# Patient Record
Sex: Female | Born: 1960 | Race: Black or African American | Hispanic: No | Smoking: Current every day smoker
Health system: Southern US, Community
[De-identification: ages and names within clinical notes are randomized; demographics above are authoritative.]

## PROBLEM LIST (undated history)

## (undated) DIAGNOSIS — G5600 Carpal tunnel syndrome, unspecified upper limb: Secondary | ICD-10-CM

## (undated) DIAGNOSIS — J45909 Unspecified asthma, uncomplicated: Secondary | ICD-10-CM

## (undated) DIAGNOSIS — F1020 Alcohol dependence, uncomplicated: Secondary | ICD-10-CM

## (undated) DIAGNOSIS — I10 Essential (primary) hypertension: Secondary | ICD-10-CM

## (undated) HISTORY — PX: INDUCED ABORTION: SHX677

## (undated) HISTORY — DX: Alcohol dependence, uncomplicated: F10.20

---

## 2012-09-12 ENCOUNTER — Encounter (HOSPITAL_COMMUNITY): Payer: Self-pay

## 2012-09-12 ENCOUNTER — Emergency Department (INDEPENDENT_AMBULATORY_CARE_PROVIDER_SITE_OTHER)
Admission: EM | Admit: 2012-09-12 | Discharge: 2012-09-12 | Disposition: A | Discharge status: Home / Self Care | Source: Home / Self Care | Attending: Emergency Medicine | Admitting: Emergency Medicine

## 2012-09-12 DIAGNOSIS — G5601 Carpal tunnel syndrome, right upper limb: Secondary | ICD-10-CM

## 2012-09-12 DIAGNOSIS — G5600 Carpal tunnel syndrome, unspecified upper limb: Secondary | ICD-10-CM

## 2012-09-12 HISTORY — DX: Essential (primary) hypertension: I10

## 2012-09-12 MED ORDER — MELOXICAM 7.5 MG PO TABS: 7.5000 mg | ORAL_TABLET | Freq: Every day | ORAL | Status: DC

## 2012-09-12 NOTE — ED Provider Notes (Addendum)
History     CSN: [id number]  Arrival date & time 09/12/12  1123   First MD Initiated Contact with Patient 09/12/12 1129      Chief Complaint  Patient presents with  . Hand Problem    (Consider location/radiation/quality/duration/timing/severity/associated sxs/prior treatment) HPI Comments: Patient presents urgent care this afternoon describing that after she was carrying a heavy bag of ham in it she says that has been expressing pain and numbness it comes and goes extending from her right wrist to the first 3 fingers. This sensation comes and goes and sometimes will shoot up upwards from her wrist area. She denies any falls, injuries and denies a history of any wrist tendinitis. She does work as a Firefighter. Patient denies any further symptoms such as, fever, chills, bodyaches as in arthralgias or myalgias. Patient also denies any systemic symptoms such as unintentional weight loss or changes in appetite or fever. She has taken a couple of Advil over-the-counter within the last 2 weeks.  Patient is a 52 y.o. female presenting with hand pain. The history is provided by the patient.  Hand Pain This is a new problem. The current episode started more than 1 week ago. The problem occurs constantly. The problem has been gradually worsening. Pertinent negatives include no headaches. Exacerbated by: Flexing and moving her wrist around and making a fist. The symptoms are relieved by rest. She has tried acetaminophen for the symptoms. The treatment provided no relief.    Past Medical History  Diagnosis Date  . Hypertension     History reviewed. No pertinent past surgical history.  History reviewed. No pertinent family history.  History  Substance Use Topics  . Smoking status: Not on file  . Smokeless tobacco: Not on file  . Alcohol Use:     OB History    Grav Para Term Preterm Abortions TAB SAB Ect Mult Living                  Review of Systems  Constitutional: Positive for activity  change. Negative for fever, chills, appetite change, fatigue and unexpected weight change.  Musculoskeletal: Positive for arthralgias. Negative for myalgias, back pain, joint swelling and gait problem.  Skin: Negative for color change, rash and wound.  Neurological: Positive for numbness. Negative for dizziness, tremors, facial asymmetry, weakness and headaches.    Allergies  Azithromycin  Home Medications   Current Outpatient Rx  Name  Route  Sig  Dispense  Refill  . MELOXICAM 7.5 MG PO TABS   Oral   Take 1 tablet (7.5 mg total) by mouth daily. Take one tablet daily for 2 weeks   14 tablet   0     BP 134/88  Pulse 65  Temp 97.8 F (36.6 C) (Oral)  Resp 16  SpO2 100%  Physical Exam  Nursing note and vitals reviewed. Constitutional: Vital signs are normal. She appears well-developed and well-nourished.  Non-toxic appearance. She does not have a sickly appearance.  Musculoskeletal: She exhibits tenderness.       Hands: Neurological: She is alert.  Skin: No rash noted. No erythema.    ED Course  Procedures (including critical care time)  Labs Reviewed - No data to display No results found.   1. Carpal tunnel syndrome of right wrist       MDM  Carpal total syndrome ( non-trauma right hand pain). Patient was splinted with a wrist Velcro splint. Prescribe a course of meloxicam for 2 weeks and in instructed to followup  with a hand orthopedic Dr. for further treatment modalities. Patient has no neuromuscular deficits. As she's able to abduct and adduct her fingers, pulses and capillary refill within normal.     Jimmie Molly, MD 09/12/12 1251  Jimmie Molly, MD 09/12/12 1308  Jimmie Molly, MD 09/12/12 1309

## 2012-09-12 NOTE — ED Notes (Signed)
States she had onset of right hand pain and numbness on 12-24, after carrying ham ; hand pain and numbness comes and goes, sometimes extends to mid upper arm, hard to use her hand due to numbness; no other known issues

## 2013-07-22 ENCOUNTER — Emergency Department (HOSPITAL_COMMUNITY)
Admission: EM | Admit: 2013-07-22 | Discharge: 2013-07-22 | Disposition: A | Discharge status: Home / Self Care | Attending: Emergency Medicine | Admitting: Emergency Medicine

## 2013-07-22 ENCOUNTER — Emergency Department (HOSPITAL_COMMUNITY)

## 2013-07-22 ENCOUNTER — Encounter (HOSPITAL_COMMUNITY): Payer: Self-pay | Admitting: Emergency Medicine

## 2013-07-22 DIAGNOSIS — M5430 Sciatica, unspecified side: Secondary | ICD-10-CM | POA: Insufficient documentation

## 2013-07-22 DIAGNOSIS — R079 Chest pain, unspecified: Secondary | ICD-10-CM | POA: Insufficient documentation

## 2013-07-22 DIAGNOSIS — Z79899 Other long term (current) drug therapy: Secondary | ICD-10-CM | POA: Insufficient documentation

## 2013-07-22 DIAGNOSIS — M5432 Sciatica, left side: Secondary | ICD-10-CM

## 2013-07-22 DIAGNOSIS — F17200 Nicotine dependence, unspecified, uncomplicated: Secondary | ICD-10-CM | POA: Insufficient documentation

## 2013-07-22 DIAGNOSIS — IMO0002 Reserved for concepts with insufficient information to code with codable children: Secondary | ICD-10-CM | POA: Insufficient documentation

## 2013-07-22 DIAGNOSIS — I10 Essential (primary) hypertension: Secondary | ICD-10-CM | POA: Insufficient documentation

## 2013-07-22 MED ORDER — OXYCODONE-ACETAMINOPHEN 5-325 MG PO TABS
1.0000 | ORAL_TABLET | Freq: Once | ORAL | Status: AC
  Administered 2013-07-22: 1 via ORAL
  Filled 2013-07-22: qty 1

## 2013-07-22 MED ORDER — PREDNISONE 5 MG PO TABS: 20.0000 mg | ORAL_TABLET | Freq: Every day | ORAL | Status: DC

## 2013-07-22 MED ORDER — OXYCODONE-ACETAMINOPHEN 5-325 MG PO TABS: 1.0000 | ORAL_TABLET | Freq: Four times a day (QID) | ORAL | Status: DC | PRN

## 2013-07-22 MED ORDER — CYCLOBENZAPRINE HCL 10 MG PO TABS: 10.0000 mg | ORAL_TABLET | Freq: Two times a day (BID) | ORAL | Status: DC | PRN

## 2013-07-22 MED ORDER — PREDNISONE 20 MG PO TABS
60.0000 mg | ORAL_TABLET | Freq: Once | ORAL | Status: AC
  Administered 2013-07-22: 60 mg via ORAL
  Filled 2013-07-22: qty 3

## 2013-07-22 MED ORDER — PREDNISONE 20 MG PO TABS: 60.0000 mg | ORAL_TABLET | Freq: Once | ORAL | Status: DC

## 2013-07-22 MED ORDER — HYDROMORPHONE HCL PF 1 MG/ML IJ SOLN: 1.0000 mg | Freq: Once | INTRAMUSCULAR | Status: DC

## 2013-07-22 MED ORDER — DIAZEPAM 5 MG PO TABS
5.0000 mg | ORAL_TABLET | Freq: Once | ORAL | Status: AC
  Administered 2013-07-22: 5 mg via ORAL
  Filled 2013-07-22: qty 1

## 2013-07-22 NOTE — ED Notes (Signed)
Pt has returned from being out of the department 

## 2013-07-22 NOTE — ED Notes (Signed)
Pt transported to Radiology Department

## 2013-07-22 NOTE — ED Provider Notes (Signed)
CSN: [id number]     Arrival date & time 07/22/13  4098 History   First MD Initiated Contact with Patient 07/22/13 [phone]     Chief Complaint  Patient presents with  . Back Pain   (Consider location/radiation/quality/duration/timing/severity/associated sxs/prior Treatment) Patient is a 52 y.o. female presenting with back pain. The history is provided by the patient. No language interpreter was used.  Back Pain Location:  Sacro-iliac joint Quality:  Stabbing Radiates to:  Does not radiate Pain severity:  Severe Pain is:  Same all the time Duration:  1 week Timing:  Constant Progression:  Worsening Chronicity:  New Context: not falling, not physical stress, not recent illness and not recent injury   Relieved by:  Nothing Associated symptoms: no abdominal pain, no bladder incontinence, no bowel incontinence, no chest pain, no dysuria, no fever, no leg pain, no numbness, no paresthesias, no tingling and no weakness   Risk factors: not obese and no recent surgery     Kelsey Poole is a 52 y.o.female with a significant PMH of hypertension presents to the ER with complaints of back pain x 1 week. Patient states there is no recent injury. Pain is sharp and stabbing worse with any movement, 9/10. Denies any radiating symptoms into legs, no weakness/numbness/tingling, no lose of bowel or bladder control, no recent fever/chills, no recent illness.  No history of back surgery and never had this type of pain before. Needs referral to PCP. She also complains of rib pain that has persisted for the past 5 years. She had it evaluated in 2010 but is afraid she has lung cancer since she is a smoker. She has been homeless for a long time and just recently has a place to live.    Past Medical History  Diagnosis Date  . Hypertension    History reviewed. No pertinent past surgical history. No family history on file. History  Substance Use Topics  . Smoking status: Current Every Day Smoker  . Smokeless  tobacco: Not on file  . Alcohol Use: Yes   OB History   Grav Para Term Preterm Abortions TAB SAB Ect Mult Living                 Review of Systems  Constitutional: Negative for fever.  Cardiovascular: Negative for chest pain.  Gastrointestinal: Negative for abdominal pain and bowel incontinence.  Genitourinary: Negative for bladder incontinence and dysuria.  Musculoskeletal: Positive for back pain.  Neurological: Negative for tingling, weakness, numbness and paresthesias.    Allergies  Azithromycin  Home Medications   Current Outpatient Rx  Name  Route  Sig  Dispense  Refill  . albuterol (PROVENTIL HFA;VENTOLIN HFA) 108 (90 BASE) MCG/ACT inhaler   Inhalation   Inhale 2 puffs into the lungs every 6 (six) hours as needed for wheezing or shortness of breath.         . loratadine (CLARITIN) 10 MG tablet   Oral   Take 10 mg by mouth daily as needed for allergies.         . cyclobenzaprine (FLEXERIL) 10 MG tablet   Oral   Take 1 tablet (10 mg total) by mouth 2 (two) times daily as needed for muscle spasms.   30 tablet   0   . oxyCODONE-acetaminophen (PERCOCET/ROXICET) 5-325 MG per tablet   Oral   Take 1-2 tablets by mouth every 6 (six) hours as needed for severe pain.   15 tablet   0   . predniSONE (DELTASONE) 5 MG  tablet   Oral   Take 4 tablets (20 mg total) by mouth daily.   24 tablet   0     Prednisone dose pack directions:   8 tabs on day ...    BP 153/102  Pulse 68  Temp(Src) 97.7 F (36.5 C) (Oral)  Resp 14  SpO2 100% Physical Exam  Nursing note and vitals reviewed. Constitutional: She appears well-developed and well-nourished. No distress.  HENT:  Head: Normocephalic and atraumatic.  Eyes: Pupils are equal, round, and reactive to light.  Neck: Normal range of motion. Neck supple.  Cardiovascular: Normal rate and regular rhythm.   Pulmonary/Chest: Effort normal.  Abdominal: Soft.  Musculoskeletal:       Lumbar back: She exhibits decreased  range of motion, tenderness, pain and spasm. She exhibits no swelling.       Back:   Equal strength to bilateral lower extremities. Neurosensory function adequate to both legs. Skin color is normal. Skin is warm and moist. I see no step off deformity, no bony tenderness. Pt is able to ambulate without limp. Pain is relieved when sitting in certain positions. ROM is decreased due to pain. No crepitus, laceration, effusion, swelling.  Pulses are normal   Neurological: She is alert.  Skin: Skin is warm and dry.    ED Course  Procedures (including critical care time) Labs Review Labs Reviewed - No data to display Imaging Review Dg Chest 2 View  07/22/2013   CLINICAL DATA:  Left lower anterior chest pain, no injury, history hypertension  EXAM: CHEST  2 VIEW  COMPARISON:  None  FINDINGS: Normal heart size, mediastinal contours, and pulmonary vascularity.  Lungs clear.  Bones unremarkable.  No pneumothorax.  IMPRESSION: Normal exam.   Electronically Signed   By: Ulyses Southward M.D.   On: 07/22/2013 10:24   Dg Lumbar Spine Complete  07/22/2013   CLINICAL DATA:  Left low back pain  EXAM: LUMBAR SPINE - COMPLETE 4+ VIEW  COMPARISON:  None  FINDINGS: 4 non-rib-bearing lumbar type vertebrae with incomplete sacralization of L5.  Vertebral body and disc space heights maintained.  No acute fracture, subluxation or bone destruction.  No spondylolysis.  SI joints symmetric.  Minimal atherosclerotic calcification aorta.  IMPRESSION: No acute osseous findings.   Electronically Signed   By: Ulyses Southward M.D.   On: 07/22/2013 10:25    EKG Interpretation   None       MDM   1. Sciatica neuralgia, left    52 y.o.Kelsey Poole's  with back pain. No neurological deficits and normal neuro exam. Patient can walk but states is painful. No loss of bowel or bladder control. No concern for cauda equina. No fever, night sweats, weight loss, h/o cancer, IVDU. RICE protocol and pain medicine indicated and discussed with  patient.   Patient Plan 1. Medications: narcotic pain medication, muscle relaxer and usual home medications  2. Treatment: rest, drink plenty of fluids, gentle stretching as discussed, alternate ice and heat  3. Follow Up: Please followup with your primary doctor for discussion of your diagnoses and further evaluation after today's visit; if you do not have a primary care doctor use the resource guide provided to find one   Vital signs are stable at discharge. Filed Vitals:   07/22/13 0945  BP: 153/102  Pulse: 68  Temp:   Resp: 14    Patient/guardian has voiced understanding and agreed to follow-up with the PCP or specialist.         Elmarie Shiley  Irine Seal, PA-C 07/22/13 1102

## 2013-07-22 NOTE — ED Notes (Signed)
Patient transported to X-ray 
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
## 2013-07-28 NOTE — ED Provider Notes (Signed)
Medical screening examination/treatment/procedure(s) were performed by non-physician practitioner and as supervising physician I was immediately available for consultation/collaboration.  EKG Interpretation    Date/Time:  Tuesday July 22 2013 09:06:07 EST Ventricular Rate:  77 PR Interval:  134 QRS Duration: 76 QT Interval:  380 QTC Calculation: 430 R Axis:   77 Text Interpretation:  Normal sinus rhythm Normal ECG ED PHYSICIAN INTERPRETATION AVAILABLE IN [facility] Confirmed by TEST, RECORD ([id number]) on 07/24/2013 8:51:06 AM             Juliet Rude. Rubin Payor, MD 07/28/13 1547

## 2014-03-06 ENCOUNTER — Emergency Department (HOSPITAL_COMMUNITY)

## 2014-03-06 ENCOUNTER — Emergency Department (HOSPITAL_COMMUNITY)
Admission: EM | Admit: 2014-03-06 | Discharge: 2014-03-06 | Disposition: A | Discharge status: Home / Self Care | Attending: Emergency Medicine | Admitting: Emergency Medicine

## 2014-03-06 ENCOUNTER — Encounter (HOSPITAL_COMMUNITY): Payer: Self-pay | Admitting: Emergency Medicine

## 2014-03-06 DIAGNOSIS — R1011 Right upper quadrant pain: Secondary | ICD-10-CM | POA: Insufficient documentation

## 2014-03-06 DIAGNOSIS — Z79899 Other long term (current) drug therapy: Secondary | ICD-10-CM | POA: Insufficient documentation

## 2014-03-06 DIAGNOSIS — IMO0002 Reserved for concepts with insufficient information to code with codable children: Secondary | ICD-10-CM | POA: Insufficient documentation

## 2014-03-06 DIAGNOSIS — K2970 Gastritis, unspecified, without bleeding: Secondary | ICD-10-CM

## 2014-03-06 DIAGNOSIS — F17200 Nicotine dependence, unspecified, uncomplicated: Secondary | ICD-10-CM | POA: Insufficient documentation

## 2014-03-06 DIAGNOSIS — I10 Essential (primary) hypertension: Secondary | ICD-10-CM | POA: Insufficient documentation

## 2014-03-06 DIAGNOSIS — Z3202 Encounter for pregnancy test, result negative: Secondary | ICD-10-CM | POA: Insufficient documentation

## 2014-03-06 DIAGNOSIS — N390 Urinary tract infection, site not specified: Secondary | ICD-10-CM | POA: Insufficient documentation

## 2014-03-06 DIAGNOSIS — K2990 Gastroduodenitis, unspecified, without bleeding: Secondary | ICD-10-CM

## 2014-03-06 LAB — URINE MICROSCOPIC-ADD ON

## 2014-03-06 LAB — CBC WITH DIFFERENTIAL/PLATELET
Basophils Absolute: 0 10*3/uL (ref 0.0–0.1)
Basophils Relative: 0 % (ref 0–1)
EOS ABS: 0.1 10*3/uL (ref 0.0–0.7)
EOS PCT: 1 % (ref 0–5)
HCT: 43.1 % (ref 36.0–46.0)
HEMOGLOBIN: 15 g/dL (ref 12.0–15.0)
LYMPHS ABS: 3.4 10*3/uL (ref 0.7–4.0)
Lymphocytes Relative: 30 % (ref 12–46)
MCH: 33.5 pg (ref 26.0–34.0)
MCHC: 34.8 g/dL (ref 30.0–36.0)
MCV: 96.2 fL (ref 78.0–100.0)
MONO ABS: 0.5 10*3/uL (ref 0.1–1.0)
MONOS PCT: 5 % (ref 3–12)
Neutro Abs: 7.5 10*3/uL (ref 1.7–7.7)
Neutrophils Relative %: 64 % (ref 43–77)
Platelets: 268 10*3/uL (ref 150–400)
RBC: 4.48 MIL/uL (ref 3.87–5.11)
RDW: 13.9 % (ref 11.5–15.5)
WBC: 11.5 10*3/uL — ABNORMAL HIGH (ref 4.0–10.5)

## 2014-03-06 LAB — COMPREHENSIVE METABOLIC PANEL
ALK PHOS: 116 U/L (ref 39–117)
ALT: 26 U/L (ref 0–35)
ANION GAP: 20 — AB (ref 5–15)
AST: 36 U/L (ref 0–37)
Albumin: 4.3 g/dL (ref 3.5–5.2)
BUN: 18 mg/dL (ref 6–23)
CALCIUM: 10 mg/dL (ref 8.4–10.5)
CO2: 22 mEq/L (ref 19–32)
Chloride: 94 mEq/L — ABNORMAL LOW (ref 96–112)
Creatinine, Ser: 1.36 mg/dL — ABNORMAL HIGH (ref 0.50–1.10)
GFR calc non Af Amer: 44 mL/min — ABNORMAL LOW (ref >90)
GFR, EST AFRICAN AMERICAN: 50 mL/min — AB (ref >90)
GLUCOSE: 97 mg/dL (ref 70–99)
Potassium: 3.7 mEq/L (ref 3.7–5.3)
Sodium: 136 mEq/L — ABNORMAL LOW (ref 137–147)
TOTAL PROTEIN: 7.9 g/dL (ref 6.0–8.3)
Total Bilirubin: 0.8 mg/dL (ref 0.3–1.2)

## 2014-03-06 LAB — URINALYSIS, ROUTINE W REFLEX MICROSCOPIC
Bilirubin Urine: NEGATIVE
Glucose, UA: NEGATIVE mg/dL
HGB URINE DIPSTICK: NEGATIVE
Ketones, ur: 15 mg/dL — AB
Nitrite: POSITIVE — AB
PROTEIN: NEGATIVE mg/dL
Specific Gravity, Urine: 1.006 (ref 1.005–1.030)
UROBILINOGEN UA: 1 mg/dL (ref 0.0–1.0)
pH: 7 (ref 5.0–8.0)

## 2014-03-06 LAB — POC URINE PREG, ED: PREG TEST UR: NEGATIVE

## 2014-03-06 LAB — LIPASE, BLOOD: Lipase: 126 U/L — ABNORMAL HIGH (ref 11–59)

## 2014-03-06 MED ORDER — IOHEXOL 300 MG/ML  SOLN
100.0000 mL | Freq: Once | INTRAMUSCULAR | Status: AC | PRN
  Administered 2014-03-06: 100 mL via INTRAVENOUS

## 2014-03-06 MED ORDER — OXYCODONE-ACETAMINOPHEN 5-325 MG PO TABS
1.0000 | ORAL_TABLET | Freq: Once | ORAL | Status: AC
  Administered 2014-03-06: 1 via ORAL
  Filled 2014-03-06: qty 1

## 2014-03-06 MED ORDER — SUCRALFATE 1 GM/10ML PO SUSP: 1.0000 g | Freq: Three times a day (TID) | ORAL | Status: DC

## 2014-03-06 MED ORDER — CEPHALEXIN 500 MG PO CAPS: 500.0000 mg | ORAL_CAPSULE | Freq: Two times a day (BID) | ORAL | Status: DC

## 2014-03-06 MED ORDER — SODIUM CHLORIDE 0.9 % IV BOLUS (SEPSIS)
1000.0000 mL | INTRAVENOUS | Status: AC
  Administered 2014-03-06: 1000 mL via INTRAVENOUS

## 2014-03-06 MED ORDER — PANTOPRAZOLE SODIUM 20 MG PO TBEC: 40.0000 mg | DELAYED_RELEASE_TABLET | Freq: Every day | ORAL | Status: DC

## 2014-03-06 MED ORDER — IOHEXOL 300 MG/ML  SOLN: 25.0000 mL | Freq: Once | INTRAMUSCULAR | Status: DC | PRN

## 2014-03-06 MED ORDER — ONDANSETRON HCL 4 MG/2ML IJ SOLN
4.0000 mg | Freq: Once | INTRAMUSCULAR | Status: AC
  Administered 2014-03-06: 4 mg via INTRAVENOUS
  Filled 2014-03-06: qty 2

## 2014-03-06 MED ORDER — ONDANSETRON 4 MG PO TBDP
4.0000 mg | ORAL_TABLET | Freq: Once | ORAL | Status: AC
  Administered 2014-03-06: 4 mg via ORAL
  Filled 2014-03-06: qty 1

## 2014-03-06 MED ORDER — HYDROMORPHONE HCL PF 1 MG/ML IJ SOLN
1.0000 mg | INTRAMUSCULAR | Status: AC
  Administered 2014-03-06: 1 mg via INTRAVENOUS
  Filled 2014-03-06: qty 1

## 2014-03-06 MED ORDER — ONDANSETRON HCL 4 MG PO TABS: 4.0000 mg | ORAL_TABLET | Freq: Four times a day (QID) | ORAL | Status: DC

## 2014-03-06 MED ORDER — DEXTROSE 5 % IV SOLN
1.0000 g | Freq: Once | INTRAVENOUS | Status: AC
  Administered 2014-03-06: 1 g via INTRAVENOUS
  Filled 2014-03-06: qty 10

## 2014-03-06 NOTE — ED Notes (Addendum)
[facility] with c/o abdominal pain associated with nausea and vomiting. Last normal stool on Tuesday, vomited green on Tuesday and clear white on Wednesday. Pt aslo c/o epigastric pain. Per EMS, BP-154/98, HR-93, O2-100% on room air.

## 2014-03-06 NOTE — ED Provider Notes (Signed)
2300 - Patient care assumed from Dr. Purvis SheffieldForrest Harrison at shift change at 2000. Patient with epigastric and RUQ pain. W/u at shift change pending to r/u gallbladder etiology. Patient does have a hx of heavy and consistent ETOH consumption. CT today shows fatty liver with adequate distension of the gallbladder. U/S recommended which was obtained. U/S shows no evidence of acute cholecystitis today.   Patient has been hemodynamically stable throughout ED course without emesis. She is stable for d/c with Rx for Protonix and Carafate as well as Zofran. Suspect symptoms today are secondary to gastritis. ETOH cessation advised. Will tx UTI with Keflex. Return precautions provided and patient agreeable to plan with no unaddressed concerns.  Filed Vitals:   03/06/14 2200 03/06/14 2254 03/06/14 2255 03/06/14 2304  BP: 156/86  106/94 106/70  Pulse: 68     Temp:   98 F (36.7 C) 98 F (36.7 C)  TempSrc:   Oral Oral  Resp: 23  22 20   SpO2: 99% 100% 100% 95%     Antony MaduraKelly Jedrick Hutcherson, PA-C 03/06/14 2308

## 2014-03-06 NOTE — ED Provider Notes (Signed)
CSN: [id number]     Arrival date & time 03/06/14  1849 History   First MD Initiated Contact with Patient 03/06/14 1906     Chief Complaint  Patient presents with  . Abdominal Pain     (Consider location/radiation/quality/duration/timing/severity/associated sxs/prior Treatment) Patient is a 53 y.o. female presenting with abdominal pain. The history is provided by the patient.  Abdominal Pain Pain location:  Epigastric and RUQ Pain quality: aching   Pain radiates to:  Does not radiate Pain severity:  Moderate Onset quality:  Gradual Duration:  3 days Timing:  Constant Progression:  Worsening Chronicity:  New Context comment:  At rest Relieved by:  Nothing Worsened by:  Nothing tried Ineffective treatments:  None tried Associated symptoms: nausea and vomiting   Associated symptoms: no chest pain, no cough, no diarrhea, no dysuria, no fatigue, no fever, no hematuria and no shortness of breath     Past Medical History  Diagnosis Date  . Hypertension    History reviewed. No pertinent past surgical history. History reviewed. No pertinent family history. History  Substance Use Topics  . Smoking status: Current Every Day Smoker  . Smokeless tobacco: Not on file  . Alcohol Use: Yes   OB History   Grav Para Term Preterm Abortions TAB SAB Ect Mult Living                 Review of Systems  Constitutional: Negative for fever and fatigue.  HENT: Negative for congestion and drooling.   Eyes: Negative for pain.  Respiratory: Negative for cough and shortness of breath.   Cardiovascular: Negative for chest pain.  Gastrointestinal: Positive for nausea, vomiting and abdominal pain. Negative for diarrhea.  Genitourinary: Negative for dysuria and hematuria.  Musculoskeletal: Negative for back pain, gait problem and neck pain.  Skin: Negative for color change.  Neurological: Negative for dizziness and headaches.  Hematological: Negative for adenopathy.  Psychiatric/Behavioral:  Negative for behavioral problems.  All other systems reviewed and are negative.     Allergies  Azithromycin  Home Medications   Prior to Admission medications   Medication Sig Start Date End Date Taking? Authorizing Provider  fluticasone (FLOVENT HFA) 110 MCG/ACT inhaler Inhale 1 puff into the lungs 2 (two) times daily.   Yes Historical Provider, MD  lisinopril (PRINIVIL,ZESTRIL) 10 MG tablet Take 10 mg by mouth daily.   Yes Historical Provider, MD   There were no vitals taken for this visit. Physical Exam  Nursing note and vitals reviewed. Constitutional: She is oriented to person, place, and time. She appears well-developed and well-nourished.  HENT:  Head: Normocephalic.  Mouth/Throat: Oropharynx is clear and moist. No oropharyngeal exudate.  Eyes: Conjunctivae and EOM are normal. Pupils are equal, round, and reactive to light.  Neck: Normal range of motion. Neck supple.  Cardiovascular: Normal rate, regular rhythm, normal heart sounds and intact distal pulses.  Exam reveals no gallop and no friction rub.   No murmur heard. Pulmonary/Chest: Effort normal and breath sounds normal. No respiratory distress. She has no wheezes.  Abdominal: Soft. Bowel sounds are normal. There is tenderness (mild to moderate tenderness to palpation of the right upper quadrant and epigastric area.). There is no rebound and no guarding.  Musculoskeletal: Normal range of motion. She exhibits no edema and no tenderness.  Neurological: She is alert and oriented to person, place, and time.  Skin: Skin is warm and dry.  Psychiatric: She has a normal mood and affect. Her behavior is normal.    ED  Course  Procedures (including critical care time) Labs Review Labs Reviewed  CBC WITH DIFFERENTIAL - Abnormal; Notable for the following:    WBC 11.5 (*)    All other components within normal limits  COMPREHENSIVE METABOLIC PANEL - Abnormal; Notable for the following:    Sodium 136 (*)    Chloride 94 (*)     Creatinine, Ser 1.36 (*)    GFR calc non Af Amer 44 (*)    GFR calc Af Amer 50 (*)    Anion gap 20 (*)    All other components within normal limits  LIPASE, BLOOD - Abnormal; Notable for the following:    Lipase 126 (*)    All other components within normal limits  URINALYSIS, ROUTINE W REFLEX MICROSCOPIC - Abnormal; Notable for the following:    APPearance CLOUDY (*)    Ketones, ur 15 (*)    Nitrite POSITIVE (*)    Leukocytes, UA MODERATE (*)    All other components within normal limits  URINE MICROSCOPIC-ADD ON - Abnormal; Notable for the following:    Squamous Epithelial / LPF FEW (*)    Bacteria, UA MANY (*)    All other components within normal limits  POC URINE PREG, ED    Imaging Review [place] Abdomen Complete  03/06/2014   CLINICAL DATA:  Right upper quadrant pain. Evaluate for cholecystitis.  EXAM: ULTRASOUND ABDOMEN COMPLETE  COMPARISON:  None.  FINDINGS: Gallbladder:  No gallstones or wall thickening visualized. No sonographic Murphy sign noted.  Common bile duct:  Diameter: 5 mm.  Liver:  Mild increased echogenicity diffusely. No focal abnormality. Antegrade flow in the imaged portal venous system.  IVC:  No abnormality visualized.  Pancreas:  Visualized portion unremarkable.  Spleen:  Size and appearance within normal limits.  Right Kidney:  Length: 10 cm. Echogenicity within normal limits. No mass or hydronephrosis visualized.  Left Kidney:  Length: 10 cm. Echogenicity within normal limits. No mass or hydronephrosis visualized.  Abdominal aorta:  No aneurysm visualized.  Other findings:  None.  IMPRESSION: Mild hepatic steatosis.  Otherwise, negative exam.   Electronically Signed   By: Tiburcio PeaJonathan  Watts M.D.   On: 03/06/2014 22:38   Ct Abdomen Pelvis W Contrast  03/06/2014   CLINICAL DATA:  Epigastric and right upper quadrant discomfort  EXAM: CT ABDOMEN AND PELVIS WITH CONTRAST  TECHNIQUE: Multidetector CT imaging of the abdomen and pelvis was performed using the standard protocol  following bolus administration of intravenous contrast.  CONTRAST:  100mL OMNIPAQUE IOHEXOL 300 MG/ML SOLN intravenously; the patient also received oral contrast material.  COMPARISON:  None.  FINDINGS: The liver exhibits decreased density with no focal mass or ductal dilation. The gallbladder is adequately distended with no evidence of stones. The pancreas, spleen, adrenal glands, and kidneys are normal. The abdominal aorta is normal. The urinary bladder, uterus, and adnexal structures are normal for age. There is no ascites.  The stomach is moderately distended with the oral contrast. Only a small amount has entered the small bowel. There is no small or large bowel obstructive pattern. There is a normal appendix. There is no evidence of colitis. There are scattered sigmoid diverticula.  The lumbar spine and pelvis are unremarkable. The lung bases are clear.  IMPRESSION: 1. There are fatty infiltrative changes of the liver. No acute hepatobiliary abnormality is demonstrated. If there are clinical concerns of acute cholecystitis, gallbladder ultrasound would be useful. 2. There is no acute urinary tract or acute bowel abnormality. 3. There is  no ascites nor lymphadenopathy.   Electronically Signed   By: David  SwazilandJordan   On: 03/06/2014 21:04     EKG Interpretation None      MDM   Final diagnoses:  Gastritis  UTI (lower urinary tract infection)    7:20 PM 53 y.o. female who presents with upper abdominal pain which began 2 days ago after taking a Prevacid. She notes ongoing cramping pain as well as nausea and vomiting. She denies any diarrhea and states that she has not had a bowel movement in 2-3 days. She has had green and clear-colored emesis. She is afebrile and vital signs are unremarkable here. Will get labs and imaging.  Pt drinks several 40's daily, has not drank since Wednesday.   8:30 PM [facility] while [facility] imaging.   Junius ArgyleForrest S Eleni Frank, MD 03/07/14 [id number]

## 2014-03-06 NOTE — Discharge Instructions (Signed)
Take Protonix for symptoms and Carafate as needed for persistent discomfort. Take Keflex for a urinary tract infection. Follow up with a primary care doctor to ensure symptoms resolve. You may take Zofran as needed for nausea/vomiting. Recommend that you discontinue, or at least decrease, your daily alcohol intake as this may be causing your worsening symptoms.   Gastritis, Adult Gastritis is soreness and swelling (inflammation) of the lining of the stomach. Gastritis can develop as a sudden onset (acute) or long-term (chronic) condition. If gastritis is not treated, it can lead to stomach bleeding and ulcers. CAUSES  Gastritis occurs when the stomach lining is weak or damaged. Digestive juices from the stomach then inflame the weakened stomach lining. The stomach lining may be weak or damaged due to viral or bacterial infections. One common bacterial infection is the Helicobacter pylori infection. Gastritis can also result from excessive alcohol consumption, taking certain medicines, or having too much acid in the stomach.  SYMPTOMS  In some cases, there are no symptoms. When symptoms are present, they may include:  Pain or a burning sensation in the upper abdomen.  Nausea.  Vomiting.  An uncomfortable feeling of fullness after eating. DIAGNOSIS  Your caregiver may suspect you have gastritis based on your symptoms and a physical exam. To determine the cause of your gastritis, your caregiver may perform the following:  Blood or stool tests to check for the H pylori bacterium.  Gastroscopy. A thin, flexible tube (endoscope) is passed down the esophagus and into the stomach. The endoscope has a light and camera on the end. Your caregiver uses the endoscope to view the inside of the stomach.  Taking a tissue sample (biopsy) from the stomach to examine under a microscope. TREATMENT  Depending on the cause of your gastritis, medicines may be prescribed. If you have a bacterial infection, such as  an H pylori infection, antibiotics may be given. If your gastritis is caused by too much acid in the stomach, H2 blockers or antacids may be given. Your caregiver may recommend that you stop taking aspirin, ibuprofen, or other nonsteroidal anti-inflammatory drugs (NSAIDs). HOME CARE INSTRUCTIONS  Only take over-the-counter or prescription medicines as directed by your caregiver.  If you were given antibiotic medicines, take them as directed. Finish them even if you start to feel better.  Drink enough fluids to keep your urine clear or pale yellow.  Avoid foods and drinks that make your symptoms worse, such as:  Caffeine or alcoholic drinks.  Chocolate.  Peppermint or mint flavorings.  Garlic and onions.  Spicy foods.  Citrus fruits, such as oranges, lemons, or limes.  Tomato-based foods such as sauce, chili, salsa, and pizza.  Fried and fatty foods.  Eat small, frequent meals instead of large meals. SEEK IMMEDIATE MEDICAL CARE IF:   You have black or dark red stools.  You vomit blood or material that looks like coffee grounds.  You are unable to keep fluids down.  Your abdominal pain gets worse.  You have a fever.  You do not feel better after 1 week.  You have any other questions or concerns. MAKE SURE YOU:  Understand these instructions.  Will watch your condition.  Will get help right away if you are not doing well or get worse. Document Released: 08/15/2001 Document Revised: 02/20/2012 Document Reviewed: 10/04/2011 [facility] Patient Information 2015 [place], [place]. This information is not intended to replace advice given to you by your health care provider. Make sure you discuss any questions you have with  your health care provider. Urinary Tract Infection A urinary tract infection (UTI) can occur any place along the urinary tract. The tract includes the kidneys, ureters, bladder, and urethra. A type of germ called bacteria often causes a UTI. UTIs are often  helped with antibiotic medicine.  HOME CARE   If given, take antibiotics as told by your doctor. Finish them even if you start to feel better.  Drink enough fluids to keep your pee (urine) clear or pale yellow.  Avoid tea, drinks with caffeine, and bubbly (carbonated) drinks.  Pee often. Avoid holding your pee in for a long time.  Pee before and after having sex (intercourse).  Wipe from front to back after you poop (bowel movement) if you are a woman. Use each tissue only once. GET HELP RIGHT AWAY IF:   You have back pain.  You have lower belly (abdominal) pain.  You have chills.  You feel sick to your stomach (nauseous).  You throw up (vomit).  Your burning or discomfort with peeing does not go away.  You have a fever.  Your symptoms are not better in 3 days. MAKE SURE YOU:   Understand these instructions.  Will watch your condition.  Will get help right away if you are not doing well or get worse. Document Released: 02/07/2008 Document Revised: 05/15/2012 Document Reviewed: 03/21/2012 [facility] Patient Information 2015 [place], [place]. This information is not intended to replace advice given to you by your health care provider. Make sure you discuss any questions you have with your health care provider.

## 2014-03-06 NOTE — ED Notes (Signed)
Patient transported to CT 

## 2014-03-06 NOTE — ED Notes (Signed)
Pt back from CT, report given to GoltryPaul.

## 2014-03-07 NOTE — ED Provider Notes (Signed)
Medical screening examination/treatment/procedure(s) were performed by non-physician practitioner and as supervising physician I was immediately available for consultation/collaboration.   Jaide Hillenburg, MD 03/07/14 0034 

## 2014-10-03 ENCOUNTER — Emergency Department (HOSPITAL_COMMUNITY)
Admission: EM | Admit: 2014-10-03 | Discharge: 2014-10-03 | Disposition: A | Discharge status: Home / Self Care | Attending: Emergency Medicine | Admitting: Emergency Medicine

## 2014-10-03 ENCOUNTER — Encounter (HOSPITAL_COMMUNITY): Payer: Self-pay | Admitting: Emergency Medicine

## 2014-10-03 DIAGNOSIS — Y998 Other external cause status: Secondary | ICD-10-CM | POA: Insufficient documentation

## 2014-10-03 DIAGNOSIS — W009XXA Unspecified fall due to ice and snow, initial encounter: Secondary | ICD-10-CM | POA: Insufficient documentation

## 2014-10-03 DIAGNOSIS — S0990XA Unspecified injury of head, initial encounter: Secondary | ICD-10-CM | POA: Diagnosis present

## 2014-10-03 DIAGNOSIS — Z79899 Other long term (current) drug therapy: Secondary | ICD-10-CM | POA: Diagnosis not present

## 2014-10-03 DIAGNOSIS — Z720 Tobacco use: Secondary | ICD-10-CM | POA: Diagnosis not present

## 2014-10-03 DIAGNOSIS — S060X0A Concussion without loss of consciousness, initial encounter: Secondary | ICD-10-CM

## 2014-10-03 DIAGNOSIS — Z7951 Long term (current) use of inhaled steroids: Secondary | ICD-10-CM | POA: Insufficient documentation

## 2014-10-03 DIAGNOSIS — Y9389 Activity, other specified: Secondary | ICD-10-CM | POA: Diagnosis not present

## 2014-10-03 DIAGNOSIS — I10 Essential (primary) hypertension: Secondary | ICD-10-CM | POA: Insufficient documentation

## 2014-10-03 DIAGNOSIS — Y9289 Other specified places as the place of occurrence of the external cause: Secondary | ICD-10-CM | POA: Diagnosis not present

## 2014-10-03 DIAGNOSIS — A64 Unspecified sexually transmitted disease: Secondary | ICD-10-CM | POA: Diagnosis not present

## 2014-10-03 LAB — WET PREP, GENITAL
CLUE CELLS WET PREP: NONE SEEN
Trich, Wet Prep: NONE SEEN
WBC WET PREP: NONE SEEN
YEAST WET PREP: NONE SEEN

## 2014-10-03 LAB — I-STAT BETA HCG BLOOD, ED (MC, WL, AP ONLY): I-stat hCG, quantitative: <5 m[IU]/mL (ref <5)

## 2014-10-03 MED ORDER — OXYCODONE-ACETAMINOPHEN 5-325 MG PO TABS
2.0000 | ORAL_TABLET | Freq: Once | ORAL | Status: AC
  Administered 2014-10-03: 2 via ORAL
  Filled 2014-10-03: qty 2

## 2014-10-03 NOTE — Discharge Instructions (Signed)

## 2014-10-03 NOTE — ED Provider Notes (Signed)
CSN: [id number]     Arrival date & time 10/03/14  1430 History   First MD Initiated Contact with Patient 10/03/14 1620     Chief Complaint  Patient presents with  . Fall  . Headache  . SEXUALLY TRANSMITTED DISEASE     Patient is a 54 y.o. female presenting with headaches. The history is provided by the patient.  Headache Pain location:  R temporal and L temporal Quality:  Sharp Radiates to:  Does not radiate Onset quality:  Sudden Duration:  5 days Timing:  Constant Progression:  Unchanged Chronicity:  New Context comment:  Fall on ice Relieved by:  Nothing Worsened by:  Nothing tried Associated symptoms: no abdominal pain, no back pain, no fever, no focal weakness, no neck pain and no vomiting   Patient reports she has HA for 5 days after falling on "black ice"  She reports hitting her head but no LOC No vomiting She had some neck and back pain initially but that has resolved  She also reports recent vaginal discharge and reports she would like an STD check She denies abd pain No dysuria   She also admits to cocaine use recently, reports she currently regrets this and does not plan on repeating this behavior  Past Medical History  Diagnosis Date  . Hypertension    History reviewed. No pertinent past surgical history. No family history on file. History  Substance Use Topics  . Smoking status: Current Every Day Smoker  . Smokeless tobacco: Not on file  . Alcohol Use: Yes   OB History    No data available     Review of Systems  Constitutional: Negative for fever.  Gastrointestinal: Negative for vomiting and abdominal pain.  Genitourinary: Positive for vaginal discharge. Negative for dysuria.  Musculoskeletal: Negative for back pain and neck pain.  Neurological: Positive for headaches. Negative for focal weakness and weakness.  All other systems reviewed and are negative.     Allergies  Azithromycin  Home Medications   Prior to Admission medications    Medication Sig Start Date End Date Taking? Authorizing Provider  fluticasone (FLOVENT HFA) 110 MCG/ACT inhaler Inhale 1 puff into the lungs 2 (two) times daily.    Historical Provider, MD  lisinopril (PRINIVIL,ZESTRIL) 10 MG tablet Take 10 mg by mouth daily.    Historical Provider, MD  pantoprazole (PROTONIX) 20 MG tablet Take 2 tablets (40 mg total) by mouth daily. 03/06/14   Antony Madura, PA-C   BP 123/77 mmHg  Pulse 64  Temp(Src) 97.4 F (36.3 C) (Oral)  Resp 16  Ht  (1.803 m)  Wt 183 lb (83.008 kg)  BMI 25.53 kg/m2  SpO2 100% Physical Exam CONSTITUTIONAL: Well developed/well nourished HEAD: Normocephalic/atraumatic EYES: EOMI/PERRL, no nystagmus, no ptosis ENMT: Mucous membranes moist, No evidence of facial/nasal trauma NECK: supple no meningeal signs, no bruits SPINE/BACK:entire spine nontender, No bruising/crepitance/stepoffs noted to spine CV: S1/S2 noted, no murmurs/rubs/gallops noted LUNGS: Lungs are clear to auscultation bilaterally, no apparent distress ABDOMEN: soft, nontender, no rebound or guarding GU:no cva tenderness. No vaginal bleeding/discharge.  No CMT noted.  Female chaperone present NEURO:Awake/alert, facies symmetric, no arm or leg drift is noted Equal 5/5 strength with shoulder abduction, elbow flex/extension, wrist flex/extension in upper extremities and equal hand grips bilaterally Cranial nerves 3/4/5/6/03/12/09/11/12 tested and intact Gait normal without ataxia No past pointing Sensation to light touch intact in all extremities EXTREMITIES: pulses normal, full ROM SKIN: warm, color normal PSYCH: no abnormalities of mood noted, alert  and oriented to situation   ED Course  Procedures  5:09 PM For HA - likely concussion, denies LOC/vomiting/weakness.  Defer imaging at this time Advised her to discontinue her cocaine abuse Wet prep is pending 5:38 PM Wet prep is negative She is well appearing, eating [employer]   Labs Review Labs Reviewed  WET  PREP, GENITAL  RPR  HIV ANTIBODY (ROUTINE TESTING)  I-STAT BETA HCG BLOOD, ED ([facility], [facility], [facility] ONLY)  GC/CHLAMYDIA PROBE AMP ([facility])    Medications  oxyCODONE-acetaminophen (PERCOCET/ROXICET) 5-325 MG per tablet 2 tablet (2 tablets Oral Given 10/03/14 1635)     MDM   Final diagnoses:  Concussion, without loss of consciousness, initial encounter    Nursing notes including past medical history and social history reviewed and considered in documentation Labs/vital reviewed myself and considered during evaluation     Joya Gaskinsonald W Kenedy Haisley, MD 10/03/14 1740

## 2014-10-03 NOTE — ED Notes (Signed)
Last cocaine was last night 40 dollars worth

## 2014-10-03 NOTE — ED Notes (Signed)
Pt. Stated, i fell and hit my head on Tuesday on the ice.  I also want to be checked for STD.  I hurt my back and head.  My back is better but my head still hurts.

## 2014-10-04 NOTE — Progress Notes (Signed)
ED CM received call from patient to report a generalized itching after receiving Percocet in the ED 10/03/14. Allergies updated in patient's record. No further CM needs identified

## 2014-10-05 LAB — GC/CHLAMYDIA PROBE AMP (~~LOC~~) NOT AT ARMC
Chlamydia: NEGATIVE
Neisseria Gonorrhea: NEGATIVE

## 2014-10-06 LAB — HIV ANTIBODY (ROUTINE TESTING W REFLEX): HIV Screen 4th Generation wRfx: NONREACTIVE

## 2014-10-09 LAB — RPR: RPR Ser Ql: NONREACTIVE

## 2014-10-12 ENCOUNTER — Emergency Department (HOSPITAL_COMMUNITY)
Admission: EM | Admit: 2014-10-12 | Discharge: 2014-10-12 | Disposition: A | Discharge status: Home / Self Care | Attending: Emergency Medicine | Admitting: Emergency Medicine

## 2014-10-12 ENCOUNTER — Encounter (HOSPITAL_COMMUNITY): Payer: Self-pay | Admitting: Emergency Medicine

## 2014-10-12 ENCOUNTER — Emergency Department (HOSPITAL_COMMUNITY)

## 2014-10-12 DIAGNOSIS — I10 Essential (primary) hypertension: Secondary | ICD-10-CM | POA: Diagnosis not present

## 2014-10-12 DIAGNOSIS — Z720 Tobacco use: Secondary | ICD-10-CM | POA: Insufficient documentation

## 2014-10-12 DIAGNOSIS — Z791 Long term (current) use of non-steroidal anti-inflammatories (NSAID): Secondary | ICD-10-CM | POA: Diagnosis not present

## 2014-10-12 DIAGNOSIS — R51 Headache: Secondary | ICD-10-CM | POA: Diagnosis not present

## 2014-10-12 DIAGNOSIS — Z79899 Other long term (current) drug therapy: Secondary | ICD-10-CM | POA: Diagnosis not present

## 2014-10-12 DIAGNOSIS — F0781 Postconcussional syndrome: Secondary | ICD-10-CM | POA: Diagnosis not present

## 2014-10-12 MED ORDER — DEXAMETHASONE SODIUM PHOSPHATE 10 MG/ML IJ SOLN
10.0000 mg | Freq: Once | INTRAMUSCULAR | Status: AC
  Administered 2014-10-12: 10 mg via INTRAVENOUS
  Filled 2014-10-12: qty 1

## 2014-10-12 MED ORDER — HYDROCODONE-ACETAMINOPHEN 5-325 MG PO TABS: 2.0000 | ORAL_TABLET | Freq: Once | ORAL | Status: DC

## 2014-10-12 MED ORDER — METOCLOPRAMIDE HCL 5 MG/ML IJ SOLN
10.0000 mg | Freq: Once | INTRAMUSCULAR | Status: AC
  Administered 2014-10-12: 10 mg via INTRAVENOUS
  Filled 2014-10-12: qty 2

## 2014-10-12 MED ORDER — DIPHENHYDRAMINE HCL 50 MG/ML IJ SOLN
25.0000 mg | Freq: Once | INTRAMUSCULAR | Status: AC
  Administered 2014-10-12: 25 mg via INTRAVENOUS
  Filled 2014-10-12: qty 1

## 2014-10-12 MED ORDER — BUTALBITAL-APAP-CAFFEINE 50-325-40 MG PO TABS: 1.0000 | ORAL_TABLET | Freq: Four times a day (QID) | ORAL | Status: DC | PRN

## 2014-10-12 NOTE — Discharge Instructions (Signed)
Read the information below.  Use the prescribed medication as directed.  Please discuss all new medications with your pharmacist.  Do not take additional tylenol while taking the prescribed pain medication to avoid overdose.  You may return to the Emergency Department at any time for worsening condition or any new symptoms that concern you.  If there is any possibility that you might be pregnant, please let your health care provider know and discuss this with the pharmacist to ensure medication safety.   ° ° °SEEK MEDICAL ATTENTION IF: °You develop possible problems with medications prescribed.  °The medications don't resolve your headache, if it recurs , or if you have multiple episodes of vomiting or can't take fluids. °You have a change from the usual headache. °RETURN IMMEDIATELY IF you develop a sudden, severe headache or confusion, become poorly responsive or faint, develop a fever above 100.4F or problem breathing, have a change in speech, vision, swallowing, or understanding, or develop new weakness, numbness, tingling, incoordination, or have a seizure.  °

## 2014-10-12 NOTE — ED Notes (Signed)
Pt had fall last week with continued HA and light sensitivity

## 2014-10-12 NOTE — ED Notes (Signed)
Pt. Refused wheelchair and left with all belongings. Pt and Significant other refused security escort out to car.

## 2014-10-12 NOTE — ED Provider Notes (Signed)
CSN: [id number]     Arrival date & time 10/12/14  1244 History   First MD Initiated Contact with Patient 10/12/14 1646     Chief Complaint  Patient presents with  . Fall  . Headache     (Consider location/radiation/quality/duration/timing/severity/associated sxs/prior Treatment) HPI   Patient presents with left sided headache x 1 week since slip and fall resulting in concussion.  Seen in ED 10/03/14 for same.  Headache is throbbing, located over left temple.  Associated photophobia, excessive sleeping.  She fell a few days ago, slipped and fell in the shower, hitting the back of her head on the shower.  NO LOC.  Denies vision changes, weakness or numbness of the arms or legs, gait abnormalities.    Past Medical History  Diagnosis Date  . Hypertension    History reviewed. No pertinent past surgical history. History reviewed. No pertinent family history. History  Substance Use Topics  . Smoking status: Current Every Day Smoker  . Smokeless tobacco: Not on file  . Alcohol Use: Yes   OB History    No data available     Review of Systems  All other systems reviewed and are negative.     Allergies  Percocet and Azithromycin  Home Medications   Prior to Admission medications   Medication Sig Start Date End Date Taking? Authorizing Provider  Albuterol Sulfate 108 (90 BASE) MCG/ACT AEPB Inhale 1 spray into the lungs daily as needed (asthma).   Yes Historical Provider, MD  BAYER ASPIRIN PO Take 2 tablets by mouth daily as needed (headache).   Yes Historical Provider, MD  lisinopril (PRINIVIL,ZESTRIL) 10 MG tablet Take 10 mg by mouth daily.   Yes Historical Provider, MD  naproxen sodium (ANAPROX) 220 MG tablet Take 220 mg by mouth daily as needed (headache).   Yes Historical Provider, MD  pantoprazole (PROTONIX) 20 MG tablet Take 2 tablets (40 mg total) by mouth daily. Patient not taking: Reported on 10/12/2014 03/06/14   Antony MaduraKelly Humes, PA-C   BP 180/97 mmHg  Pulse 80  Temp(Src)  98.3 F (36.8 C) (Oral)  Resp 16  Ht 5\' 11"  (1.803 m)  Wt 183 lb (83.008 kg)  BMI 25.53 kg/m2  SpO2 94% Physical Exam  Constitutional: She appears well-developed and well-nourished. No distress.  HENT:  Head: Normocephalic and atraumatic.  Neck: Normal range of motion. Neck supple.  Cardiovascular: Normal rate and regular rhythm.   Pulmonary/Chest: Effort normal and breath sounds normal. No respiratory distress. She has no wheezes. She has no rales.  Abdominal: Soft. She exhibits no distension. There is no tenderness. There is no rebound and no guarding.  Neurological: She is alert.  CN II-XII intact, EOMs intact, no pronator drift, grip strengths equal bilaterally; strength 5/5 in all extremities, sensation intact in all extremities; finger to nose, heel to shin, rapid alternating movements normal; gait is normal.     Skin: She is not diaphoretic.  Nursing note and vitals reviewed.   ED Course  Procedures (including critical care time) Labs Review Labs Reviewed - No data to display  Imaging Review Ct Head Wo Contrast  10/12/2014   CLINICAL DATA:  Fall last week, acute injury, persistent headache.  EXAM: CT HEAD WITHOUT CONTRAST  TECHNIQUE: Contiguous axial images were obtained from the base of the skull through the vertex without contrast.  COMPARISON:  None  FINDINGS: Normal appearance of the intracranial structures. No evidence for acute hemorrhage, mass lesion, midline shift, hydrocephalus or large infarct. No acute bony  abnormality. The visualized sinuses are clear.  IMPRESSION: No acute intracranial abnormality.   Electronically Signed   By: Ruel Favors M.D.   On: 10/12/2014 19:17     EKG Interpretation None       5:57 PM Discussed pt with Dr Madilyn Hook.   MDM   Final diagnoses:  Postconcussion syndrome    Afebrile, nontoxic patient with persistent headache, sleepiness, photophobia following concussion.  Neurologic exam is normal.  CT head negative.  Pt feeling relief  after migraine cocktail.   D/C home with PCP follow up, fioricet for pain.  Discussed result, findings, treatment, and follow up  with patient.  Pt given return precautions.  Pt verbalizes understanding and agrees with plan.         Trixie Dredge, PA-C 10/12/14 1947  Tilden Fossa, MD 10/12/14 2121

## 2014-10-12 NOTE — ED Notes (Signed)
Significant harassing staff for information about a pt. Pt. Significant other told that pt. Cannot be going from pods asking for a particular pt. Security escorted significant other to lobby, and told pt. That he left campus.

## 2015-10-25 ENCOUNTER — Emergency Department (HOSPITAL_COMMUNITY)
Admission: EM | Admit: 2015-10-25 | Discharge: 2015-10-25 | Disposition: A | Discharge status: Home / Self Care | Attending: Emergency Medicine | Admitting: Emergency Medicine

## 2015-10-25 ENCOUNTER — Encounter (HOSPITAL_COMMUNITY): Payer: Self-pay

## 2015-10-25 DIAGNOSIS — I10 Essential (primary) hypertension: Secondary | ICD-10-CM | POA: Diagnosis not present

## 2015-10-25 DIAGNOSIS — M79641 Pain in right hand: Secondary | ICD-10-CM | POA: Diagnosis present

## 2015-10-25 DIAGNOSIS — Z79899 Other long term (current) drug therapy: Secondary | ICD-10-CM | POA: Insufficient documentation

## 2015-10-25 DIAGNOSIS — R200 Anesthesia of skin: Secondary | ICD-10-CM | POA: Diagnosis not present

## 2015-10-25 DIAGNOSIS — M25561 Pain in right knee: Secondary | ICD-10-CM | POA: Insufficient documentation

## 2015-10-25 DIAGNOSIS — R202 Paresthesia of skin: Secondary | ICD-10-CM | POA: Diagnosis not present

## 2015-10-25 DIAGNOSIS — F17200 Nicotine dependence, unspecified, uncomplicated: Secondary | ICD-10-CM | POA: Diagnosis not present

## 2015-10-25 DIAGNOSIS — G5601 Carpal tunnel syndrome, right upper limb: Secondary | ICD-10-CM | POA: Diagnosis not present

## 2015-10-25 DIAGNOSIS — M25562 Pain in left knee: Secondary | ICD-10-CM | POA: Diagnosis not present

## 2015-10-25 MED ORDER — NAPROXEN 500 MG PO TABS
500.0000 mg | ORAL_TABLET | Freq: Two times a day (BID) | ORAL | Status: DC
Start: 2015-10-25 — End: 2017-04-19

## 2015-10-25 NOTE — Discharge Instructions (Signed)
1. Medications: Take naproxen (anti-inflammatory) as directed for the next 3-4 days then just as needed for pain, continue usual home medications 2. Treatment: rest, drink plenty of fluids, wear wrist splint until symptoms improve, ice affected area (directions below)  3. Follow Up: Please follow up with the orthopedic clinic listed above if no improvement in symptoms after 7 days for discussion of your diagnoses and further evaluation after today's visit; Please return to the ER for new or worsening symptoms, any additional concerns.   COLD THERAPY DIRECTIONS:  Ice or gel packs can be used to reduce both pain and swelling. Ice is the most helpful within the first 24 to 48 hours after an injury or flareup from overusing a muscle or joint.  Ice is effective, has very few side effects, and is safe for most people to use.   If you expose your skin to cold temperatures for too long or without the proper protection, you can damage your skin or nerves. Watch for signs of skin damage due to cold.   HOME CARE INSTRUCTIONS  Follow these tips to use ice and cold packs safely.  Place a dry or damp towel between the ice and skin. A damp towel will cool the skin more quickly, so you may need to shorten the time that the ice is used.  For a more rapid response, add gentle compression to the ice.  Ice for no more than 10 to 20 minutes at a time. The bonier the area you are icing, the less time it will take to get the benefits of ice.  Check your skin after 5 minutes to make sure there are no signs of a poor response to cold or skin damage.  Rest 20 minutes or more in between uses.  Once your skin is numb, you can end your treatment. You can test numbness by very lightly touching your skin. The touch should be so light that you do not see the skin dimple from the pressure of your fingertip. When using ice, most people will feel these normal sensations in this order: cold, burning, aching, and numbness.

## 2015-10-25 NOTE — ED Notes (Signed)
Declined W/C at D/C and was escorted to lobby by RN. 

## 2015-10-25 NOTE — ED Notes (Signed)
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 Having some swelling and numbness to hands and knees, right hand and knee worse. Thinks related to making donuts

## 2015-10-25 NOTE — ED Provider Notes (Signed)
CSN: [id number]     Arrival date & time 10/25/15  0820 History  By signing my name below, I, Ronney Lion, attest that this documentation has been prepared under the direction and in the presence of [facility], PA-C. Electronically Signed: Ronney Lion, ED Scribe. 10/25/2015. 4:38 PM.    Chief Complaint  Patient presents with  . hand and knee pain    The history is provided by the patient. No language interpreter was used.    HPI Comments: Kelsey Poole is a 55 y.o. female with a history of hypertension, who is right-hand-dominant, who presents to the Emergency Department complaining of gradual-onset, constant, worsening, moderate pain, swelling, and numbness to her right hands that began about 1 month ago since she started working at a new job requiring repetitive hand motions. She also complains of swelling and pain in her bilateral knees, right greater than left. She notes intermittent tingling in her hands that is alleviated by "beating on" her right hand. 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 She notes a history of carpal tunnel syndrome although she states this pain is worse than  previous carpal tunnel pain, which radiated up her forearm. Patient states she has always had a "bump" in her right knee, where she was told her "bones didn't fuse together." No medications taken PTA.   Past Medical History  Diagnosis Date  . Hypertension    History reviewed. No pertinent past surgical history. No family history on file. Social History  Substance Use Topics  . Smoking status: Current Every Day Smoker  . Smokeless tobacco: None  . Alcohol Use: Yes   OB History    No data available     Review of Systems  Constitutional: Negative for fever.  Respiratory: Negative for shortness of breath.   Cardiovascular: Negative for chest pain.  Musculoskeletal: Positive for joint swelling (right wrist/hand; bilateral knees, right greater  than left) and arthralgias (right wrist/hand; bilateral knees, right greater than left).  Skin: Negative for rash and wound.     Allergies  Percocet and Azithromycin  Home Medications   Prior to Admission medications   Medication Sig Start Date End Date Taking? Authorizing Provider  Albuterol Sulfate 108 (90 BASE) MCG/ACT AEPB Inhale 1 spray into the lungs daily as needed (asthma).    Historical Provider, MD  BAYER ASPIRIN PO Take 2 tablets by mouth daily as needed (headache).    Historical Provider, MD  butalbital-acetaminophen-caffeine (FIORICET) 50-325-40 MG per tablet Take 1 tablet by mouth every 6 (six) hours as needed for headache. 10/12/14   Trixie Dredge, PA-C  lisinopril (PRINIVIL,ZESTRIL) 10 MG tablet Take 10 mg by mouth daily.    Historical Provider, MD  naproxen (NAPROSYN) 500 MG tablet Take 1 tablet (500 mg total) by mouth 2 (two) times daily. 10/25/15   Chase Picket Julane Crock, PA-C  naproxen sodium (ANAPROX) 220 MG tablet Take 220 mg by mouth daily as needed (headache).    Historical Provider, MD  pantoprazole (PROTONIX) 20 MG tablet Take 2 tablets (40 mg total) by mouth daily. Patient not taking: Reported on 10/12/2014 03/06/14   Antony Madura, PA-C   BP 165/94 mmHg  Pulse 84  Temp(Src) 98.5 F (36.9 C) (Oral)  Resp 16  SpO2 99% Physical Exam  Constitutional: She is oriented to person, place, and time. She appears well-developed and well-nourished. No distress.  Alert and in no acute distress  HENT:  Head: Normocephalic and atraumatic.  Neck:  Normal range of motion. Neck supple. No tracheal deviation present.  Cardiovascular: Normal rate, regular rhythm and normal heart sounds.  Exam reveals no gallop and no friction rub.   No murmur heard. Pulmonary/Chest: Effort normal and breath sounds normal. No respiratory distress. She has no wheezes. She has no rales. She exhibits no tenderness.  Abdominal: Soft. She exhibits no distension. There is no tenderness.  Musculoskeletal:  +  Tinel's right hand.  No Poole deformity noted. Patient has full active and passive range of motion without pain. There is no joint effusion noted. No erythema, or warmth overlaying the joint. There is tenderness to palpation over the palmar wrist surface. The patient has normal sensation and motor function in the median, ulnar, and radial nerve distributions.There is no anatomic snuff box tenderness.  2+ Radial pulse. Cap refill < 3 seconds.  Knees with full ROM, no erythema, no swelling, no ecchymosis.   Neurological: She is alert and oriented to person, place, and time.  Skin: Skin is warm and dry. No rash noted.  Psychiatric: She has a normal mood and affect. Her behavior is normal. Judgment and thought content normal.  Nursing note and vitals reviewed.   ED Course  Procedures (including critical care time)  DIAGNOSTIC STUDIES: Oxygen Saturation is 99% on RA, normal by my interpretation.    COORDINATION OF CARE: 11:07 AM - Discussed treatment plan with pt at bedside which includes ice, placement of wrist in a splint. Advised rest from work and wearing the splint. In addition, advised elevation and ice to alleviate pain and swelling in knees. Will give orthopedic referral for follow-up if patient's symptoms persist. Pt verbalized understanding and agreed to plan.   MDM   Final diagnoses:  Carpal tunnel syndrome of right wrist  Bilateral knee pain   Kelsey Poole presents with right wrist pain associated with hand numbness/tingling. 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 + Tinel's on exam. Will provide wrist splint and start on NSAID's for carpal tunnel. Pt. Also complaining of bilateral knee pain which she believes is 2/2 standing on feet all day. No erythema or swelling appreciated, full ROM. Symptomatic care encouraged. Ortho referral given if no improvement in symptoms.   I personally performed the services described in this documentation,  which was scribed in my presence. The recorded information has been reviewed and is accurate.  [facility] Deem Marmol, PA-C 10/25/15 1639  Lorre Nick, MD 10/27/15 [phone]

## 2016-08-16 ENCOUNTER — Emergency Department (HOSPITAL_COMMUNITY)

## 2016-08-16 ENCOUNTER — Emergency Department (HOSPITAL_COMMUNITY)
Admission: EM | Admit: 2016-08-16 | Discharge: 2016-08-16 | Disposition: A | Discharge status: Home / Self Care | Attending: Emergency Medicine | Admitting: Emergency Medicine

## 2016-08-16 ENCOUNTER — Encounter (HOSPITAL_COMMUNITY): Payer: Self-pay | Admitting: Nurse Practitioner

## 2016-08-16 DIAGNOSIS — Z79899 Other long term (current) drug therapy: Secondary | ICD-10-CM | POA: Insufficient documentation

## 2016-08-16 DIAGNOSIS — F17200 Nicotine dependence, unspecified, uncomplicated: Secondary | ICD-10-CM | POA: Insufficient documentation

## 2016-08-16 DIAGNOSIS — I10 Essential (primary) hypertension: Secondary | ICD-10-CM | POA: Insufficient documentation

## 2016-08-16 DIAGNOSIS — N390 Urinary tract infection, site not specified: Secondary | ICD-10-CM | POA: Insufficient documentation

## 2016-08-16 DIAGNOSIS — J45909 Unspecified asthma, uncomplicated: Secondary | ICD-10-CM | POA: Insufficient documentation

## 2016-08-16 DIAGNOSIS — R1030 Lower abdominal pain, unspecified: Secondary | ICD-10-CM | POA: Diagnosis present

## 2016-08-16 HISTORY — DX: Unspecified asthma, uncomplicated: J45.909

## 2016-08-16 LAB — COMPREHENSIVE METABOLIC PANEL
ALBUMIN: 4.4 g/dL (ref 3.5–5.0)
ALK PHOS: 103 U/L (ref 38–126)
ALT: 26 U/L (ref 14–54)
AST: 44 U/L — AB (ref 15–41)
Anion gap: 11 (ref 5–15)
BILIRUBIN TOTAL: 0.5 mg/dL (ref 0.3–1.2)
BUN: 12 mg/dL (ref 6–20)
CALCIUM: 9.9 mg/dL (ref 8.9–10.3)
CO2: 24 mmol/L (ref 22–32)
Chloride: 101 mmol/L (ref 101–111)
Creatinine, Ser: 1.46 mg/dL — ABNORMAL HIGH (ref 0.44–1.00)
GFR calc Af Amer: 46 mL/min — ABNORMAL LOW (ref >60)
GFR calc non Af Amer: 39 mL/min — ABNORMAL LOW (ref >60)
GLUCOSE: 151 mg/dL — AB (ref 65–99)
Potassium: 3.5 mmol/L (ref 3.5–5.1)
SODIUM: 136 mmol/L (ref 135–145)
Total Protein: 7 g/dL (ref 6.5–8.1)

## 2016-08-16 LAB — URINALYSIS, MICROSCOPIC (REFLEX): RBC / HPF: NONE SEEN RBC/hpf (ref 0–5)

## 2016-08-16 LAB — LIPASE, BLOOD: Lipase: 59 U/L — ABNORMAL HIGH (ref 11–51)

## 2016-08-16 LAB — CBC
HEMATOCRIT: 36.1 % (ref 36.0–46.0)
Hemoglobin: 12.4 g/dL (ref 12.0–15.0)
MCH: 33.4 pg (ref 26.0–34.0)
MCHC: 34.3 g/dL (ref 30.0–36.0)
MCV: 97.3 fL (ref 78.0–100.0)
Platelets: 271 10*3/uL (ref 150–400)
RBC: 3.71 MIL/uL — ABNORMAL LOW (ref 3.87–5.11)
RDW: 13.8 % (ref 11.5–15.5)
WBC: 6.6 10*3/uL (ref 4.0–10.5)

## 2016-08-16 LAB — URINALYSIS, ROUTINE W REFLEX MICROSCOPIC
BILIRUBIN URINE: NEGATIVE
Glucose, UA: NEGATIVE mg/dL
HGB URINE DIPSTICK: NEGATIVE
Ketones, ur: NEGATIVE mg/dL
Nitrite: POSITIVE — AB
PH: 6 (ref 5.0–8.0)
Protein, ur: NEGATIVE mg/dL

## 2016-08-16 MED ORDER — LISINOPRIL 10 MG PO TABS: 10.0000 mg | ORAL_TABLET | Freq: Every day | ORAL | 0 refills | Status: DC

## 2016-08-16 MED ORDER — PANTOPRAZOLE SODIUM 20 MG PO TBEC: 40.0000 mg | DELAYED_RELEASE_TABLET | Freq: Every day | ORAL | 0 refills | Status: DC

## 2016-08-16 MED ORDER — CEPHALEXIN 500 MG PO CAPS
ORAL_CAPSULE | ORAL | 0 refills | Status: DC
Start: 2016-08-16 — End: 2019-04-30

## 2016-08-16 MED ORDER — ONDANSETRON HCL 4 MG/2ML IJ SOLN
4.0000 mg | Freq: Once | INTRAMUSCULAR | Status: AC
  Administered 2016-08-16: 4 mg via INTRAVENOUS
  Filled 2016-08-16: qty 2

## 2016-08-16 MED ORDER — SODIUM CHLORIDE 0.9 % IV BOLUS (SEPSIS)
1000.0000 mL | Freq: Once | INTRAVENOUS | Status: AC
  Administered 2016-08-16: 1000 mL via INTRAVENOUS

## 2016-08-16 MED ORDER — HYDROMORPHONE HCL 2 MG/ML IJ SOLN
1.0000 mg | Freq: Once | INTRAMUSCULAR | Status: AC
  Administered 2016-08-16: 1 mg via INTRAVENOUS
  Filled 2016-08-16: qty 1

## 2016-08-16 MED ORDER — IOPAMIDOL (ISOVUE-300) INJECTION 61%
INTRAVENOUS | Status: AC
  Administered 2016-08-16: 80 mL
  Filled 2016-08-16: qty 100

## 2016-08-16 MED ORDER — CEFTRIAXONE SODIUM 1 G IJ SOLR
1.0000 g | Freq: Once | INTRAMUSCULAR | Status: AC
  Administered 2016-08-16: 1 g via INTRAVENOUS
  Filled 2016-08-16: qty 10

## 2016-08-16 NOTE — ED Triage Notes (Signed)
Pt presents with c/o abd pain. The pain began 3 days and has been progressively worse since onset. She describes that pain as a gnawing, shooting, spasm generalized across her entire abdomen radiating into her back. She reports nausea, vomiting, urinary frequency. She denies fevers, chills, constipation, diarrhea.

## 2016-08-16 NOTE — ED Provider Notes (Signed)
[facility]-EMERGENCY DEPT Provider Note   CSN: [id number] Arrival date & time: 08/16/16  1055     History   Chief Complaint Chief Complaint  Patient presents with  . Abdominal Pain    HPI Kelsey Poole is a 55 y.o. female.   Abdominal Pain   This is a new problem. The current episode started more than 2 days ago. The problem occurs constantly. The problem has been gradually worsening. The pain is associated with alcohol use. The pain is located in the epigastric region and suprapubic region. The quality of the pain is sharp and pressure-like. The pain is moderate. Associated symptoms include frequency. Pertinent negatives include fever, belching, diarrhea, flatus, nausea, vomiting and dysuria. Nothing aggravates the symptoms. Nothing relieves the symptoms.    Past Medical History:  Diagnosis Date  . Asthma   . Hypertension     There are no active problems to display for this patient.   History reviewed. No pertinent surgical history.  OB History    No data available       Home Medications    Prior to Admission medications   Medication Sig Start Date End Date Taking? Authorizing Provider  Albuterol Sulfate 108 (90 BASE) MCG/ACT AEPB Inhale 1 spray into the lungs daily as needed (asthma).   Yes Historical Provider, MD  naproxen sodium (ANAPROX) 220 MG tablet Take 220 mg by mouth daily as needed (headache).   Yes Historical Provider, MD  BAYER ASPIRIN PO Take 2 tablets by mouth daily as needed (headache).    Historical Provider, MD  butalbital-acetaminophen-caffeine (FIORICET) 50-325-40 MG per tablet Take 1 tablet by mouth every 6 (six) hours as needed for headache. Patient not taking: Reported on 08/16/2016 10/12/14   Trixie DredgeEmily West, PA-C  cephALEXin [facility](KEFLEX) 500 MG capsule 2 caps po bid x 7 days 08/16/16   Marily MemosJason Damel Querry, MD  lisinopril (PRINIVIL,ZESTRIL) 10 MG tablet Take 1 tablet (10 mg total) by mouth daily. 08/16/16   Marily MemosJason Sven Pinheiro, MD  naproxen (NAPROSYN) 500 MG tablet Take  1 tablet (500 mg total) by mouth 2 (two) times daily. Patient not taking: Reported on 08/16/2016 10/25/15   [facility] Surgical CenterJaime Pilcher Ward, PA-C  pantoprazole (PROTONIX) 20 MG tablet Take 2 tablets (40 mg total) by mouth daily. 08/16/16   Marily MemosJason Cearra Portnoy, MD    Family History History reviewed. No pertinent family history.  Social History Social History  Substance Use Topics  . Smoking status: Current Every Day Smoker  . Smokeless tobacco: Never Used  . Alcohol use Yes     Allergies   Percocet [oxycodone-acetaminophen] and Azithromycin   Review of Systems Review of Systems  Constitutional: Negative for fever.  Eyes: Negative for pain.  Respiratory: Negative for cough and shortness of breath.   Cardiovascular: Negative for chest pain.  Gastrointestinal: Positive for abdominal pain. Negative for diarrhea, flatus, nausea and vomiting.  Endocrine: Negative for polydipsia and polyuria.  Genitourinary: Positive for frequency. Negative for dysuria.  All other systems reviewed and are negative.    Physical Exam Updated Vital Signs BP 166/98 (BP Location: Right Arm)   Pulse 77   Temp 98.1 F (36.7 C) (Oral)   Resp 18   SpO2 97%   Physical Exam  Constitutional: She is oriented to person, place, and time. She appears well-developed and well-nourished.  HENT:  Head: Normocephalic and atraumatic.  Eyes: Conjunctivae and EOM are normal.  Neck: Normal range of motion.  Cardiovascular: Normal rate and regular rhythm.  Exam reveals no friction rub.  No murmur heard. Pulmonary/Chest: Effort normal and breath sounds normal. No stridor. No respiratory distress. She has no wheezes.  Abdominal: Soft. She exhibits no distension. There is tenderness. There is no rebound and no guarding.  Musculoskeletal: Normal range of motion. She exhibits no edema or deformity.  Neurological: She is alert and oriented to person, place, and time. No cranial nerve deficit. Coordination normal.  Skin: Skin is warm and  dry. No erythema. No pallor.  Nursing note and vitals reviewed.    ED Treatments / Results  Labs (all labs ordered are listed, but only abnormal results are displayed) Labs Reviewed  LIPASE, BLOOD - Abnormal; Notable for the following:       Result Value   Lipase 59 (*)    All other components within normal limits  COMPREHENSIVE METABOLIC PANEL - Abnormal; Notable for the following:    Glucose, Bld 151 (*)    Creatinine, Ser 1.46 (*)    AST 44 (*)    GFR calc non Af Amer 39 (*)    GFR calc Af Amer 46 (*)    All other components within normal limits  CBC - Abnormal; Notable for the following:    RBC 3.71 (*)    All other components within normal limits  URINALYSIS, ROUTINE W REFLEX MICROSCOPIC - Abnormal; Notable for the following:    Specific Gravity, Urine <1.005 (*)    Nitrite POSITIVE (*)    Leukocytes, UA TRACE (*)    All other components within normal limits  URINALYSIS, MICROSCOPIC (REFLEX) - Abnormal; Notable for the following:    Bacteria, UA MANY (*)    Squamous Epithelial / LPF 6-30 (*)    All other components within normal limits    EKG  EKG Interpretation None       Radiology Ct Abdomen Pelvis W Contrast  Result Date: 08/16/2016 CLINICAL DATA:  Epigastric pain, right lower quadrant pain EXAM: CT ABDOMEN AND PELVIS WITH CONTRAST TECHNIQUE: Multidetector CT imaging of the abdomen and pelvis was performed using the standard protocol following bolus administration of intravenous contrast. CONTRAST:  80mL ISOVUE-300 IOPAMIDOL (ISOVUE-300) INJECTION 61% COMPARISON:  None. FINDINGS: Lower chest: No acute abnormality. Hepatobiliary: No focal liver abnormality is seen. No gallstones, gallbladder wall thickening, or biliary dilatation. Pancreas: Unremarkable. No pancreatic ductal dilatation or surrounding inflammatory changes. Spleen: Normal in size without focal abnormality. Adrenals/Urinary Tract: Adrenal glands are unremarkable. Kidneys are normal, without renal  calculi, focal lesion, or hydronephrosis. Bladder is unremarkable. Stomach/Bowel: Stomach is within normal limits. Appendix appears normal. No evidence of bowel wall thickening, distention, or inflammatory changes. Vascular/Lymphatic: Normal caliber abdominal aorta with mild atherosclerosis. No enlarged abdominal or pelvic lymph nodes. Reproductive: Uterus and bilateral adnexa are unremarkable. Other: No abdominal wall hernia or abnormality. No abdominopelvic ascites. Musculoskeletal: No acute or significant osseous findings. The mild degenerative disc disease at L5-S1 with a mild broad-based disc bulge and bilateral facet arthropathy. Bilateral severe facet arthropathy at L4-5. Subchondral serpiginous sclerosis in and superior bilateral femoral heads most consistent with avascular necrosis without articular surface collapse. IMPRESSION: 1. No acute abdominal or pelvic pathology. 2. Normal appendix. 3.  Aortic Atherosclerosis (ICD10-170.0) 4. Avascular necrosis of bilateral femoral heads. Electronically Signed   By: Elige Ko   On: 08/16/2016 14:19    Procedures Procedures (including critical care time)  Medications Ordered in ED Medications  HYDROmorphone (DILAUDID) injection 1 mg (1 mg Intravenous Given 08/16/16 1307)  sodium chloride 0.9 % bolus 1,000 mL (0 mLs Intravenous Stopped 08/16/16  1405)  ondansetron (ZOFRAN) injection 4 mg (4 mg Intravenous Given 08/16/16 1307)  iopamidol (ISOVUE-300) 61 % injection (80 mLs  Contrast Given 08/16/16 1352)  cefTRIAXone (ROCEPHIN) 1 g in dextrose 5 % 50 mL IVPB (0 g Intravenous Stopped 08/16/16 1452)     Initial Impression / Assessment and Plan / ED Course  I have reviewed the triage vital signs and the nursing notes.  Pertinent labs & imaging results that were available during my care of the patient were reviewed by me and considered in my medical decision making (see chart for details).  Clinical Course    Patient with a few days of abdominal  pain, epigastric and suprapbuic in nature. Exam as above with minimal epigastric ttp, no peritonitis.  Concern for pancreatitis v AAA v UTI colitis.  Plan for labs/ct/urine/pain control/antiemetics.  Possibly mild pancreatitis, but does have UTI. Tolerating PO. Improved pain here. Will dc on abx and short course of pain meds. PCP follow up advised.  Final Clinical Impressions(s) / ED Diagnoses   Final diagnoses:  Lower urinary tract infectious disease    New Prescriptions Discharge Medication List as of 08/16/2016  4:21 PM    START taking these medications   Details  cephALEXin (KEFLEX) 500 MG capsule 2 caps po bid x 7 days, Print         Marily MemosJason Jirah Rider, MD 08/17/16 1406

## 2016-08-16 NOTE — ED Notes (Signed)
Patient transported to CT 

## 2016-08-16 NOTE — ED Notes (Signed)
Patient returned from CT

## 2017-04-19 ENCOUNTER — Encounter (HOSPITAL_COMMUNITY): Payer: Self-pay | Admitting: Emergency Medicine

## 2017-04-19 ENCOUNTER — Emergency Department (HOSPITAL_COMMUNITY)
Admission: EM | Admit: 2017-04-19 | Discharge: 2017-04-19 | Disposition: A | Discharge status: Home / Self Care | Attending: Emergency Medicine | Admitting: Emergency Medicine

## 2017-04-19 DIAGNOSIS — M79641 Pain in right hand: Secondary | ICD-10-CM | POA: Diagnosis not present

## 2017-04-19 DIAGNOSIS — I10 Essential (primary) hypertension: Secondary | ICD-10-CM | POA: Insufficient documentation

## 2017-04-19 DIAGNOSIS — J45909 Unspecified asthma, uncomplicated: Secondary | ICD-10-CM | POA: Insufficient documentation

## 2017-04-19 DIAGNOSIS — F17200 Nicotine dependence, unspecified, uncomplicated: Secondary | ICD-10-CM | POA: Diagnosis not present

## 2017-04-19 HISTORY — DX: Carpal tunnel syndrome, unspecified upper limb: G56.00

## 2017-04-19 NOTE — Discharge Instructions (Signed)
Please continue to wear brace Follow up with Orthopedics

## 2017-04-19 NOTE — ED Provider Notes (Signed)
[facility]-EMERGENCY DEPT Provider Note   CSN: [id number] Arrival date & time: 04/19/17  1801     History   Chief Complaint Chief Complaint  Patient presents with  . Wrist Pain    HPI Naiya Corral is a 56 y.o. female who presents with right wrist pain. She states she has had this pain on and off for years. She has never been formally diagnosed with carpal tunnel however believes that her symptoms are consistent with this. She also has pain in her left hand intermittently but the pain is worse in her right because she uses it more. She reports associated numbness and tingling. 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 She does not have a doctor in the area stating that she is from [place] but she lives here now. States that rubbing her wrist and her arm makes her symptoms better. She has been wearing a brace with mild relief.  HPI  Past Medical History:  Diagnosis Date  . Asthma   . Carpal tunnel syndrome   . Hypertension     There are no active problems to display for this patient.   History reviewed. No pertinent surgical history.  OB History    No data available       Home Medications    Prior to Admission medications   Medication Sig Start Date End Date Taking? Authorizing Provider  Albuterol Sulfate 108 (90 BASE) MCG/ACT AEPB Inhale 1 spray into the lungs daily as needed (asthma).    [provider]  BAYER ASPIRIN PO Take 2 tablets by mouth daily as needed (headache).    [provider]  cephALEXin (KEFLEX) 500 MG capsule 2 caps po bid x 7 days 08/16/16   Mesner, Barbara Cower, MD  lisinopril (PRINIVIL,ZESTRIL) 10 MG tablet Take 1 tablet (10 mg total) by mouth daily. 08/16/16   Mesner, Barbara Cower, MD  naproxen sodium (ANAPROX) 220 MG tablet Take 220 mg by mouth daily as needed (headache).    [provider]  pantoprazole (PROTONIX) 20 MG tablet Take 2  tablets (40 mg total) by mouth daily. 08/16/16   Mesner, Barbara Cower, MD    Family History History reviewed. No pertinent family history.  Social History Social History  Substance Use Topics  . Smoking status: Current Every Day Smoker  . Smokeless tobacco: Never Used  . Alcohol use Yes     Allergies   Percocet [oxycodone-acetaminophen] and Azithromycin   Review of Systems Review of Systems  Musculoskeletal: Positive for arthralgias and myalgias. Negative for joint swelling.  Skin: Negative for wound.  Neurological: Positive for numbness. Negative for weakness.     Physical Exam Updated Vital Signs BP 95/69 (BP Location: Left Arm)   Pulse 96   Temp 97.8 F (36.6 C) (Oral)   Resp 16   SpO2 98%   Physical Exam  Constitutional: She is oriented to person, place, and time. She appears well-developed and well-nourished. No distress.  HENT:  Head: Normocephalic and atraumatic.  Eyes: Pupils are equal, round, and reactive to light. Conjunctivae are normal. Right eye exhibits no discharge. Left eye exhibits no discharge. No scleral icterus.  Neck: Normal range of motion.  Cardiovascular: Normal rate.   Pulmonary/Chest: Effort normal. No respiratory distress.  Abdominal: She exhibits no distension.  Musculoskeletal:  Right hand: No obvious swelling, deformity, or warmth. No tenderness to palpation. FROM of fingers, wrist, elbow. 5/5 strength. Subjective tingling  of her index, middle, ring, and pinky finger. 2+ radial pulse   Neurological: She is alert and oriented to person, place, and time.  Skin: Skin is warm and dry.  Psychiatric: She has a normal mood and affect. Her behavior is normal.  Nursing note and vitals reviewed.    ED Treatments / Results  Labs (all labs ordered are listed, but only abnormal results are displayed) Labs Reviewed - No data to display  EKG  EKG Interpretation None       Radiology No results found.  Procedures Procedures (including  critical care time)  Medications Ordered in ED Medications - No data to display   Initial Impression / Assessment and Plan / ED Course  I have reviewed the triage vital signs and the nursing notes.  Pertinent labs & imaging results that were available during my care of the patient were reviewed by me and considered in my medical decision making (see chart for details).  56 year old female presents with acute on chronic right hand and wrist pain likely due to overuse at her job. 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 I explained to her that I am not able to give her restrictions or manage her pain long term so she will need to follow up with orthopedics. I did give her a work note for 2 days. I also doubt that all her symptoms are due to carpal tunnel. She has symptoms in all her fingers except her thumb. I advised continue conservative measures (bracing, NSAIDs, rest) and to follow up with orthopedics and establish care with PCP if she plans to stay in the area.  Final Clinical Impressions(s) / ED Diagnoses   Final diagnoses:  Right hand pain    New Prescriptions Current Discharge Medication List       Beryle QuantGekas, Jeren Dufrane Marie, PA-C 04/19/17 1925    Lorre NickAllen, Anthony, MD 04/19/17 2213

## 2017-04-19 NOTE — ED Triage Notes (Signed)
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

## 2018-01-23 IMAGING — CT CT ABD-PELV W/ CM
2 of 5 series · 16 of 46 positions shown, 18 images · IV contrast (Omni 300)
Comparison: None.

CLINICAL DATA: Epigastric pain, right lower quadrant pain

EXAM:
CT ABDOMEN AND PELVIS WITH CONTRAST
TECHNIQUE: Multidetector CT imaging of the abdomen and pelvis was performed
using the standard protocol following bolus administration of
intravenous contrast.
CONTRAST:  80mL [id number] IOPAMIDOL ([id number]) INJECTION 61%

[Series 2: a/p w/ 5mm · axial · 0.71mm/px · z∈[+862,+1277]mm · 13 of 93 slices shown, 15 images]
[im 5/93  soft-tissue]
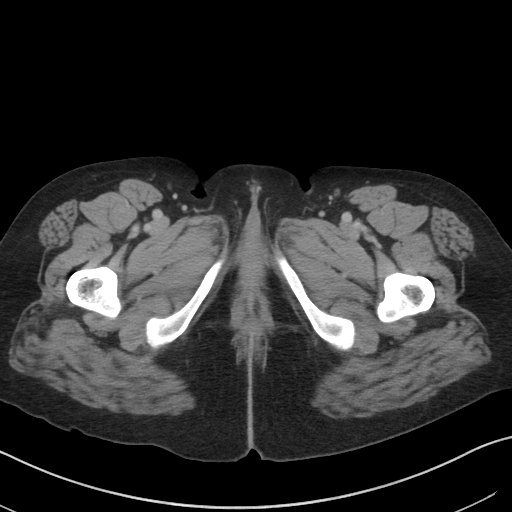
[im 5/93  bone]
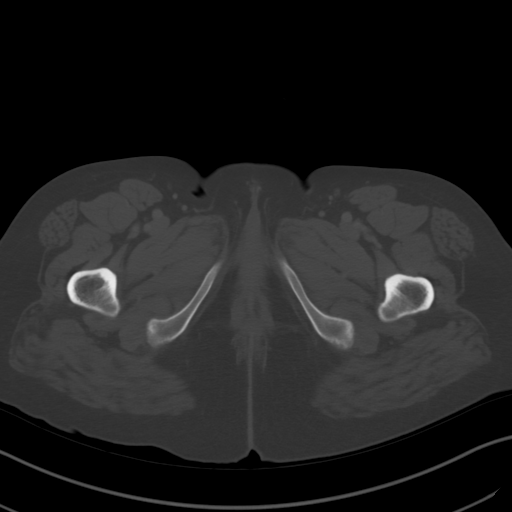
[im 14/93  soft-tissue]
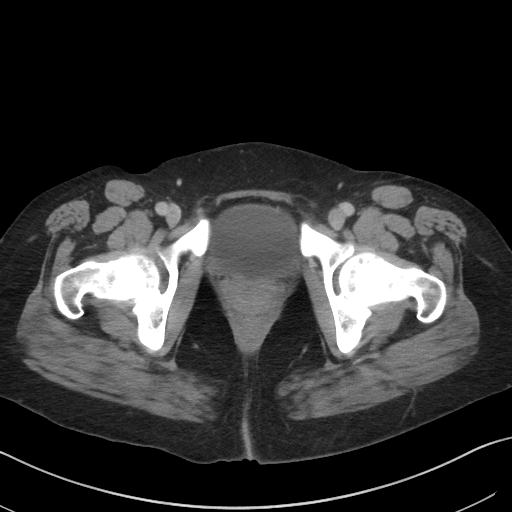
[im 19/93  soft-tissue]
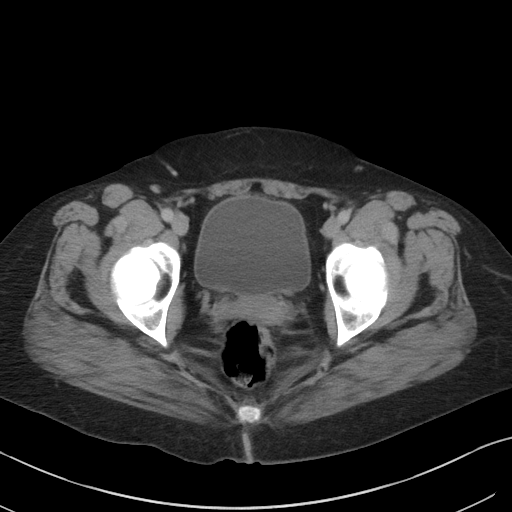
[im 28/93  soft-tissue]
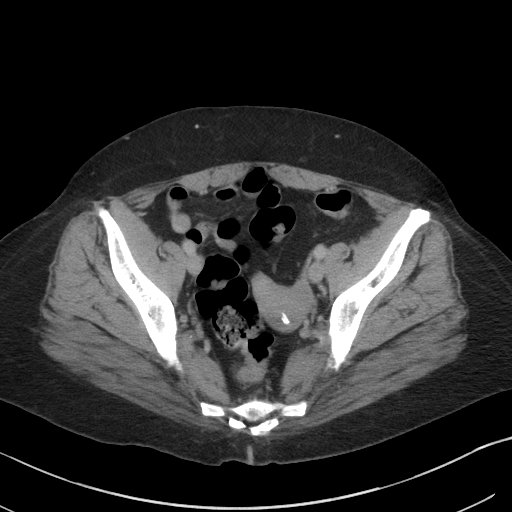
[im 33/93  soft-tissue]
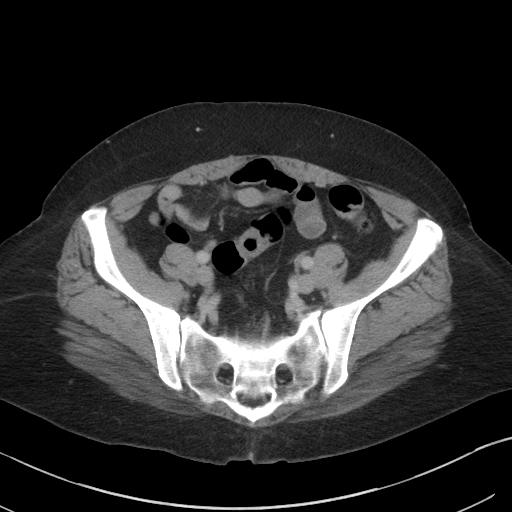
[im 42/93  soft-tissue]
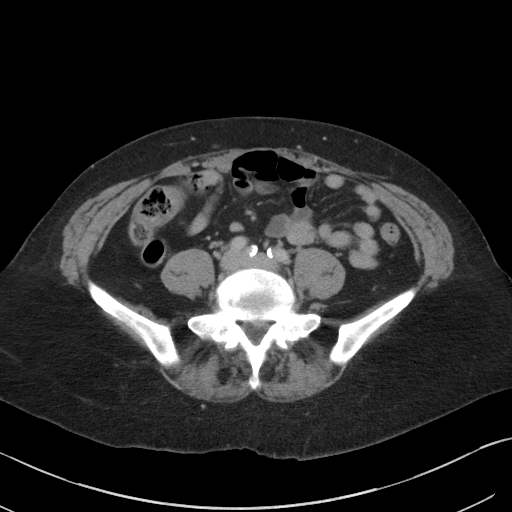
[im 47/93  soft-tissue]
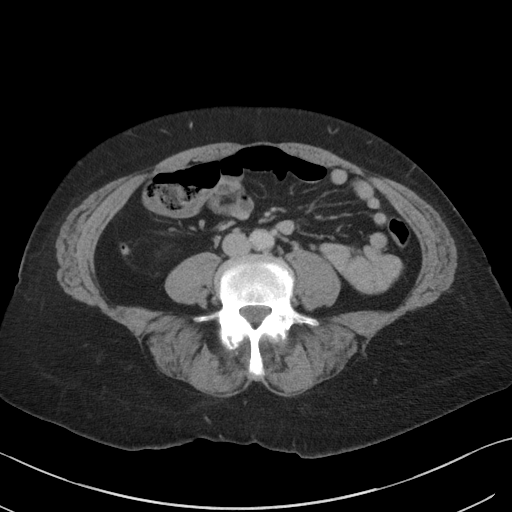
[im 51/93  soft-tissue]
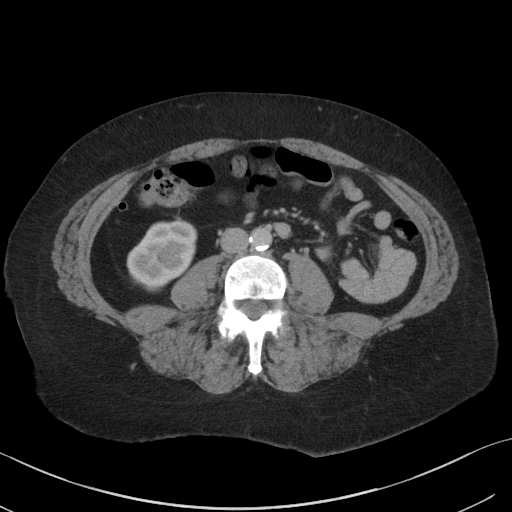
[im 60/93  soft-tissue]
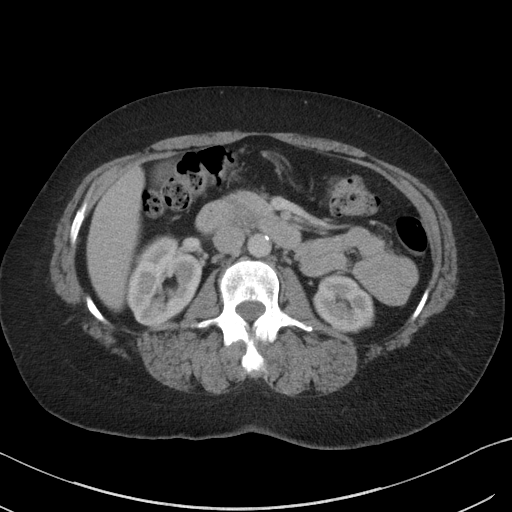
[im 60/93  bone]
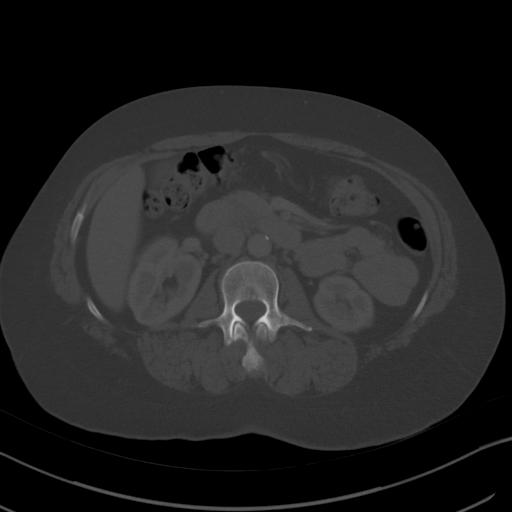
[im 65/93  soft-tissue]
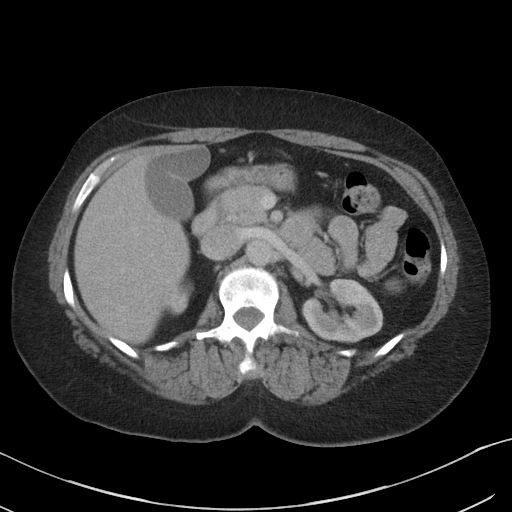
[im 74/93  soft-tissue]
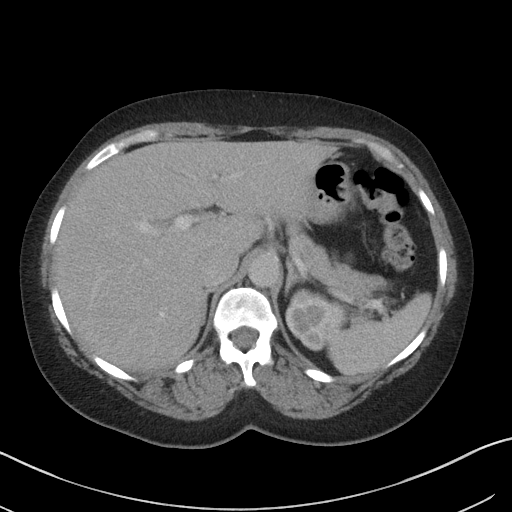
[im 79/93  soft-tissue]
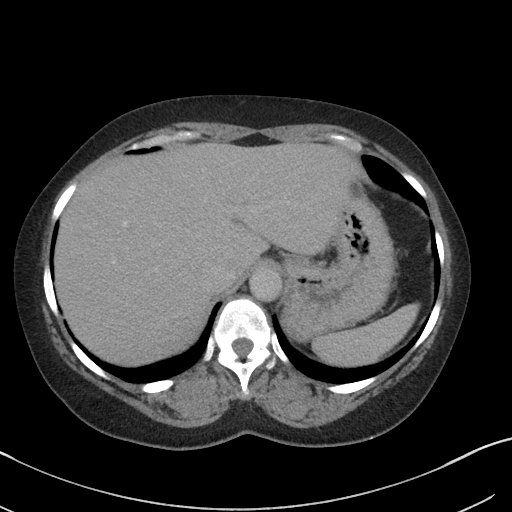
[im 88/93  soft-tissue]
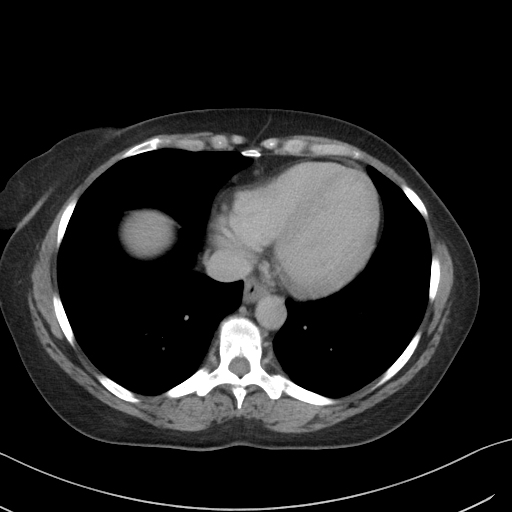

[Series 5: a/p w/ cor · coronal · 0.79mm/px · 3 of 124 slices shown]
[im 42/124  soft-tissue]
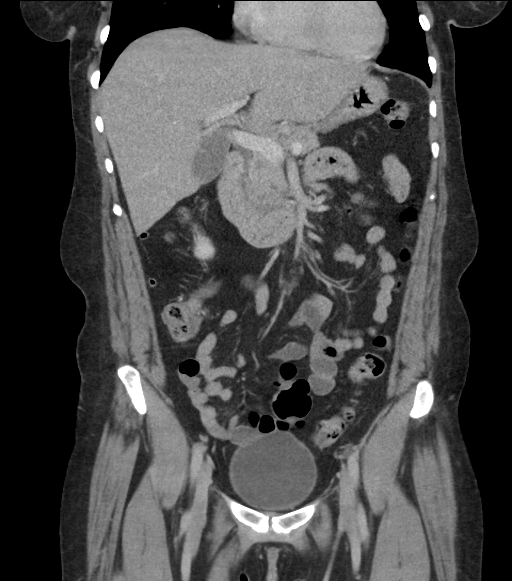
[im 55/124  soft-tissue]
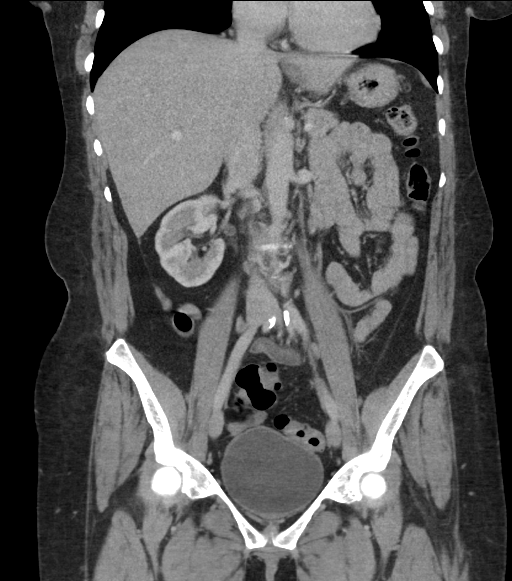
[im 69/124  soft-tissue]
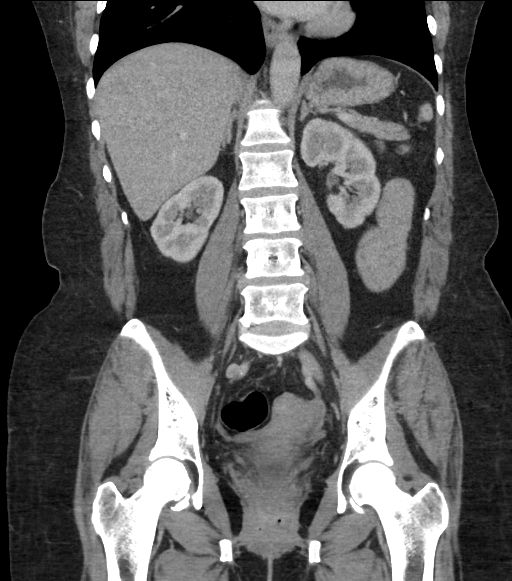

[16 of 46 positions shown; findings below may reference images not displayed]

FINDINGS: Lower chest: No acute abnormality.

Hepatobiliary: No focal liver abnormality is seen. No gallstones,
gallbladder wall thickening, or biliary dilatation.

Pancreas: Unremarkable. No pancreatic ductal dilatation or
surrounding inflammatory changes.

Spleen: Normal in size without focal abnormality.

Adrenals/Urinary Tract: Adrenal glands are unremarkable. Kidneys are
normal, without renal calculi, focal lesion, or hydronephrosis.
Bladder is unremarkable.

Stomach/Bowel: Stomach is within normal limits. Appendix appears
normal. No evidence of bowel wall thickening, distention, or
inflammatory changes.

Vascular/Lymphatic: Normal caliber abdominal aorta with mild
atherosclerosis. No enlarged abdominal or pelvic lymph nodes.

Reproductive: Uterus and bilateral adnexa are unremarkable.

Other: No abdominal wall hernia or abnormality. No abdominopelvic
ascites.

Musculoskeletal: No acute or significant osseous findings. The mild
degenerative disc disease at L5-S1 with a mild broad-based disc
bulge and bilateral facet arthropathy. Bilateral severe facet
arthropathy at L4-5. Subchondral serpiginous sclerosis in and
superior bilateral femoral heads most consistent with avascular
necrosis without articular surface collapse.
IMPRESSION: 1. No acute abdominal or pelvic pathology.
2. Normal appendix.
3.  Aortic Atherosclerosis ([id number]-170.0)
4. Avascular necrosis of bilateral femoral heads.

## 2018-03-25 ENCOUNTER — Telehealth (HOSPITAL_COMMUNITY): Payer: Self-pay

## 2018-03-25 NOTE — Telephone Encounter (Signed)
Returned phone call to patient. She wanted to schedule an appointment for [facility] but did not want to give income information or to provide food stamp letter. Patient then hung up. Patient not eligible for [facility] because of this.

## 2018-07-24 ENCOUNTER — Other Ambulatory Visit (HOSPITAL_COMMUNITY): Payer: Self-pay | Admitting: *Deleted

## 2018-07-24 DIAGNOSIS — Z1231 Encounter for screening mammogram for malignant neoplasm of breast: Secondary | ICD-10-CM

## 2018-08-15 ENCOUNTER — Inpatient Hospital Stay: Admission: RE | Admit: 2018-08-15 | Source: Ambulatory Visit

## 2018-08-15 ENCOUNTER — Ambulatory Visit (HOSPITAL_COMMUNITY)

## 2018-10-17 ENCOUNTER — Ambulatory Visit (HOSPITAL_COMMUNITY)
Admission: RE | Admit: 2018-10-17 | Discharge: 2018-10-17 | Disposition: A | Discharge status: Home / Self Care | Source: Ambulatory Visit | Attending: Obstetrics and Gynecology | Admitting: Obstetrics and Gynecology

## 2018-10-17 ENCOUNTER — Other Ambulatory Visit: Payer: Self-pay

## 2018-10-17 ENCOUNTER — Encounter (HOSPITAL_COMMUNITY): Payer: Self-pay

## 2018-10-17 VITALS — BP 108/72 | Wt 158.0 lb

## 2018-10-17 DIAGNOSIS — Z01419 Encounter for gynecological examination (general) (routine) without abnormal findings: Secondary | ICD-10-CM

## 2018-10-17 NOTE — Patient Instructions (Signed)
Explained breast self awareness with [name]. Let patient know [facility] will cover Pap smears and HPV typing every 5 years unless has a history of abnormal Pap smears. Referred patient to the [facility] of [facility] for a screening mammogram. Appointment scheduled for Tuesday, October 22, 2018 at 0910. Patient aware of appointment and will be there. Let patient know will follow up with her within the next couple weeks with results of Pap smear by letter or phone. Informed patient that the Breast Center will follow-up with her within a couple of weeks following mammogram appointment by letter or phone. Discussed smoking cessation with patient. Referred to the [facility] Quitline and gave resources to the free smoking cessation classes at [facility]. Patient scheduled to come to the free lung cancer screening on Monday, December 02, 2018 at 1815. Kelsey Poole verbalized understanding.  [name], [name], RN 3:28 PM

## 2018-10-17 NOTE — Progress Notes (Signed)
No complaints today.   Pap Smear: Pap smear completed today. Last Pap smear was around 12 years ago  and normal per patient. Per patient has a history of an abnormal Pap smear around 38 years ago that a colposcopy was completed for follow-up. Patient stated she has had at least three normal Pap smears since colposcopy. No Pap smear results are in Epic.  Physical exam: Breasts Breasts symmetrical. No skin abnormalities bilateral breasts. No nipple retraction bilateral breasts. No nipple discharge bilateral breasts. No lymphadenopathy. No lumps palpated bilateral breasts. No complaints of pain or tenderness on exam. Referred patient to the [facility] of [facility] for a screening mammogram. Appointment scheduled for Tuesday, October 22, 2018 at 0910.        Pelvic/Bimanual   Ext Genitalia No lesions, no swelling and no discharge observed on external genitalia.         Vagina Vagina pink and normal texture. No lesions or discharge observed in vagina.          Cervix Cervix is present. Cervix pink and of normal texture. No discharge observed.     Uterus Uterus is present and palpable. Uterus in normal position and normal size.        Adnexae Bilateral ovaries present and palpable. No tenderness on palpation.         Rectovaginal No rectal exam completed today since patient had no rectal complaints. No skin abnormalities observed on exam.    Smoking History: Patient is a current smoker. Discussed smoking cessation with patient. Referred to the [facility] Quitline and gave resources to the free smoking cessation classes at [facility].  Patient Navigation: Patient education provided. Access to services provided for patient through [facility] program.   Colorectal Cancer Screening: Per patient has never had a colonoscopy completed. No complaints today. FIT Test given to patient to complete and return to [facility].  Breast and Cervical Cancer Risk Assessment: Patient has no family history of breast  cancer, known genetic mutations, or radiation treatment to the chest before age 55. Per patient has a history of cervical dysplasia. Patient has no history of being immunocompromised or DES exposure in-utero.  Risk Assessment    Risk Scores      10/17/2018   Last edited by: Lynnell Dike, LPN   5-year risk: 1.1 %   Lifetime risk: 5.8 %

## 2018-10-18 ENCOUNTER — Encounter (HOSPITAL_COMMUNITY): Payer: Self-pay | Admitting: *Deleted

## 2018-10-22 ENCOUNTER — Ambulatory Visit
Admission: RE | Admit: 2018-10-22 | Discharge: 2018-10-22 | Disposition: A | Discharge status: Home / Self Care | Payer: Self-pay | Source: Ambulatory Visit | Attending: Obstetrics and Gynecology | Admitting: Obstetrics and Gynecology

## 2018-10-22 DIAGNOSIS — Z1231 Encounter for screening mammogram for malignant neoplasm of breast: Secondary | ICD-10-CM

## 2018-10-30 LAB — FECAL OCCULT BLOOD, IMMUNOCHEMICAL: FECAL OCCULT BLD: NEGATIVE

## 2018-11-25 ENCOUNTER — Encounter (HOSPITAL_COMMUNITY): Payer: Self-pay

## 2018-11-25 ENCOUNTER — Telehealth (HOSPITAL_COMMUNITY): Payer: Self-pay

## 2018-11-25 NOTE — Telephone Encounter (Signed)
Spoke with patient about how the lung screening is cancelled for 12/02/18 due to the corona virus. Patient also asked if we had the results back from the FIT Test that was completed in [facility]. Informed patient that the FIT test came back negative and that she will also be getting a letter in the mail informing her of the results. Patient voiced understanding.

## 2018-12-02 ENCOUNTER — Ambulatory Visit

## 2019-03-14 ENCOUNTER — Telehealth: Payer: Self-pay

## 2019-03-14 NOTE — Telephone Encounter (Signed)
Left message with patient about lung screening. Left name and number for patient to call back to confirm appt.

## 2019-03-17 ENCOUNTER — Ambulatory Visit

## 2019-04-30 ENCOUNTER — Other Ambulatory Visit: Payer: Self-pay

## 2019-04-30 ENCOUNTER — Encounter (HOSPITAL_COMMUNITY): Payer: Self-pay

## 2019-04-30 ENCOUNTER — Ambulatory Visit (HOSPITAL_COMMUNITY)
Admission: EM | Admit: 2019-04-30 | Discharge: 2019-04-30 | Disposition: A | Discharge status: Home / Self Care | Attending: Family Medicine | Admitting: Family Medicine

## 2019-04-30 DIAGNOSIS — B86 Scabies: Secondary | ICD-10-CM | POA: Diagnosis not present

## 2019-04-30 MED ORDER — PERMETHRIN 5 % EX CREA: TOPICAL_CREAM | CUTANEOUS | 2 refills | Status: DC

## 2019-04-30 MED ORDER — HYDROXYZINE HCL 10 MG PO TABS: 10.0000 mg | ORAL_TABLET | Freq: Three times a day (TID) | ORAL | 0 refills | Status: DC | PRN

## 2019-04-30 NOTE — ED Provider Notes (Signed)
[place]    CSN: [id number] Arrival date & time: 04/30/19  9371      History   Chief Complaint Chief Complaint  Patient presents with  . Allergic Reaction    HPI Kelsey Poole is a 58 y.o. female.   Patient is a 58 year old female that presents today with rash that is worsening.  Reports this started approximately 2 months ago.  Rash started on the right wrist and has since spread to bilateral hands fingers lower extremities and buttocks area.  The rash is very pruritic mostly at nighttime.  Kelsey Poole has been using Benadryl, calamine lotion without much relief.  Kelsey Poole has small open wounds on the hands, legs and bottom.  Reporting that Kelsey Poole sleeps on a couch every evening that is very old.  Reporting that her dog sleeps with her.  Her husband has not had the rash but he sleeps in the bedroom.   Denies any fever, joint pain. Denies any recent changes in lotions, detergents, foods or other possible irritants. No recent travel. Nobody else at home has the rash. Patient has been outside but denies any contact with plants or insects. No new foods or medications.   ROS per HPI        Past Medical History:  Diagnosis Date  . Asthma   . Carpal tunnel syndrome   . Hypertension     There are no active problems to display for this patient.   Past Surgical History:  Procedure Laterality Date  . CESAREAN SECTION      OB History    Gravida  3   Para      Term      Preterm      AB  1   Living  2     SAB      TAB  1   Ectopic      Multiple      Live Births  2            Home Medications    Prior to Admission medications   Medication Sig Start Date End Date Taking? Authorizing Provider  Albuterol Sulfate 108 (90 BASE) MCG/ACT AEPB Inhale 1 spray into the lungs daily as needed (asthma).    [provider]  hydrOXYzine (ATARAX/VISTARIL) 10 MG tablet Take 1 tablet (10 mg total) by mouth 3 (three) times daily as needed. 04/30/19   Loura Halt A, NP  lisinopril (PRINIVIL,ZESTRIL) 10 MG tablet Take 1 tablet (10 mg total) by mouth daily. Patient not taking: Reported on 10/17/2018 08/16/16   Mesner, Corene Cornea, MD  permethrin (ELIMITE) 5 % cream Apply to affected area once 04/30/19   Loura Halt A, NP  pantoprazole (PROTONIX) 20 MG tablet Take 2 tablets (40 mg total) by mouth daily. Patient not taking: Reported on 10/17/2018 08/16/16 04/30/19  Mesner, Corene Cornea, MD    Family History Family History  Problem Relation Age of Onset  . Diabetes Mother   . Hypertension Mother   . Asthma Father   . Hyperlipidemia Father     Social History Social History   Tobacco Use  . Smoking status: Current Every Day Smoker    Packs/day: 1.00    Types: Cigarettes  . Smokeless tobacco: Never Used  Substance Use Topics  . Alcohol use: Yes    Comment: states "too much"  . Drug use: Yes    Types: Cocaine, Marijuana    Comment: marijuana the other day     Allergies   Percocet [oxycodone-acetaminophen]  and Azithromycin   Review of Systems Review of Systems   Physical Exam Triage Vital Signs ED Triage Vitals  Enc Vitals Group     BP 04/30/19 0921 (!) 167/100     Pulse Rate 04/30/19 0921 100     Resp 04/30/19 0921 18     Temp 04/30/19 0921 98.2 F (36.8 C)     Temp Source 04/30/19 0921 Temporal     SpO2 04/30/19 0921 100 %     Weight --      Height --      Head Circumference --      Peak Flow --      Pain Score 04/30/19 0928 0     Pain Loc --      Pain Edu? --      Excl. in GC? --    No data found.  Updated Vital Signs BP (!) 167/100 (BP Location: Left Arm)   Pulse 100   Temp 98.2 F (36.8 C) (Temporal)   Resp 18   SpO2 100%   Visual Acuity Right Eye Distance:   Left Eye Distance:   Bilateral Distance:    Right Eye Near:   Left Eye Near:    Bilateral Near:     Physical Exam Vitals signs and nursing note reviewed.  Constitutional:      General: Kelsey Poole is not in acute distress.    Appearance: Normal appearance. Kelsey Poole  is not ill-appearing, toxic-appearing or diaphoretic.  HENT:     Head: Normocephalic and atraumatic.     Nose: Nose normal.  Eyes:     Conjunctiva/sclera: Conjunctivae normal.  Cardiovascular:     Rate and Rhythm: Normal rate.  Pulmonary:     Effort: Pulmonary effort is normal.  Musculoskeletal: Normal range of motion.  Skin:    General: Skin is warm and dry.     Findings: Rash present.  Neurological:     Mental Status: Kelsey Poole is alert.  Psychiatric:        Mood and Affect: Mood normal.      UC Treatments / Results  Labs (all labs ordered are listed, but only abnormal results are displayed) Labs Reviewed - No data to display  EKG   Radiology No results found.  Procedures Procedures (including critical care time)  Medications Ordered in UC Medications - No data to display  Initial Impression / Assessment and Plan / UC Course  I have reviewed the triage vital signs and the nursing notes.  Pertinent labs & imaging results that were available during my care of the patient were reviewed by me and considered in my medical decision making (see chart for details).     Rash consistent with scabies Treating with permethrin cream. Instructions given on how to use the medication. Refills given for recurrenc  Hydroxyzine for severe itching. Recommended getting rid of the couch which seems to be the source. Wash everything in hot water Final Clinical Impressions(s) / UC Diagnoses   Final diagnoses:  Scabies     Discharge Instructions     Treating you for scabies.  Make sure you put the cream on from head to toe leave on for 8 hours and then wash off in the a.m. I have given you a few refills that Kelsey Poole can repeat in a few weeks if needed. Hydroxyzine for severe itching Wash everything in hot water and I would recommend getting rid of the couch.    ED Prescriptions    Medication Sig Dispense Auth. Provider  permethrin (ELIMITE) 5 % cream Apply to affected area once  60 g Kemoni Quesenberry A, NP   hydrOXYzine (ATARAX/VISTARIL) 10 MG tablet Take 1 tablet (10 mg total) by mouth 3 (three) times daily as needed. 30 tablet Dahlia ByesBast, Fenix Rorke A, NP     Controlled Substance Prescriptions [place] Controlled Substance Registry consulted? Not Applicable   Janace ArisBast, Alayia Meggison A, NP 04/30/19 1041

## 2019-04-30 NOTE — Discharge Instructions (Signed)
Treating you for scabies.  Make sure you put the cream on from head to toe leave on for 8 hours and then wash off in the a.m. I have given you a few refills that she can repeat in a few weeks if needed. Hydroxyzine for severe itching Wash everything in hot water and I would recommend getting rid of the couch.

## 2019-04-30 NOTE — ED Triage Notes (Signed)
Pt presents to UC with c/o hornet sting on right wrist since 3 months ago. Pt reports itching starting on right wrist and progressing to bilateral forearms, legs, and bottom. Pt reports using benadryl and calamine lotion without relief. Pt has small open wounds on hands and some on legs and one on bottom. Pt has not seen a primary doctor for this

## 2019-05-19 ENCOUNTER — Encounter: Payer: Self-pay | Admitting: Pulmonary Disease

## 2019-05-19 ENCOUNTER — Other Ambulatory Visit: Payer: Self-pay

## 2019-05-19 ENCOUNTER — Other Ambulatory Visit: Payer: Self-pay | Admitting: Pulmonary Disease

## 2019-05-19 DIAGNOSIS — F17200 Nicotine dependence, unspecified, uncomplicated: Secondary | ICD-10-CM

## 2019-05-19 NOTE — Progress Notes (Signed)
Shared Decision-Making Visit for Lung Cancer Screening [id number] )  Lung Cancer Screening Criteria 68-4-years of age 58+ pack year smoking history No Recent History of coughing up blood   No Unexplained weight loss of > 15 pounds in the last 6 months. No Prior History Lung / other cancer  (Diagnosis within the last 5 years already requiring surveillance chest CT Scans). Pt is a current smoker, or former smoker who has quit within the last 15 years.  This patient meets the criterial noted above and is asymptomatic for any signs or symptoms of lung cancer.  The Shared Decision-Making Visit discussion included risks and benefits of screening, potential for follow up diagnostic testing for abnormal scans, potential for false positive tests, over diagnosis, and discussion about total radiation exposure. Patient stated willingness to undergo diagnostics and treatment as needed. Current smokers were counseled on smoking cessation as the single most powerful action they can take to decrease their risk of lung cancer, pulmonary disease, heart disease and stroke. They were given a resource card with information on receiving free nicotine replacement therapy , and information about free smoking cessation classes.  Pt understands that this scan is being paid for by a grant obtained by the [facility]. We discussed  that there is no guarantee of additional grant money from year to year as these grants are not guaranteed to programs on an annual basis.They have to be applied for and they are awarded based on county need, and utilization of the previous years funds..   Pt states she is doing well. She smokes  38 pack year history She has been provided a smoking cessation card. History of polysubstance abuse - cocaine and THC  Needs Pulm provider  Needs PCP    [facility] Card # [place], NP

## 2019-05-23 ENCOUNTER — Other Ambulatory Visit: Payer: Self-pay | Admitting: *Deleted

## 2019-05-23 DIAGNOSIS — Z122 Encounter for screening for malignant neoplasm of respiratory organs: Secondary | ICD-10-CM

## 2019-05-23 DIAGNOSIS — F17210 Nicotine dependence, cigarettes, uncomplicated: Secondary | ICD-10-CM

## 2019-06-03 ENCOUNTER — Ambulatory Visit
Admission: RE | Admit: 2019-06-03 | Discharge: 2019-06-03 | Disposition: A | Discharge status: Home / Self Care | Payer: PRIVATE HEALTH INSURANCE | Source: Ambulatory Visit | Attending: Acute Care | Admitting: Acute Care

## 2019-06-03 DIAGNOSIS — Z122 Encounter for screening for malignant neoplasm of respiratory organs: Secondary | ICD-10-CM

## 2019-06-03 DIAGNOSIS — F17210 Nicotine dependence, cigarettes, uncomplicated: Secondary | ICD-10-CM

## 2019-06-05 ENCOUNTER — Telehealth: Payer: Self-pay | Admitting: Acute Care

## 2019-06-05 DIAGNOSIS — I10 Essential (primary) hypertension: Secondary | ICD-10-CM

## 2019-06-05 NOTE — Telephone Encounter (Signed)
Pt informed of CT results per Eric Form, NP.  PT verbalized understanding.  Pt advised that we will f/u with her in 1 year to see if we can repeat scan through the grant.  Also, at [facility], Wyn Quaker, NP had wanted pt to be set up with a primary care MD and also have a pulmonary consult. Referral placed for primary care .  Pt scheduled with Dr Valeta Harms 06/19/19.

## 2019-06-19 ENCOUNTER — Encounter: Payer: Self-pay | Admitting: Pulmonary Disease

## 2019-06-19 ENCOUNTER — Ambulatory Visit (INDEPENDENT_AMBULATORY_CARE_PROVIDER_SITE_OTHER): Admitting: Pulmonary Disease

## 2019-06-19 ENCOUNTER — Other Ambulatory Visit: Payer: Self-pay

## 2019-06-19 VITALS — BP 172/102 | HR 103 | Ht 71.0 in | Wt 154.0 lb

## 2019-06-19 DIAGNOSIS — J438 Other emphysema: Secondary | ICD-10-CM

## 2019-06-19 DIAGNOSIS — I10 Essential (primary) hypertension: Secondary | ICD-10-CM

## 2019-06-19 DIAGNOSIS — F17200 Nicotine dependence, unspecified, uncomplicated: Secondary | ICD-10-CM

## 2019-06-19 DIAGNOSIS — J432 Centrilobular emphysema: Secondary | ICD-10-CM | POA: Diagnosis not present

## 2019-06-19 MED ORDER — ALBUTEROL SULFATE HFA 108 (90 BASE) MCG/ACT IN AERS: 1.0000 | INHALATION_SPRAY | Freq: Four times a day (QID) | RESPIRATORY_TRACT | 6 refills | Status: DC | PRN

## 2019-06-19 MED ORDER — VARENICLINE TARTRATE 0.5 MG X 11 & 1 MG X 42 PO MISC: ORAL | 0 refills | Status: DC

## 2019-06-19 MED ORDER — HYDROCHLOROTHIAZIDE 12.5 MG PO TABS
25.0000 mg | ORAL_TABLET | Freq: Every day | ORAL | 0 refills | Status: DC
Start: 2019-06-19 — End: 2019-08-15

## 2019-06-19 MED ORDER — ANORO ELLIPTA 62.5-25 MCG/INH IN AEPB: 1.0000 | INHALATION_SPRAY | Freq: Every day | RESPIRATORY_TRACT | 0 refills | Status: DC

## 2019-06-19 NOTE — Patient Instructions (Addendum)
Thank you for visiting Dr. Valeta Harms at [facility]. Today we recommend the following: Orders Placed This Encounter  Procedures  . Ambulatory referral to [facility]  . Pulmonary Function Test   Meds ordered this encounter  Medications  . varenicline (CHANTIX PAK) 0.5 MG X 11 & 1 MG X 42 tablet    Sig: Take one 0.5 mg tablet by mouth once daily for 3 days, then increase to one 0.5 mg tablet twice daily for 4 days, then increase to one 1 mg tablet twice daily.    Dispense:  53 tablet    Refill:  0  . albuterol (VENTOLIN HFA) 108 (90 Base) MCG/ACT inhaler    Sig: Inhale 1-2 puffs into the lungs every 6 (six) hours as needed for wheezing or shortness of breath.    Dispense:  18 g    Refill:  6  . hydrochlorothiazide (HYDRODIURIL) 12.5 MG tablet    Sig: Take 2 tablets (25 mg total) by mouth daily.    Dispense:  30 tablet    Refill:  0   Samples given of ANORO today.   We will set you up with new PCP for continued management of your hypertension.   Return in about 6 weeks (around 07/31/2019) for with APP or Dr. Valeta Harms.  You must quit smoking or vaping. This is the single most important thing that you can do to improve your lung health.   S = Set a quit date. T = Tell family, friends, and the people around you that you plan to quit. A = Anticipate or plan ahead for the tough times you'll face while quitting. R = Remove cigarettes and other tobacco products from your home, car, and work T = Talk to [place] about getting help to quit  If you need help feel free to reach out to our office, [place] [facility]oking Cessation Class: 450-202-2500, call 1-800-QUIT-NOW, or visit [email or web address]    Please do your part to reduce the spread of COVID-19.

## 2019-06-19 NOTE — Progress Notes (Signed)
Synopsis: Referred in October 2020 for SOB cough by No ref. provider found  Subjective:   PATIENT ID: Kelsey Poole GENDER: female DOB: 06-06-1961, MRN: [id number]  Chief Complaint  Patient presents with  . Pulmonary Consult    PMH of tobacco use, Smoker for 27 years, 0.75 ppd, started at age 58, stopped when she started having children. Stopped working in Jan. Both her and husband are home. Husband is a retired Administrator, [employer].  She has been taking her husband's blood pressure medications.  She takes about a quarter of his hypertension medication and gives the rest to him.  She also uses his albuterol inhaler on occasion.  She is still currently smoking three quarters a pack per day.  She had a low-dose lung cancer screening CT back in September which revealed centrilobular paraseptal emphysema.  She was referred for evaluation of potential underlying lung disease, possible COPD.  She also has not established with a primary care provider she would like a referral for this as well.  As for her respiratory symptoms she has dyspnea on exertion, cough, sputum production.  Shortness of breath on occasion.  These are better with use of her husband's albuterol inhaler.   Past Medical History:  Diagnosis Date  . Asthma   . Carpal tunnel syndrome   . Hypertension      Family History  Problem Relation Age of Onset  . Diabetes Mother   . Hypertension Mother   . Asthma Father   . Hyperlipidemia Father      Past Surgical History:  Procedure Laterality Date  . CESAREAN SECTION      Social History   Socioeconomic History  . Marital status: Significant Other    Spouse name: Not on file  . Number of children: 2  . Years of education: Not on file  . Highest education level: 12th grade  Occupational History  . Not on file  Social Needs  . Financial resource strain: Not on file  . Food insecurity    Worry: Not on file    Inability: Not on file  . Transportation needs    Medical: Yes   Non-medical: Yes  Tobacco Use  . Smoking status: Current Every Day Smoker    Packs/day: 0.75    Years: 38.00    Pack years: 28.50    Types: Cigarettes  . Smokeless tobacco: Never Used  Substance and Sexual Activity  . Alcohol use: Yes    Comment: states "too much"  . Drug use: Yes    Types: Cocaine, Marijuana    Comment: marijuana the other day  . Sexual activity: Yes  Lifestyle  . Physical activity    Days per week: Not on file    Minutes per session: Not on file  . Stress: Not on file  Relationships  . Social Musician on phone: Not on file    Gets together: Not on file    Attends religious service: Not on file    Active member of club or organization: Not on file    Attends meetings of clubs or organizations: Not on file    Relationship status: Not on file  . Intimate partner violence    Fear of current or ex partner: Not on file    Emotionally abused: Not on file    Physically abused: Not on file    Forced sexual activity: Not on file  Other Topics Concern  . Not on file  Social History Narrative  .  Not on file     Allergies  Allergen Reactions  . Percocet [Oxycodone-Acetaminophen] Itching    Patient reports developing a generalized itching over entire body after receiving in the ED  . Azithromycin Hives     Outpatient Medications Prior to Visit  Medication Sig Dispense Refill  . Albuterol Sulfate 108 (90 BASE) MCG/ACT AEPB Inhale 1 spray into the lungs daily as needed (asthma).    . permethrin (ELIMITE) 5 % cream Apply to affected area once 60 g 2  . hydrOXYzine (ATARAX/VISTARIL) 10 MG tablet Take 1 tablet (10 mg total) by mouth 3 (three) times daily as needed. 30 tablet 0  . lisinopril (PRINIVIL,ZESTRIL) 10 MG tablet Take 1 tablet (10 mg total) by mouth daily. (Patient not taking: Reported on 10/17/2018) 30 tablet 0   No facility-administered medications prior to visit.     Review of Systems  Constitutional: Positive for malaise/fatigue.  Negative for chills, fever and weight loss.  HENT: Negative for hearing loss, sore throat and tinnitus.   Eyes: Negative for blurred vision and double vision.  Respiratory: Positive for cough and shortness of breath. Negative for hemoptysis, sputum production, wheezing and stridor.   Cardiovascular: Negative for chest pain, palpitations, orthopnea, leg swelling and PND.  Gastrointestinal: Negative for abdominal pain, constipation, diarrhea, heartburn, nausea and vomiting.  Genitourinary: Negative for dysuria, hematuria and urgency.  Musculoskeletal: Negative for joint pain and myalgias.  Skin: Negative for itching and rash.  Neurological: Negative for dizziness, tingling, weakness and headaches.  Endo/Heme/Allergies: Negative for environmental allergies. Does not bruise/bleed easily.  Psychiatric/Behavioral: Negative for depression. The patient is not nervous/anxious and does not have insomnia.   All other systems reviewed and are negative.    Objective:  Physical Exam Vitals signs reviewed.  Constitutional:      General: She is not in acute distress.    Appearance: She is well-developed.  HENT:     Head: Normocephalic and atraumatic.     Mouth/Throat:     Pharynx: No oropharyngeal exudate.  Eyes:     Conjunctiva/sclera: Conjunctivae normal.     Pupils: Pupils are equal, round, and reactive to light.  Neck:     Vascular: No JVD.     Trachea: No tracheal deviation.     Comments: Loss of supraclavicular fat Cardiovascular:     Rate and Rhythm: Normal rate and regular rhythm.     Heart sounds: S1 normal and S2 normal.     Comments: Distant heart tones S2 greater than S1 Pulmonary:     Effort: No tachypnea or accessory muscle usage.     Breath sounds: No stridor. Decreased breath sounds (throughout all lung fields) present. No wheezing, rhonchi or rales.  Abdominal:     General: Bowel sounds are normal. There is no distension.     Palpations: Abdomen is soft.     Tenderness:  There is no abdominal tenderness.  Musculoskeletal:        General: Deformity (muscle wasting ) present.  Skin:    General: Skin is warm and dry.     Capillary Refill: Capillary refill takes less than 2 seconds.     Findings: No rash.  Neurological:     Mental Status: She is alert and oriented to person, place, and time.  Psychiatric:        Behavior: Behavior normal.      Vitals:   06/19/19 0941  BP: (!) 172/102  Pulse: (!) 103  SpO2: 100%  Weight: 154 lb (69.9 kg)  Height: 5\' 11"  (1.803 m)   100% on RA BMI Readings from Last 3 Encounters:  06/19/19 21.48 kg/m  10/17/18 22.04 kg/m  10/12/14 25.52 kg/m   Wt Readings from Last 3 Encounters:  06/19/19 154 lb (69.9 kg)  10/17/18 158 lb (71.7 kg)  10/12/14 183 lb (83 kg)     CBC    Component Value Date/Time   WBC 6.6 08/16/2016 1121   RBC 3.71 (L) 08/16/2016 1121   HGB 12.4 08/16/2016 1121   HCT 36.1 08/16/2016 1121   PLT 271 08/16/2016 1121   MCV 97.3 08/16/2016 1121   MCH 33.4 08/16/2016 1121   MCHC 34.3 08/16/2016 1121   RDW 13.8 08/16/2016 1121   LYMPHSABS 3.4 03/06/2014 1929   MONOABS 0.5 03/06/2014 1929   EOSABS 0.1 03/06/2014 1929   BASOSABS 0.0 03/06/2014 1929     Chest Imaging: Lung cancer screening CT 06/03/2019: Lung RADS 1S, centrilobular paraseptal emphysema. The patient's images have been independently reviewed by me.    Pulmonary Functions Testing Results: No flowsheet data found.  FeNO: None   Pathology: None   Echocardiogram: None   Heart Catheterization: None     Assessment & Plan:     ICD-10-CM   1. Centrilobular emphysema (HCC)  J43.2 Pulmonary Function Test    DISCONTINUED: varenicline (CHANTIX PAK) 0.5 MG X 11 & 1 MG X 42 tablet  2. Paraseptal emphysema (HCC)  J43.8 Pulmonary Function Test    DISCONTINUED: varenicline (CHANTIX PAK) 0.5 MG X 11 & 1 MG X 42 tablet  3. Smoker  F17.200 Pulmonary Function Test    Ambulatory referral to Family Practice    DISCONTINUED:  varenicline (CHANTIX PAK) 0.5 MG X 11 & 1 MG X 42 tablet  4. Essential hypertension  I10 Ambulatory referral to Family Practice    Discussion:  This is a 58 year old female longstanding history of tobacco abuse.  Currently enrolled in our lung cancer screening program.  Last images completed in September 2019.  Repeat 1 year follow-up.  She also has hypertension that is not treated at this time.  She likely has COPD based on radiographic imaging but has not had pulmonary function test.  Plan: Patient to have pulmonary function test We will refer to family practice Try to set her up with community health and wellness. I will start her on hydrochlorothiazide in the meantime as her systolic blood pressures in the 170s today diastolic greater than 100. Patient to return to our clinic in approximately 6 weeks whenever pulmonary function test can be completed to review these with the patient. We will start her on Chantix for smoking cessation. We discussed various methods for smoking cessation to include tapering method today in the office. I think quitting smoking is the most important thing that she can do for her pulmonary health. We also give her samples of Anoro Ellipta to see if this helps with her breathing.  Greater than 50% of this patient's 45-minute office visit was been face-to-face discussing above recommendations and treatment plan.    Current Outpatient Medications:  .  Albuterol Sulfate 108 (90 BASE) MCG/ACT AEPB, Inhale 1 spray into the lungs daily as needed (asthma)., Disp: , Rfl:  .  permethrin (ELIMITE) 5 % cream, Apply to affected area once, Disp: 60 g, Rfl: 2 .  albuterol (VENTOLIN HFA) 108 (90 Base) MCG/ACT inhaler, Inhale 1-2 puffs into the lungs every 6 (six) hours as needed for wheezing or shortness of breath., Disp: 18 g, Rfl: 6 .  hydrochlorothiazide (HYDRODIURIL) 12.5 MG tablet, Take 2 tablets (25 mg total) by mouth daily., Disp: 30 tablet, Rfl: 0   Garner Nash, DO [facility] 06/19/2019 10:05 AM

## 2019-06-23 ENCOUNTER — Telehealth: Payer: Self-pay | Admitting: Acute Care

## 2019-06-23 ENCOUNTER — Telehealth: Payer: Self-pay | Admitting: Pulmonary Disease

## 2019-06-23 DIAGNOSIS — Z7689 Persons encountering health services in other specified circumstances: Secondary | ICD-10-CM

## 2019-06-23 NOTE — Telephone Encounter (Signed)
PCCM:  Lets give her samples of breztri.  [facility] 06/23/2019 5:27 PM

## 2019-06-23 NOTE — Telephone Encounter (Signed)
While on the phone with the patient for an unrelated matter, she wanted to discuss a bill she received for the lung cancer screening program.   She stated that she received a bill from her insurance for $300+ for the CT scan she had done on 06/04/19. She was under the impression that this program was free. When she was here for her appointment, she gave the check-in person a copy of her voucher and her insurance card. I don't see where anything was scanned into her chart.   She wanted to speak with someone in billing about this.   Kathlee Nations, is there anywhere you can help?  Thanks!

## 2019-06-23 NOTE — Telephone Encounter (Signed)
Spoke with patient. She stated that she was prescribed Anoro last week at her OV on 10/15. She was given two samples. She has tried to use the Anoro but she stated it caused her to have a sore throat and increased her cough.   She wants to know if she could be switching over to a "mist" type of inhaler.   [facility] [address].   Dr. Valeta Harms, please advise. Thanks!

## 2019-06-24 MED ORDER — BREZTRI AEROSPHERE 160-9-4.8 MCG/ACT IN AERO: 2.0000 | INHALATION_SPRAY | Freq: Two times a day (BID) | RESPIRATORY_TRACT | 0 refills | Status: DC

## 2019-06-24 NOTE — Telephone Encounter (Signed)
Called and spoke to patient. Relayed message from Dr. Valeta Harms to patient. Patient verbalized understanding and will be coming to pick up samples of Breztri. Samples placed up front for patient pickup.  Patient also stated she is concerned about the bill she has received for over $300. Patient doesn't know if insurance wasn't processed or issue there may be. Patient stated she also thought she was on a free program.  Routing message to Kathlee Nations to help with the billing side of things.

## 2019-06-26 ENCOUNTER — Telehealth (HOSPITAL_COMMUNITY): Payer: Self-pay | Admitting: *Deleted

## 2019-06-26 ENCOUNTER — Telehealth: Payer: Self-pay | Admitting: Pulmonary Disease

## 2019-06-26 NOTE — Telephone Encounter (Signed)
LMTCB

## 2019-06-26 NOTE — Telephone Encounter (Signed)
Patient called and spoke with Nevin Bloodgood in [facility] in regards to bill received from Dr. Juline Patch office.   Called patient back. No one answered phone. Left voicemail for patient to call me back.   Patient returned my phone call. Explained to patient that her bill from Dr. Juline Patch office is not covered by [facility] or the free lung cancer screening. Explained to patient that [facility] only covers the breast exam, Pap smear, mammogram, and needed follow-up. Let her know the lung screening only covers the low dose CT screening. Told patient she will need to call Dr. Juline Patch office and speak to someone in insurance and billing. Patient stated she understands what is covered and will follow-up with Dr. Juline Patch office.

## 2019-06-27 NOTE — Telephone Encounter (Signed)
06/27/2019 1504  Difficult to fully state based off information provided today.  If patient is unable to obtain baseline vital signs there is a wide differential could be causing her symptoms.  She could still be having elevated blood pressures that are poorly controlled.  She likely needs to seek in person evaluation at an urgent care or an emergency room.  If patient continues to have concerns regarding this she can be scheduled as a televisit with an APP to further discuss.  Ultimately though I believe she needs to seek in person evaluation at an urgent care or emergency room.  We will also route to Burman Nieves for follow-up to ensure that patient is getting set up with community health and wellness or a family practice for better patient care.  Wyn Quaker FNP

## 2019-06-27 NOTE — Telephone Encounter (Signed)
Call made to patient, made her aware she will need to bring in a copy of the bill. She states she received it through a text message from [facility]. She reports she sent a message for information and would let us know what she found out.   She reports BI started her n HCTZ and she has developed a headache. She reports she feels warm all over. Unable to check her temp. BI sent in HCTZ until she is able to find a PCP BP at time of visit 172/102. She reports she has felt "weird'' since starting the HCTZ unable to explain what weird feels like. She started taking it Tuesday and has a headache everyday. Unable to check BP due to machine acting up.   BM please advise. Patient does not feel right since taking HCTZ, now having headaches, and feeling warm.

## 2019-06-27 NOTE — Telephone Encounter (Signed)
Call returned to patient, made aware of BM recommendations. She states she does not have transportation and she is not feeling well enough to be getting on the bus. She states she will call back and address when she is feeling better. She reports she took a advil in hopes it will relieve the headache. I made her aware even though it may relieve the headache it does not address her blood pressure however she declined to make an appt. Instructed to call 911 or go to urgent if she does receive any relief. Voiced understanding. Nothing further needed at this time.

## 2019-06-27 NOTE — Telephone Encounter (Signed)
Called and spoke with Patient.  Patient stated she requested a BP med refill to [facility]. Hydrochlorothiazide prescription #30, no refills, sent to requested pharmacy by Dr Valeta Harms, 06/19/19.  Patient stated she has received a bill of $395 for OV with Dr. Valeta Harms.  Patient was seen in office 06/19/19, by Dr Valeta Harms.   Kathlee Nations, can you assist in helping Patient billing questions?

## 2019-07-01 ENCOUNTER — Ambulatory Visit (INDEPENDENT_AMBULATORY_CARE_PROVIDER_SITE_OTHER): Admitting: Primary Care

## 2019-07-01 ENCOUNTER — Encounter: Payer: Self-pay | Admitting: Primary Care

## 2019-07-01 ENCOUNTER — Telehealth: Payer: Self-pay | Admitting: Pulmonary Disease

## 2019-07-01 DIAGNOSIS — I10 Essential (primary) hypertension: Secondary | ICD-10-CM | POA: Insufficient documentation

## 2019-07-01 DIAGNOSIS — J432 Centrilobular emphysema: Secondary | ICD-10-CM

## 2019-07-01 NOTE — Patient Instructions (Addendum)
Take HCTZ 12.5mg  every other day Check BP in 2-3 days and report any abnormal readings Follow up with PCP Pulmonary function testing scheduled for December

## 2019-07-01 NOTE — Progress Notes (Signed)
Virtual Visit via Telephone Note  I connected with Kelsey Poole on 07/01/19 at  3:00 PM EDT by telephone and verified that I am speaking with the correct person using two identifiers.  Location: Patient: At bus stop (difficult to hear patient) Provider: Office   I discussed the limitations, risks, security and privacy concerns of performing an evaluation and management service by telephone and the availability of in person appointments. I also discussed with the patient that there may be a patient responsible charge related to this service. The patient expressed understanding and agreed to proceed.   History of Present Illness: 58 year old female, current every day smoker (20+ pack years). PMH significant for centrilobular emphysema, hypertension. Patient of Dr. Valeta Harms, seen for initial consult on 06/19/19. Started on Anoro 1 puff daily and Chantix. She was also placed on hydrochlorothiazide 25mg  for elevated blood pressure of 170/100 until she can establish with PCP. Needs PFTs, strongly encouraged smoking cessation.  07/01/2019 Patient contacted today for televisit for medication questions. She reports low BP reading since starting HCTZ. Associated fatigue, dizziness and sweating. States that a friend, who is a Marine scientist, checked her blood pressure and it was 93/60. She has been taking 25mg  HCTZ and states that she is going to cut back to 12.5mg  every other day. She has an appointment with new PCP on November 13th. Needs refill of blood pressure medication.    Observations/Objective:  - No shortness of breath, wheezing or cough noted during phone conversation  Assessment and Plan:  HTN: - Reports low BP reading and symptoms of hypotension - Recommended taking 1 tab HCTZ (12.5mg ) once daily but patient states she will only take 1 tab every other day  - Advised her to check her blood pressure in 2-3 days and report any abnormal readings - Establishing with new PCP in November    Emphysema: - Continue Anoro 1 puff daily - PFTs scheduled for December   Follow Up Instructions:   - Has an apt for PFTs and OV on 12/31 with Dr. Valeta Harms  I discussed the assessment and treatment plan with the patient. The patient was provided an opportunity to ask questions and all were answered. The patient agreed with the plan and demonstrated an understanding of the instructions.   The patient was advised to call back or seek an in-person evaluation if the symptoms worsen or if the condition fails to improve as anticipated.  I provided 15 minutes of non-face-to-face time during this encounter.   [name], NP

## 2019-07-01 NOTE — Telephone Encounter (Signed)
Called and spoke to patient.  Patient stated that the hydrochlorothiazide that was written by Dr. Valeta Harms is not working with her.  Patient is being set up with new PCP but that appointment isn't until 07/18/2019. Patient stated that she was previously having back issues but that is better and she doesn't feel that contributed. Patient stated she has to walk 12 blocks to the grocery store and that during the walk it took her a lot longer than usual and she began sweating profusely and had to sit down several times due to feeling lightheaded and thought she would pass out. Patient stated that this previously happened when she took her husband's blood pressure medication.  Patient stated she thinks she will start taking 1 pill every other day.  Encouraged patient to talk with provider and come up with plan for medication. Patient stated that she has a blood pressure cuff but that she hasn't been taking her blood pressure. Scheduled patient for a telephone visit with NP.  Nothing further needed at this time.

## 2019-07-02 NOTE — Telephone Encounter (Signed)
PCCM: Agreed. Patient needs primary care provider.  Garner Nash, DO [facility] 07/02/2019 7:15 AM

## 2019-07-02 NOTE — Telephone Encounter (Signed)
PCC's, Can you look into this.  Patient is in desperate need of a primary care appointment.  Can we call and get the patient scheduled?  Wyn Quaker FNP

## 2019-07-02 NOTE — Telephone Encounter (Signed)
BJ,  Is there a point of contact at community health and wellness that can contact the patient to get her in to be evaluated.  Clearly she needs a gatekeeper for her health care and a establish quality primary care provider who could help address her needs.Aaron Edelman

## 2019-07-02 NOTE — Addendum Note (Signed)
Addended by: Vivia Ewing on: 07/02/2019 09:12 AM   Modules accepted: Orders

## 2019-07-02 NOTE — Telephone Encounter (Signed)
I went ahead and placed a order for PCP care with [facility].

## 2019-07-02 NOTE — Telephone Encounter (Signed)
Thank you  Garner Nash, DO [facility] 07/02/2019 12:56 PM

## 2019-07-02 NOTE — Telephone Encounter (Signed)
Patient has appointment with Dr. Jerilee Hoh to establish care on 07/18/2019 at 0930.

## 2019-07-03 NOTE — Progress Notes (Signed)
PCCM: Agree. Thanks for speaking with her. Garner Nash, DO [facility] 07/03/2019 10:33 AM

## 2019-07-04 NOTE — Telephone Encounter (Signed)
This has been addressed. Please see phone note from 10.27.   Nothing further needed at this time.

## 2019-07-09 NOTE — Telephone Encounter (Signed)
I do not see a $300 bill, I only see a copay from her last office visit.  She will need to call billing as I can't see the balance she is talking about.

## 2019-07-10 NOTE — Telephone Encounter (Signed)
Called and spoke to patient. Relayed message from Kathlee Nations that from our end the bill is not seen.  Patient stated it was electronic that she saw the cost and has not received a paper bill. Patient stated maybe it was a scam.  Gave patient the number for billing [phone]. Patient stated she will call billing if she receives anything else.  Nothing further needed at this time.

## 2019-07-18 ENCOUNTER — Ambulatory Visit: Payer: No Typology Code available for payment source | Admitting: Internal Medicine

## 2019-07-22 ENCOUNTER — Telehealth: Payer: Self-pay | Admitting: Pulmonary Disease

## 2019-07-22 NOTE — Telephone Encounter (Signed)
Ok, yes all we can do is advise she monitor her blood pressure. Recommend holding if <100/60. She needs to make another appointment with her PCP and I advise she be seen in the next 1-2 weeks. It is very important they check her blood pressure and she will likely need labs to monitor her electrolytes. Thank you

## 2019-07-22 NOTE — Telephone Encounter (Signed)
Call return to patient, confirmed DOB, she states her BP medication is making her sicker. She took Hydroxyzine 10mg  yesterday morning and she said it made her sicker. She said she normally walks to the store which normally takes her 15 minutes, it took her 2 hours to get home because her BP was too low. She states her BP 99/72. She states she is only going to take the Hydroxyzine 10mg  once a week. I inquired if she had been contacted regarding the appt for a PCP. She states she had an appt and cancelled it because it was on Friday the 13th. I made her aware to be sure to call them back to r/s so they can address her BP. I made her aware I would send this message to a provider. She states I can do whatever I want but she is going to listen to her body and only take 1 tab of Hydroxyzine 10mg  per week and she will go from there. I encouraged her to continue to monitor her BP and if it is too low or too high to be sure to contact us. Voiced understanding.   Will route message to provider as FYI.

## 2019-07-22 NOTE — Telephone Encounter (Signed)
Noted. Thanks.

## 2019-08-14 ENCOUNTER — Telehealth: Payer: Self-pay | Admitting: Pulmonary Disease

## 2019-08-14 NOTE — Telephone Encounter (Signed)
Spoke with pt, she states the blood pressure medication (HCTZ) is dropping her blood pressure too low and quick. She needs something that's not so strong. She feels disoriented and feels like she is going to pass out. She states she tried her husbands lisinopril and it worked Engineer, manufacturing. She tried a half of HCTZ and it was still too strong. BI please advise on next steps.

## 2019-08-15 MED ORDER — LISINOPRIL 5 MG PO TABS: 5.0000 mg | ORAL_TABLET | Freq: Every day | ORAL | 0 refills | Status: DC

## 2019-08-15 NOTE — Telephone Encounter (Signed)
thanks

## 2019-08-15 NOTE — Telephone Encounter (Signed)
Agreed. Should be managed by PCP. She must keep these scheduled appointments. BLI

## 2019-08-15 NOTE — Telephone Encounter (Signed)
Discontinue HCTZ. Trial 5mg  lisinopril once daily. Needs visit in 1 week with BI or NP to check blood pressure and labs

## 2019-08-15 NOTE — Telephone Encounter (Signed)
Checked on patient to see how she was doing this morning. She stated that she has stopped the medication completely due to how she was feeling. She took 1/4th of the hctz and her BP plummeted on the first day. She is feeling much better. She could not remember the dosage of the Lisinopril she took from her husband but she stated that the medication worked great for her. I did advise her that sometimes this medication will cause a cough in patients. She verbalized understanding.   Beth, can you please advise since BI is not in the office? Thanks!

## 2019-08-15 NOTE — Telephone Encounter (Signed)
She really also needs to establish with PCP. Can we put referral in

## 2019-08-15 NOTE — Telephone Encounter (Signed)
Called and spoke with pt letting her know the info stated by [facility] to discontinue the HCTZ and do a trial of lisinopril. Stated to her that we needed to schedule f/u in 1 week to reassess BP and she verbalized understanding. appt scheduled for pt.  Asked pt if she had a new PCP and she said she had an appt scheduled but had to reschedule it. Pt said she does have an appt with new PCP in January.  I have discontinued HCTZ off of pt's med list and sent Lisinopril Rx to pt's pharmacy. Nothing further needed.

## 2019-08-21 ENCOUNTER — Ambulatory Visit: Payer: No Typology Code available for payment source | Admitting: Primary Care

## 2019-09-04 ENCOUNTER — Ambulatory Visit: Payer: No Typology Code available for payment source | Admitting: Pulmonary Disease

## 2019-09-12 ENCOUNTER — Ambulatory Visit: Payer: No Typology Code available for payment source | Admitting: Internal Medicine

## 2019-11-14 ENCOUNTER — Other Ambulatory Visit: Payer: Self-pay

## 2019-11-14 ENCOUNTER — Ambulatory Visit: Attending: Internal Medicine | Admitting: Internal Medicine

## 2019-12-12 ENCOUNTER — Other Ambulatory Visit: Payer: Self-pay | Admitting: Primary Care

## 2019-12-15 ENCOUNTER — Other Ambulatory Visit: Payer: Self-pay | Admitting: Primary Care

## 2019-12-15 NOTE — Telephone Encounter (Signed)
She needs to establish with primary care for HTN management. I told her that would be a one time medication refill and had asked her to follow up for bp check in one week which she did not do. I will not fill.

## 2020-04-23 ENCOUNTER — Other Ambulatory Visit: Payer: Self-pay

## 2020-04-23 ENCOUNTER — Encounter: Payer: Self-pay | Admitting: Family Medicine

## 2020-04-23 ENCOUNTER — Ambulatory Visit (INDEPENDENT_AMBULATORY_CARE_PROVIDER_SITE_OTHER): Admitting: Family Medicine

## 2020-04-23 VITALS — BP 122/80 | HR 90 | Temp 98.0°F

## 2020-04-23 DIAGNOSIS — L659 Nonscarring hair loss, unspecified: Secondary | ICD-10-CM

## 2020-04-23 DIAGNOSIS — J432 Centrilobular emphysema: Secondary | ICD-10-CM | POA: Diagnosis not present

## 2020-04-23 DIAGNOSIS — R10819 Abdominal tenderness, unspecified site: Secondary | ICD-10-CM

## 2020-04-23 DIAGNOSIS — Z131 Encounter for screening for diabetes mellitus: Secondary | ICD-10-CM | POA: Diagnosis not present

## 2020-04-23 DIAGNOSIS — F1010 Alcohol abuse, uncomplicated: Secondary | ICD-10-CM | POA: Insufficient documentation

## 2020-04-23 DIAGNOSIS — Z1159 Encounter for screening for other viral diseases: Secondary | ICD-10-CM

## 2020-04-23 DIAGNOSIS — Z1231 Encounter for screening mammogram for malignant neoplasm of breast: Secondary | ICD-10-CM

## 2020-04-23 DIAGNOSIS — I10 Essential (primary) hypertension: Secondary | ICD-10-CM | POA: Diagnosis not present

## 2020-04-23 DIAGNOSIS — F17200 Nicotine dependence, unspecified, uncomplicated: Secondary | ICD-10-CM | POA: Insufficient documentation

## 2020-04-23 DIAGNOSIS — Z13220 Encounter for screening for lipoid disorders: Secondary | ICD-10-CM

## 2020-04-23 MED ORDER — LISINOPRIL 5 MG PO TABS: 5.0000 mg | ORAL_TABLET | Freq: Every day | ORAL | 1 refills | Status: DC

## 2020-04-23 NOTE — Progress Notes (Signed)
Kelsey Poole DOB: [birth year] Encounter date: 04/23/2020  This isa 59 y.o. female who presents to establish care. Chief Complaint  Patient presents with  . New Patient (Initial Visit)    History of present illness: Patient missed bus and was late for appointment, so we were somewhat rushed today with visit.   HTN: started on hctz 25mg  and had issues with low bp with this. Decreased to 12.5mg  (back in 06/2019) but she stopped taking this altogether. Has been doing better with the lisinopril 5mg . Not even taking this every day.   She really would like urine test - having some back pain recently. No discomfort with urination. Just back on left side.   Has been diagnosed with herpes - this was way back in 90's. Not had medication for it. Knows when she has it. Abstains from intercourse with husband when this occurs. Last flare was a month ago. Doesn't get them too often - maybe every 3 months or so - gets itchy bump in vaginal area; sometimes more towards anal area. Tries to squeeze bump and then puts tissue on area.   Has had mammogram last year. States she never heard about results of this and was concerned.   Emphysema: anoro, chantix in past. Does still smoke. Doesn't "feel" like she has all that. Husband died from lung cancer and doesn't feel like she has all this. Doesn't want to put these products into her system that she doesn't need.   Drinks alcohol daily. Never been to rehab, but does feel she has a problem. Does not want to stop drinking. States she never drinks in the morning before work.  Only after work.  She drinks about a pint a day.  She has some interest in quitting smoking, but not sure she is ready to fully quit yet.  Never tried Chantix.  States that ever since she was working in 07/2019 and had 2-hour nap, her breaks off of the top of her head.  Can grow hair.  Would like to see dermatologist for further evaluation.   Past Medical History:  Diagnosis Date  .  Asthma   . Carpal tunnel syndrome   . Hypertension    Past Surgical History:  Procedure Laterality Date  . CESAREAN SECTION     Allergies  Allergen Reactions  . Percocet [Oxycodone-Acetaminophen] Itching    Patient reports developing a generalized itching over entire body after receiving in the ED  . Azithromycin Hives   Current Meds  Medication Sig  . albuterol (VENTOLIN HFA) 108 (90 Base) MCG/ACT inhaler Inhale 1-2 puffs into the lungs every 6 (six) hours as needed for wheezing or shortness of breath.  . Albuterol Sulfate 108 (90 BASE) MCG/ACT AEPB Inhale 1 spray into the lungs daily as needed (asthma).  . lisinopril (ZESTRIL) 5 MG tablet Take 1 tablet (5 mg total) by mouth daily.  . [DISCONTINUED] lisinopril (ZESTRIL) 5 MG tablet Take 1 tablet (5 mg total) by mouth daily.   Social History   Tobacco Use  . Smoking status: Current Every Day Smoker    Packs/day: 0.75    Years: 38.00    Pack years: 28.50    Types: Cigarettes  . Smokeless tobacco: Never Used  Substance Use Topics  . Alcohol use: Yes    Comment: states "too much"   Family History  Problem Relation Age of Onset  . Diabetes Mother   . Hypertension Mother   . Asthma Father   . Hyperlipidemia Father  Review of Systems  Constitutional: Negative for chills, fatigue and fever.  Respiratory: Negative for cough, chest tightness, shortness of breath and wheezing.   Cardiovascular: Negative for chest pain, palpitations and leg swelling.  Genitourinary: Positive for flank pain. Negative for difficulty urinating, dysuria and frequency.    Objective:  BP 122/80 (BP Location: Left Arm, Patient Position: Sitting, Cuff Size: Normal)   Pulse 90   Temp 98 F (36.7 C) (Oral)   SpO2 98%       BP Readings from Last 3 Encounters:  04/23/20 122/80  06/19/19 (!) 172/102  04/30/19 (!) 167/100   Wt Readings from Last 3 Encounters:  06/19/19 154 lb (69.9 kg)  10/17/18 158 lb (71.7 kg)  10/12/14 183 lb (83 kg)     Physical Exam Constitutional:      General: She is not in acute distress.    Appearance: She is well-developed.  Cardiovascular:     Rate and Rhythm: Normal rate and regular rhythm.     Heart sounds: Normal heart sounds. No murmur heard.  No friction rub.  Pulmonary:     Effort: Pulmonary effort is normal. No respiratory distress.     Breath sounds: Normal breath sounds. No wheezing or rales.  Musculoskeletal:     Right lower leg: No edema.     Left lower leg: No edema.  Neurological:     Mental Status: She is alert and oriented to person, place, and time.  Psychiatric:        Behavior: Behavior normal.     Assessment/Plan:  1. Essential hypertension Blood pressures well controlled on current lisinopril dose.  This was refilled for her today.  We will check baseline blood work.  Follow-up pending these results. - CBC with Differential/Platelet; Future - Comprehensive metabolic panel; Future - lisinopril (ZESTRIL) 5 MG tablet; Take 1 tablet (5 mg total) by mouth daily.  Dispense: 90 tablet; Refill: 1 - Comprehensive metabolic panel - CBC with Differential/Platelet  2. Centrilobular emphysema (HCC) Patient disagrees with this diagnosis, but we did review that there was evidence of emphysema noted on her CT scan of the lungs.  She has some interest in cutting back on smoking, but is not entirely ready to quit yet.  She has quit in the past (during pregnancy).  She has some desire to quit for her grandkids.  We will continue to discuss.  We reviewed today that lungs can feel like they are functioning normally even with a significant amount of lung damage, so although she is not having active symptoms, does not mean that her lungs are not continuing to be damaged.  We also discussed that quitting smoking can allow the lungs to heal.  3. Lipid screening - Lipid panel; Future - Lipid panel  4. Screening for diabetes mellitus We will check blood work today.  5. Encounter for  hepatitis C screening test for low risk patient - Hepatitis C antibody; Future - Hepatitis C antibody  6. Encounter for screening mammogram for malignant neoplasm of breast - MM DIGITAL SCREENING BILATERAL; Future  7. Left flank tenderness Checking urine today per patient request.  No urinary symptoms, but does have intermittent flank tenderness. - Urinalysis; Future - Urine Culture; Future - Urine Culture - Urinalysis  8. Hair loss - Ambulatory referral to Dermatology  9. Tobacco dependence See above.  10. Alcohol abuse She is not interested in quitting drinking at this point.  We did discuss that regular heavy consumption of alcohol will put strain on  the liver.  She understands health benefits of quitting, but also states that if she is not willing to quit, it is not worth trying or going to rehab program.  I do agree with this.  We will have to continue to address desire and willingness for cutting back on alcohol intake.  Return for pending labs.  Theodis Shove, MD

## 2020-04-25 LAB — URINALYSIS
Bilirubin Urine: NEGATIVE
Glucose, UA: NEGATIVE
Hgb urine dipstick: NEGATIVE
Ketones, ur: NEGATIVE
Leukocytes,Ua: NEGATIVE
Nitrite: NEGATIVE
Specific Gravity, Urine: 1.007 (ref 1.001–1.03)
pH: 5.5 (ref 5.0–8.0)

## 2020-04-25 LAB — URINE CULTURE
MICRO NUMBER:: 10853093
SPECIMEN QUALITY:: ADEQUATE

## 2020-04-26 LAB — COMPREHENSIVE METABOLIC PANEL
AG Ratio: 2 (calc) (ref 1.0–2.5)
ALT: 12 U/L (ref 6–29)
AST: 30 U/L (ref 10–35)
Albumin: 4.3 g/dL (ref 3.6–5.1)
Alkaline phosphatase (APISO): 72 U/L (ref 37–153)
BUN/Creatinine Ratio: 14 (calc) (ref 6–22)
BUN: 17 mg/dL (ref 7–25)
CO2: 19 mmol/L — ABNORMAL LOW (ref 20–32)
Calcium: 9.3 mg/dL (ref 8.6–10.4)
Chloride: 104 mmol/L (ref 98–110)
Creat: 1.24 mg/dL — ABNORMAL HIGH (ref 0.50–1.05)
Globulin: 2.2 g/dL (calc) (ref 1.9–3.7)
Glucose, Bld: 80 mg/dL (ref 65–99)
Potassium: 4.5 mmol/L (ref 3.5–5.3)
Sodium: 136 mmol/L (ref 135–146)
Total Bilirubin: 0.3 mg/dL (ref 0.2–1.2)
Total Protein: 6.5 g/dL (ref 6.1–8.1)

## 2020-04-26 LAB — CBC WITH DIFFERENTIAL/PLATELET
Absolute Monocytes: 308 cells/uL (ref 200–950)
Basophils Absolute: 28 cells/uL (ref 0–200)
Basophils Relative: 0.5 %
Eosinophils Absolute: 77 cells/uL (ref 15–500)
Eosinophils Relative: 1.4 %
HCT: 31.5 % — ABNORMAL LOW (ref 35.0–45.0)
Hemoglobin: 11 g/dL — ABNORMAL LOW (ref 11.7–15.5)
Lymphs Abs: 1975 cells/uL (ref 850–3900)
MCH: 37.4 pg — ABNORMAL HIGH (ref 27.0–33.0)
MCHC: 34.9 g/dL (ref 32.0–36.0)
MCV: 107.1 fL — ABNORMAL HIGH (ref 80.0–100.0)
MPV: 9.9 fL (ref 7.5–12.5)
Monocytes Relative: 5.6 %
Neutro Abs: 3113 cells/uL (ref 1500–7800)
Neutrophils Relative %: 56.6 %
Platelets: 261 10*3/uL (ref 140–400)
RBC: 2.94 10*6/uL — ABNORMAL LOW (ref 3.80–5.10)
RDW: 13.6 % (ref 11.0–15.0)
Total Lymphocyte: 35.9 %
WBC: 5.5 10*3/uL (ref 3.8–10.8)

## 2020-04-26 LAB — LIPID PANEL
Cholesterol: 216 mg/dL — ABNORMAL HIGH (ref <200)
HDL: 98 mg/dL (ref >=50)
LDL Cholesterol (Calc): 87 mg/dL (calc)
Non-HDL Cholesterol (Calc): 118 mg/dL (calc) (ref <130)
Total CHOL/HDL Ratio: 2.2 (calc) (ref <5.0)
Triglycerides: 225 mg/dL — ABNORMAL HIGH (ref <150)

## 2020-04-26 LAB — HEPATITIS C ANTIBODY
Hepatitis C Ab: NONREACTIVE
SIGNAL TO CUT-OFF: 0.01 (ref <1.00)

## 2020-04-29 MED ORDER — CEPHALEXIN 500 MG PO CAPS: 500.0000 mg | ORAL_CAPSULE | Freq: Two times a day (BID) | ORAL | 0 refills | Status: DC

## 2020-04-29 NOTE — Addendum Note (Signed)
Addended by: Johnella Moloney on: 04/29/2020 11:52 AM   Modules accepted: Orders

## 2020-04-30 ENCOUNTER — Other Ambulatory Visit: Payer: Self-pay | Admitting: Family Medicine

## 2020-04-30 DIAGNOSIS — D649 Anemia, unspecified: Secondary | ICD-10-CM

## 2020-04-30 DIAGNOSIS — F1010 Alcohol abuse, uncomplicated: Secondary | ICD-10-CM

## 2020-04-30 NOTE — Progress Notes (Signed)
Labs ordered.

## 2020-05-28 ENCOUNTER — Other Ambulatory Visit: Payer: Self-pay

## 2020-05-28 ENCOUNTER — Ambulatory Visit (INDEPENDENT_AMBULATORY_CARE_PROVIDER_SITE_OTHER): Admitting: Family Medicine

## 2020-05-28 ENCOUNTER — Encounter: Payer: Self-pay | Admitting: Family Medicine

## 2020-05-28 VITALS — BP 108/72 | HR 107 | Temp 97.7°F | Ht 71.0 in | Wt 146.3 lb

## 2020-05-28 DIAGNOSIS — F1010 Alcohol abuse, uncomplicated: Secondary | ICD-10-CM

## 2020-05-28 DIAGNOSIS — B373 Candidiasis of vulva and vagina: Secondary | ICD-10-CM | POA: Diagnosis not present

## 2020-05-28 DIAGNOSIS — B3731 Acute candidiasis of vulva and vagina: Secondary | ICD-10-CM

## 2020-05-28 DIAGNOSIS — I10 Essential (primary) hypertension: Secondary | ICD-10-CM

## 2020-05-28 DIAGNOSIS — D649 Anemia, unspecified: Secondary | ICD-10-CM

## 2020-05-28 DIAGNOSIS — Z23 Encounter for immunization: Secondary | ICD-10-CM | POA: Diagnosis not present

## 2020-05-28 NOTE — Progress Notes (Signed)
Kelsey Poole DOB: 09-04-1961 Encounter date: 05/28/2020  This is a 59 y.o. female who presents with Chief Complaint  Patient presents with  . Anemia  . Hypertension    History of present illness:  Last visit was 04/23/2020.  She was treated for UTI at that time.  Blood work showed some anemia, so follow-up labs were ordered. Back did feel better after she completed treatment. No current UTI symptoms.   Does have yeast infection. Using monistat right now. Not using regularly; lost applicator. This is helping and she does not wish to have any additional treatment for yeast infection. More itching; no discharge. No STD concern per patient.  Has had issues with low blood work in past.   Hasn't been taking the hctz. Was very weak with taking initial prescribed dose. Then switched to lisinopril 5mg  and just takes intermittently. She has done well with this. Really just taking about once a week.   Really wants to quit smoking. Not ready to quit drinking alcohol at this point.   States that she had Cologuard testing last year and it was negative.   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  Getting numbness in fingers and in toes. This is new; she isn't sure why. Not painful. No weakness.no neck pain.   Allergies  Allergen Reactions  . Percocet [Oxycodone-Acetaminophen] Itching    Patient reports developing a generalized itching over entire body after receiving in the ED  . Azithromycin Hives   Current Meds  Medication Sig  . albuterol (VENTOLIN HFA) 108 (90 Base) MCG/ACT inhaler Inhale 1-2 puffs into the lungs every 6 (six) hours as needed for wheezing or shortness of breath.  . lisinopril (ZESTRIL) 5 MG tablet Take 1 tablet (5 mg total) by mouth daily.  . [DISCONTINUED] Albuterol Sulfate 108 (90 BASE) MCG/ACT AEPB Inhale 1 spray into the lungs daily as needed (asthma).    Review of Systems  Constitutional: Negative for chills, fatigue and  fever.  Respiratory: Negative for cough, chest tightness, shortness of breath and wheezing.   Cardiovascular: Negative for chest pain, palpitations and leg swelling.  Musculoskeletal: Negative for back pain, neck pain and neck stiffness.  Skin:       Hair loss head  Neurological: Positive for numbness. Negative for dizziness, weakness and headaches.    Objective:  BP 108/72 (BP Location: Left Arm, Patient Position: Sitting, Cuff Size: Normal)   Pulse (!) 107   Temp 97.7 F (36.5 C) (Oral)   Ht 5\' 11"  (1.803 m)   Wt 146 lb 4.8 oz (66.4 kg)   BMI 20.40 kg/m   Weight: 146 lb 4.8 oz (66.4 kg)   BP Readings from Last 3 Encounters:  05/28/20 108/72  04/23/20 122/80  06/19/19 (!) 172/102   Wt Readings from Last 3 Encounters:  05/28/20 146 lb 4.8 oz (66.4 kg)  06/19/19 154 lb (69.9 kg)  10/17/18 158 lb (71.7 kg)    Physical Exam Constitutional:      General: She is not in acute distress.    Appearance: She is well-developed.  Cardiovascular:     Rate and Rhythm: Normal rate and regular rhythm.     Pulses:          Radial pulses are 2+ on the right side and 2+ on the left side.       Dorsalis pedis pulses are 2+ on the right side and 2+ on the left side.  Posterior tibial pulses are 2+ on the right side and 2+ on the left side.     Heart sounds: Normal heart sounds. No murmur heard.  No friction rub.     Comments: Less than 2-second cap refill on her fingers and toes. Pulmonary:     Effort: Pulmonary effort is normal. No respiratory distress.     Breath sounds: Normal breath sounds. No wheezing or rales.  Musculoskeletal:     Right lower leg: No edema.     Left lower leg: No edema.     Comments: No decrease in range of motion of the neck.  No pain with axial loading.  Negative Spurling's.  Neurological:     Mental Status: She is alert and oriented to person, place, and time.     Deep Tendon Reflexes:     Reflex Scores:      Tricep reflexes are 2+ on the right side  and 2+ on the left side.      Bicep reflexes are 2+ on the right side and 2+ on the left side.      Brachioradialis reflexes are 2+ on the right side and 2+ on the left side.    Comments: Full strength upper extremities.  Psychiatric:        Behavior: Behavior normal.     Assessment/Plan  1. Yeast vaginitis She is used.  I encouraged her to let me know if she feels more is needed as we could certainly add an oral Diflucan.  2. Need for Tdap vaccination Completed today - Tdap vaccine greater than or equal to 7yo IM  3. Anemia, unspecified type We will check additional lab work for anemia.  She has had some ongoing issues with anemia in the past.  We discussed that ongoing alcohol use is not helpful for her overall health, and anemia.  She is not ready to cut back on drinking at this point. - CBC with Differential/Platelet; Future - Vitamin B12; Future - Folate; Future - Iron, TIBC and Ferritin Panel; Future - Iron, TIBC and Ferritin Panel - Folate - Vitamin B12 - CBC with Differential/Platelet  4. Alcohol abuse See above.  I do worry about dietary intake.  She does usually have a good breakfast at home, but sometimes not eating as much through the rest of the day and upon arriving home.  We will start with blood work and consider further evaluation/discussion pending results. - Vitamin B1; Future - Vitamin B1  5.  Hypertension She is using lisinopril 5 mg intermittently.  We discussed that this is reasonable given well-controlled blood pressures.  We discussed that with quitting smoking, blood pressure may improve as well.  She will continue to monitor at home.   Return in about 6 months (around 11/25/2020) for physical exam.    Theodis Shove, MD

## 2020-05-28 NOTE — Patient Instructions (Signed)

## 2020-05-31 ENCOUNTER — Ambulatory Visit: Admitting: Family Medicine

## 2020-05-31 ENCOUNTER — Other Ambulatory Visit

## 2020-06-02 LAB — CBC WITH DIFFERENTIAL/PLATELET
Absolute Monocytes: 305 cells/uL (ref 200–950)
Basophils Absolute: 31 cells/uL (ref 0–200)
Basophils Relative: 0.5 %
Eosinophils Absolute: 128 cells/uL (ref 15–500)
Eosinophils Relative: 2.1 %
HCT: 33.2 % — ABNORMAL LOW (ref 35.0–45.0)
Hemoglobin: 11.6 g/dL — ABNORMAL LOW (ref 11.7–15.5)
Lymphs Abs: 1537 cells/uL (ref 850–3900)
MCH: 37.3 pg — ABNORMAL HIGH (ref 27.0–33.0)
MCHC: 34.9 g/dL (ref 32.0–36.0)
MCV: 106.8 fL — ABNORMAL HIGH (ref 80.0–100.0)
MPV: 10.2 fL (ref 7.5–12.5)
Monocytes Relative: 5 %
Neutro Abs: 4099 cells/uL (ref 1500–7800)
Neutrophils Relative %: 67.2 %
Platelets: 281 10*3/uL (ref 140–400)
RBC: 3.11 10*6/uL — ABNORMAL LOW (ref 3.80–5.10)
RDW: 13.5 % (ref 11.0–15.0)
Total Lymphocyte: 25.2 %
WBC: 6.1 10*3/uL (ref 3.8–10.8)

## 2020-06-02 LAB — IRON,TIBC AND FERRITIN PANEL
%SAT: 29 % (calc) (ref 16–45)
Ferritin: 320 ng/mL — ABNORMAL HIGH (ref 16–232)
Iron: 101 ug/dL (ref 45–160)
TIBC: 347 mcg/dL (calc) (ref 250–450)

## 2020-06-02 LAB — VITAMIN B1: Vitamin B1 (Thiamine): 7 nmol/L — ABNORMAL LOW (ref 8–30)

## 2020-06-02 LAB — FOLATE: Folate: 7.7 ng/mL

## 2020-06-02 LAB — VITAMIN B12: Vitamin B-12: 352 pg/mL (ref 200–1100)

## 2020-06-08 ENCOUNTER — Other Ambulatory Visit: Payer: Self-pay

## 2020-06-08 ENCOUNTER — Ambulatory Visit
Admission: RE | Admit: 2020-06-08 | Discharge: 2020-06-08 | Disposition: A | Discharge status: Home / Self Care | Source: Ambulatory Visit | Attending: Family Medicine | Admitting: Family Medicine

## 2020-06-08 DIAGNOSIS — Z1231 Encounter for screening mammogram for malignant neoplasm of breast: Secondary | ICD-10-CM

## 2020-07-16 ENCOUNTER — Telehealth: Payer: Self-pay | Admitting: Family Medicine

## 2020-07-16 NOTE — Telephone Encounter (Signed)
Pt call and want a note stating she need to ware a shield and not a mass because of her asthma at her job.

## 2020-07-19 ENCOUNTER — Other Ambulatory Visit: Payer: Self-pay

## 2020-07-19 ENCOUNTER — Other Ambulatory Visit

## 2020-07-19 DIAGNOSIS — E519 Thiamine deficiency, unspecified: Secondary | ICD-10-CM

## 2020-07-19 DIAGNOSIS — D649 Anemia, unspecified: Secondary | ICD-10-CM

## 2020-07-19 NOTE — Progress Notes (Signed)
Per PCP Thiamine and Fe only/thx dmf

## 2020-07-21 LAB — IRON: Iron: 138 ug/dL (ref 45–160)

## 2020-07-21 LAB — VITAMIN B1: Vitamin B1 (Thiamine): 13 nmol/L (ref 8–30)

## 2020-07-21 NOTE — Telephone Encounter (Signed)
I would really rather her wear a mask because it does provide better protection, especially with history of asthma.

## 2020-07-22 ENCOUNTER — Telehealth: Payer: Self-pay | Admitting: Pulmonary Disease

## 2020-07-22 MED ORDER — ALBUTEROL SULFATE HFA 108 (90 BASE) MCG/ACT IN AERS
1.0000 | INHALATION_SPRAY | Freq: Four times a day (QID) | RESPIRATORY_TRACT | 2 refills | Status: DC | PRN
Start: 2020-07-22 — End: 2020-09-27

## 2020-07-22 NOTE — Telephone Encounter (Signed)
BI, pt would like to change providers.  She would feel more comfortable with a female provider.  Thanks

## 2020-07-22 NOTE — Telephone Encounter (Signed)
Patient informed of the message below.  Patient stated she would like to have labs due to having green stools and I offered a virtual appt with another provider as PCP does not have any openings.  Patient declined and stated she will call back at another time.

## 2020-07-22 NOTE — Telephone Encounter (Signed)
Sure, I am happy for her to see Dr. Celine Mans or Dr. Everardo All  Thanks  Josephine Igo, DO [facility] 07/22/2020 5:20 PM

## 2020-07-22 NOTE — Telephone Encounter (Signed)
Refill has been sent to the pharmacy.  

## 2020-07-22 NOTE — Telephone Encounter (Signed)
Pt is calling back about a refill on her albuterol (VENTOLIN HFA) 108 MCG and I spoke with Randa Evens and she stated that the pt needs to go to call pulmonary to get that refill.  Pt insisted on speak with the person that she spoke with earlier b/c they stated that she had refill at the pharmacy.

## 2020-07-23 NOTE — Telephone Encounter (Signed)
Dr. Celine Mans has next available 30-min slot on 12/16. Dr. Celine Mans please advise if you're ok with taking on this patient.  Thanks!

## 2020-07-23 NOTE — Telephone Encounter (Signed)
Sure no problem. Ok to schedule.

## 2020-07-23 NOTE — Telephone Encounter (Signed)
Spoke with the pt and scheduled her for 08/19/20 at 1:30 pm with Dr Celine Mans. Nothing further needed.

## 2020-08-10 ENCOUNTER — Other Ambulatory Visit: Payer: Self-pay

## 2020-08-10 ENCOUNTER — Ambulatory Visit (INDEPENDENT_AMBULATORY_CARE_PROVIDER_SITE_OTHER): Admitting: Family Medicine

## 2020-08-10 ENCOUNTER — Encounter: Payer: Self-pay | Admitting: Family Medicine

## 2020-08-10 VITALS — BP 124/84 | HR 84 | Temp 98.6°F | Ht 71.0 in | Wt 153.0 lb

## 2020-08-10 DIAGNOSIS — R195 Other fecal abnormalities: Secondary | ICD-10-CM

## 2020-08-10 DIAGNOSIS — F1010 Alcohol abuse, uncomplicated: Secondary | ICD-10-CM | POA: Diagnosis not present

## 2020-08-10 NOTE — Progress Notes (Signed)
   Subjective:    Patient ID: Kelsey Poole, female    DOB: May 16, 1961, 59 y.o.   MRN: [id number]  HPI Here for the sudden onset of loose bright green stools about 2 months ago. No recent travelling or infections that she knows about. She averages 1 or 2 of these stools a day. No particular odor associated with them. No dark stools. No abdominal pain or fever or nausea. Appetite is good and weight is stable. Her only medication is Lisinopril and the only thing she takes OTC is thiamine. 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 She drinks a half gallon of liquor every 2-3 days. No change in her urine. No jaundice that she recalls. She has never had a colonoscopy.    Review of Systems  Constitutional: Negative.   Respiratory: Negative.   Cardiovascular: Negative.   Gastrointestinal: Negative for abdominal distention, abdominal pain, anal bleeding, blood in stool, constipation, nausea and vomiting.  Endocrine: Negative.   Genitourinary: Negative.        Objective:   Physical Exam Constitutional:      Appearance: Normal appearance. She is not ill-appearing.  Eyes:     General: No scleral icterus. Cardiovascular:     Rate and Rhythm: Normal rate and regular rhythm.     Pulses: Normal pulses.     Heart sounds: Normal heart sounds.  Pulmonary:     Effort: Pulmonary effort is normal.     Breath sounds: Normal breath sounds.  Abdominal:     General: Abdomen is flat. Bowel sounds are normal. There is no distension.     Palpations: Abdomen is soft. There is no mass.     Tenderness: There is no abdominal tenderness. There is no guarding or rebound.     Hernia: No hernia is present.  Lymphadenopathy:     Cervical: No cervical adenopathy.  Neurological:     Mental Status: She is alert.           Assessment & Plan:  Sudden change in consistency and color of the stools 2 months ago. We will get labs today to include liver enzymes and a stool culture. Get an  abdominal US soon. We discussed her alcohol abuse and she said she has no intention of cutting back at this point. She will follow up with her PCP, Dr. Hassan Rowan.  Gershon Crane, MD

## 2020-08-10 NOTE — Addendum Note (Signed)
Addended by: Lerry Liner on: 08/10/2020 03:59 PM   Modules accepted: Orders

## 2020-08-11 LAB — CBC WITH DIFFERENTIAL/PLATELET
Absolute Monocytes: 291 cells/uL (ref 200–950)
Basophils Absolute: 29 cells/uL (ref 0–200)
Basophils Relative: 0.5 %
Eosinophils Absolute: 160 cells/uL (ref 15–500)
Eosinophils Relative: 2.8 %
HCT: 29.9 % — ABNORMAL LOW (ref 35.0–45.0)
Hemoglobin: 10.6 g/dL — ABNORMAL LOW (ref 11.7–15.5)
Lymphs Abs: 2257 cells/uL (ref 850–3900)
MCH: 36.1 pg — ABNORMAL HIGH (ref 27.0–33.0)
MCHC: 35.5 g/dL (ref 32.0–36.0)
MCV: 101.7 fL — ABNORMAL HIGH (ref 80.0–100.0)
MPV: 10.5 fL (ref 7.5–12.5)
Monocytes Relative: 5.1 %
Neutro Abs: 2964 cells/uL (ref 1500–7800)
Neutrophils Relative %: 52 %
Platelets: 253 10*3/uL (ref 140–400)
RBC: 2.94 10*6/uL — ABNORMAL LOW (ref 3.80–5.10)
RDW: 12.9 % (ref 11.0–15.0)
Total Lymphocyte: 39.6 %
WBC: 5.7 10*3/uL (ref 3.8–10.8)

## 2020-08-11 LAB — BASIC METABOLIC PANEL WITH GFR
BUN/Creatinine Ratio: 12 (calc) (ref 6–22)
BUN: 17 mg/dL (ref 7–25)
CO2: 20 mmol/L (ref 20–32)
Calcium: 8.9 mg/dL (ref 8.6–10.4)
Chloride: 110 mmol/L (ref 98–110)
Creat: 1.44 mg/dL — ABNORMAL HIGH (ref 0.50–1.05)
GFR, Est African American: 46 mL/min/{1.73_m2} — ABNORMAL LOW (ref >=60)
GFR, Est Non African American: 40 mL/min/{1.73_m2} — ABNORMAL LOW (ref >=60)
Glucose, Bld: 75 mg/dL (ref 65–99)
Potassium: 4 mmol/L (ref 3.5–5.3)
Sodium: 141 mmol/L (ref 135–146)

## 2020-08-11 LAB — HEPATIC FUNCTION PANEL
AG Ratio: 2 (calc) (ref 1.0–2.5)
ALT: 11 U/L (ref 6–29)
AST: 27 U/L (ref 10–35)
Albumin: 4.1 g/dL (ref 3.6–5.1)
Alkaline phosphatase (APISO): 82 U/L (ref 37–153)
Bilirubin, Direct: 0.1 mg/dL (ref 0.0–0.2)
Globulin: 2.1 g/dL (calc) (ref 1.9–3.7)
Indirect Bilirubin: 0.4 mg/dL (calc) (ref 0.2–1.2)
Total Bilirubin: 0.5 mg/dL (ref 0.2–1.2)
Total Protein: 6.2 g/dL (ref 6.1–8.1)

## 2020-08-11 LAB — AMMONIA: Ammonia: 86 umol/L — ABNORMAL HIGH (ref <=72)

## 2020-08-14 LAB — SALMONELLA/SHIGELLA CULT, CAMPY EIA AND SHIGA TOXIN RFL ECOLI
MICRO NUMBER: 11288021
MICRO NUMBER:: 11288022
MICRO NUMBER:: 11288023
Result:: NOT DETECTED
SHIGA RESULT:: NOT DETECTED
SPECIMEN QUALITY: ADEQUATE
SPECIMEN QUALITY:: ADEQUATE
SPECIMEN QUALITY:: ADEQUATE

## 2020-08-19 ENCOUNTER — Other Ambulatory Visit: Payer: Self-pay

## 2020-08-19 ENCOUNTER — Ambulatory Visit (INDEPENDENT_AMBULATORY_CARE_PROVIDER_SITE_OTHER): Admitting: Internal Medicine

## 2020-08-19 ENCOUNTER — Encounter: Payer: Self-pay | Admitting: Internal Medicine

## 2020-08-19 VITALS — BP 122/76 | HR 94 | Temp 98.0°F | Ht 71.0 in | Wt 155.8 lb

## 2020-08-19 DIAGNOSIS — J410 Simple chronic bronchitis: Secondary | ICD-10-CM

## 2020-08-19 DIAGNOSIS — F17210 Nicotine dependence, cigarettes, uncomplicated: Secondary | ICD-10-CM

## 2020-08-19 DIAGNOSIS — F17200 Nicotine dependence, unspecified, uncomplicated: Secondary | ICD-10-CM

## 2020-08-19 NOTE — Patient Instructions (Addendum)
The patient should have follow up scheduled with myself in 1 year.    What are the benefits of quitting smoking? Quitting smoking can lower your chances of getting or dying from heart disease, lung disease, kidney failure, infection, or cancer. It can also lower your chances of getting osteoporosis, a condition that makes your bones weak. Plus, quitting smoking can help your skin look younger and reduce the chances that you will have problems with sex.  Quitting smoking will improve your health no matter how old you are, and no matter how long or how much you have smoked.  What should I do if I want to quit smoking? The letters in the word "START" can help you remember the steps to take: S = Set a quit date. T = Tell family, friends, and the people around you that you plan to quit. A = Anticipate or plan ahead for the tough times you'll face while quitting. R = Remove cigarettes and other tobacco products from your home, car, and work. T = Talk to your doctor about getting help to quit.  How can my doctor or nurse help? Your doctor or nurse can give you advice on the best way to quit. He or she can also put you in touch with counselors or other people you can call for support. Plus, your doctor or nurse can give you medicines to: ?Reduce your craving for cigarettes ?Reduce the unpleasant symptoms that happen when you stop smoking (called "withdrawal symptoms"). You can also get help from a free phone line (1-800-QUIT-NOW) or go online to [employer].  What are the symptoms of withdrawal? The symptoms include: ?Trouble sleeping ?Being irritable, anxious or restless ?Getting frustrated or angry ?Having trouble thinking clearly  Some people who stop smoking become temporarily depressed. Some people need treatment for depression, such as counseling or antidepressant medicines. Depressed people might: ?No longer enjoy or care about doing the things they used to like to do ?Feel sad,  down, hopeless, nervous, or cranky most of the day, almost every day ?Lose or gain weight ?Sleep too much or too little ?Feel tired or like they have no energy ?Feel guilty or like they are worth nothing ?Forget things or feel confused ?Move and speak more slowly than usual ?Act restless or have trouble staying still ?Think about death or suicide  If you think you might be depressed, see your doctor or nurse. Only someone trained in mental health can tell for sure if you are depressed. If you ever feel like you might hurt yourself, go straight to the nearest emergency department. Or you can call for an ambulance (in the [place] and [place], dial 9-1-1) or call your doctor or nurse right away and tell them it is an emergency. You can also reach the [place] National Suicide Prevention Lifeline at 712 794 9703 or http://hill.com/.  How do medicines help you stop smoking? Different medicines work in different ways: ?Nicotine replacement therapy eases withdrawal and reduces your body's craving for nicotine, the main drug found in cigarettes. There are different forms of nicotine replacement, including skin patches, lozenges, gum, nasal sprays, and "puffers" or inhalers. Many can be bought without a prescription, while others might require one. ?Bupropion is a prescription medicine that reduces your desire to smoke. This medicine is sold under the brand names Zyban and Wellbutrin. It is also available in a generic version, which is cheaper than brand name medicines. ?Varenicline (brand names: Chantix, Champix) is a prescription medicine that reduces withdrawal symptoms and  cigarette cravings. If you think you'd like to take varenicline and you have a history of depression, anxiety, or heart disease, discuss this with your doctor or nurse before taking the medicine. Varenicline can also increase the effects of alcohol in some people. It's a good idea to limit drinking while you're taking it, at  least until you know how it affects you.  How does counseling work? Counseling can happen during formal office visits or just over the phone. A counselor can help you: ?Figure out what triggers your smoking and what to do instead ?Overcome cravings ?Figure out what went wrong when you tried to quit before  What works best? Studies show that people have the best luck at quitting if they take medicines to help them quit and work with a Veterinary surgeon. It might also be helpful to combine nicotine replacement with one of the prescription medicines that help people quit. In some cases, it might even make sense to take bupropion and varenicline together.  What about e-cigarettes? Sometimes people wonder if using electronic cigarettes, or "e-cigarettes," might help them quit smoking. Using e-cigarettes is also called "vaping." Doctors do not recommend e-cigarettes in place of medicines and counseling. That's because e-cigarettes still contain nicotine as well as other substances that might be harmful. It's not clear how they can affect a person's health in the long term.  Will I gain weight if I quit? Yes, you might gain a few pounds. But quitting smoking will have a much more positive effect on your health than weighing a few pounds more. Plus, you can help prevent some weight gain by being more active and eating less. Taking the medicine bupropion might help control weight gain.   What else can I do to improve my chances of quitting? You can: ?Start exercising. ?Stay away from smokers and places that you associate with smoking. If people close to you smoke, ask them to quit with you. ?Keep gum, hard candy, or something to put in your mouth handy. If you get a craving for a cigarette, try one of these instead. ?Don't give up, even if you start smoking again. It takes most people a few tries before they succeed.  What if I am pregnant and I smoke? If you are pregnant, it's really important for the  health of your baby that you quit. Ask your doctor what options you have, and what is safest for your baby

## 2020-08-19 NOTE — Progress Notes (Signed)
Kelsey Poole    [id number]    Jan 28, 1961  Primary Care Physician:Koberlein, Paris Lore, MD Date of Appointment: 08/19/2020 Established Patient Visit  Chief complaint:   Chief Complaint  Patient presents with  . Follow-up    Coughing, shortness of breath, takes BP as she sees fit, wheezing, using albuterol inhaler 1 time every 2-3 days.      HPI: Kelsey Poole is a 59 y.o. woman with history of emphysema previously established with Dr. Tonia Brooms who is seeing me today for new patient visit.   Interval Updates: Takes albuterol 1-2 times/week. No hospitalizations.   Current 1/2 ppd smoker. Coughs and occasional brings up mucus, no blood.  No hospitalizations for breathing.   Drinks a pint of liquor a day. Works as a Conservation officer, nature. Currently not employed because she doesn't want to wear a mask. She was fired. Says she isn't allowed to see her grandchildren right now due to drinking and smoking. Denies history of withdrawals.   I have reviewed the patient's family social and past medical history and updated as appropriate.   Past Medical History:  Diagnosis Date  . Asthma   . Carpal tunnel syndrome   . Hypertension     Past Surgical History:  Procedure Laterality Date  . CESAREAN SECTION      Family History  Problem Relation Age of Onset  . Diabetes Mother   . Hypertension Mother   . Asthma Father   . Hyperlipidemia Father     Social History   Occupational History  . Not on file  Tobacco Use  . Smoking status: Current Every Day Smoker    Packs/day: 0.75    Years: 40.00    Pack years: 30.00    Types: Cigarettes  . Smokeless tobacco: Never Used  . Tobacco comment: 08/19/20 9-10 cigs a day   Vaping Use  . Vaping Use: Never used  Substance and Sexual Activity  . Alcohol use: Yes    Comment: states "too much"  . Drug use: Yes    Types: Cocaine, Marijuana    Comment: marijuana the other day  . Sexual activity: Yes     Physical Exam: Blood  pressure 122/76, pulse 94, temperature 98 F (36.7 C), temperature source Temporal, height 5\' 11"  (1.803 m), weight 155 lb 12.8 oz (70.7 kg), SpO2 100 %.  Gen:      No acute distress ENT:  no nasal polyps, mucus membranes moist Lungs:    No increased respiratory effort, symmetric chest wall excursion, clear to auscultation bilaterally, no wheezes or crackles CV:         Regular rate and rhythm; no murmurs, rubs, or gallops.  No pedal edema   Data Reviewed: Imaging: I have personally reviewed the CT Chest sept 2020 which shows mild centrilobular emphysema  PFTs: No PFTs on file  Labs:  Immunization status: Immunization History  Administered Date(s) Administered  . PFIZER SARS-COV-2 Vaccination 05/20/2020, 06/15/2020  . Tdap 05/28/2020    Assessment:  Centrilobular Emphysema Tobacco Use Disorder Alcohol Use Disorder  Plan/Recommendations: Continue prn albuterol. She will let me know if she needs refills.   I personally spent 4 minutes counseling the patient regarding tobacco use disorder.  Patient is symptomatic from tobacco use disorder due to the following condition: copd.  The patient's response was contemplative.  We discussed nicotine replacement therapy, Wellbutrin.  We identified to gather patient specific barriers to change.  The patient is open to future  discussions about tobacco cessation.  Will have her follow up with Kandice Robinsons for CT scan.   Return to Care: Return in about 1 year (around 08/19/2021).   Durel Salts, MD Pulmonary and Critical Care Medicine [facility] Office:[phone]

## 2020-08-20 ENCOUNTER — Telehealth: Payer: Self-pay | Admitting: Family Medicine

## 2020-08-20 DIAGNOSIS — R195 Other fecal abnormalities: Secondary | ICD-10-CM

## 2020-08-20 NOTE — Telephone Encounter (Signed)
Patient called for results. Please advise °

## 2020-08-23 NOTE — Addendum Note (Signed)
Addended by: Johnella Moloney on: 08/23/2020 04:21 PM   Modules accepted: Orders

## 2020-08-23 NOTE — Telephone Encounter (Signed)
Patient informed of the test results and message below per PCP.  Patient stated she is concerned with her stools still being gree

## 2020-08-23 NOTE — Telephone Encounter (Signed)
See Dr. Claris Che result note; he last saw her and did labs and stool studies on her.  I am okay with referral to gastroenterology if her symptoms are persisting.  If she has had any worsening of symptoms since she saw him last, please let me know.  In case he did not tell her, I would suggest that because of loose stools she avoid all dairy as this will exacerbate her symptoms.

## 2020-08-24 ENCOUNTER — Telehealth: Payer: Self-pay

## 2020-08-24 NOTE — Telephone Encounter (Signed)
Left message for patient to call back to discuss results and recommendations. °

## 2020-08-24 NOTE — Telephone Encounter (Signed)
-----   Message from Nelwyn Salisbury, MD sent at 08/18/2020  5:36 PM EST ----- The labs are normal except for some mild renal insufficiency (which is stable) and she is anemic.. her Hgb has been slowly dropping the past few years. No evidence of infection in the stools. She may be having some GI blood losses (she is at risk of esophageal varices from alcohol use, ulcers, etc). She may need upper and lower endoscopy. She should follow up with Dr. Hassan Rowan asap for these issues

## 2020-09-27 ENCOUNTER — Encounter: Payer: Self-pay | Admitting: Family Medicine

## 2020-09-27 ENCOUNTER — Emergency Department (HOSPITAL_COMMUNITY)
Admission: EM | Admit: 2020-09-27 | Discharge: 2020-09-28 | Disposition: A | Discharge status: Home / Self Care | Attending: Emergency Medicine | Admitting: Emergency Medicine

## 2020-09-27 ENCOUNTER — Telehealth (INDEPENDENT_AMBULATORY_CARE_PROVIDER_SITE_OTHER): Admitting: Family Medicine

## 2020-09-27 ENCOUNTER — Other Ambulatory Visit: Payer: Self-pay

## 2020-09-27 DIAGNOSIS — F17210 Nicotine dependence, cigarettes, uncomplicated: Secondary | ICD-10-CM | POA: Insufficient documentation

## 2020-09-27 DIAGNOSIS — F1410 Cocaine abuse, uncomplicated: Secondary | ICD-10-CM | POA: Insufficient documentation

## 2020-09-27 DIAGNOSIS — Z79899 Other long term (current) drug therapy: Secondary | ICD-10-CM | POA: Diagnosis not present

## 2020-09-27 DIAGNOSIS — R000 Tachycardia, unspecified: Secondary | ICD-10-CM | POA: Diagnosis not present

## 2020-09-27 DIAGNOSIS — R45850 Homicidal ideations: Secondary | ICD-10-CM | POA: Insufficient documentation

## 2020-09-27 DIAGNOSIS — J45909 Unspecified asthma, uncomplicated: Secondary | ICD-10-CM | POA: Insufficient documentation

## 2020-09-27 DIAGNOSIS — F1010 Alcohol abuse, uncomplicated: Secondary | ICD-10-CM | POA: Insufficient documentation

## 2020-09-27 DIAGNOSIS — U071 COVID-19: Secondary | ICD-10-CM | POA: Insufficient documentation

## 2020-09-27 DIAGNOSIS — I10 Essential (primary) hypertension: Secondary | ICD-10-CM | POA: Insufficient documentation

## 2020-09-27 DIAGNOSIS — R4182 Altered mental status, unspecified: Secondary | ICD-10-CM | POA: Diagnosis present

## 2020-09-27 DIAGNOSIS — F332 Major depressive disorder, recurrent severe without psychotic features: Secondary | ICD-10-CM | POA: Diagnosis not present

## 2020-09-27 LAB — CBC WITH DIFFERENTIAL/PLATELET
Abs Immature Granulocytes: 0.02 10*3/uL (ref 0.00–0.07)
Basophils Absolute: 0 10*3/uL (ref 0.0–0.1)
Basophils Relative: 1 %
Eosinophils Absolute: 0.1 10*3/uL (ref 0.0–0.5)
Eosinophils Relative: 1 %
HCT: 32.6 % — ABNORMAL LOW (ref 36.0–46.0)
Hemoglobin: 11.4 g/dL — ABNORMAL LOW (ref 12.0–15.0)
Immature Granulocytes: 0 %
Lymphocytes Relative: 48 %
Lymphs Abs: 3.3 10*3/uL (ref 0.7–4.0)
MCH: 35.6 pg — ABNORMAL HIGH (ref 26.0–34.0)
MCHC: 35 g/dL (ref 30.0–36.0)
MCV: 101.9 fL — ABNORMAL HIGH (ref 80.0–100.0)
Monocytes Absolute: 0.4 10*3/uL (ref 0.1–1.0)
Monocytes Relative: 6 %
Neutro Abs: 2.9 10*3/uL (ref 1.7–7.7)
Neutrophils Relative %: 44 %
Platelets: 391 10*3/uL (ref 150–400)
RBC: 3.2 MIL/uL — ABNORMAL LOW (ref 3.87–5.11)
RDW: 12.9 % (ref 11.5–15.5)
WBC: 6.7 10*3/uL (ref 4.0–10.5)
nRBC: 0 % (ref 0.0–0.2)

## 2020-09-27 LAB — COMPREHENSIVE METABOLIC PANEL
ALT: 19 U/L (ref 0–44)
AST: 51 U/L — ABNORMAL HIGH (ref 15–41)
Albumin: 3.7 g/dL (ref 3.5–5.0)
Alkaline Phosphatase: 121 U/L (ref 38–126)
Anion gap: 15 (ref 5–15)
BUN: 15 mg/dL (ref 6–20)
CO2: 17 mmol/L — ABNORMAL LOW (ref 22–32)
Calcium: 8.8 mg/dL — ABNORMAL LOW (ref 8.9–10.3)
Chloride: 109 mmol/L (ref 98–111)
Creatinine, Ser: 1.75 mg/dL — ABNORMAL HIGH (ref 0.44–1.00)
GFR, Estimated: 33 mL/min — ABNORMAL LOW (ref >60)
Glucose, Bld: 96 mg/dL (ref 70–99)
Potassium: 3.7 mmol/L (ref 3.5–5.1)
Sodium: 141 mmol/L (ref 135–145)
Total Bilirubin: 0.9 mg/dL (ref 0.3–1.2)
Total Protein: 7.5 g/dL (ref 6.5–8.1)

## 2020-09-27 LAB — ETHANOL: Alcohol, Ethyl (B): 304 mg/dL (ref <10)

## 2020-09-27 MED ORDER — LORAZEPAM 2 MG/ML IJ SOLN
1.0000 mg | Freq: Once | INTRAMUSCULAR | Status: AC
  Administered 2020-09-27: 1 mg via INTRAMUSCULAR
  Filled 2020-09-27: qty 1

## 2020-09-27 MED ORDER — AMOXICILLIN-POT CLAVULANATE 875-125 MG PO TABS: 1.0000 | ORAL_TABLET | Freq: Two times a day (BID) | ORAL | Status: DC

## 2020-09-27 MED ORDER — LISINOPRIL 10 MG PO TABS: 5.0000 mg | ORAL_TABLET | Freq: Every day | ORAL | Status: DC

## 2020-09-27 MED ORDER — STERILE WATER FOR INJECTION IJ SOLN
INTRAMUSCULAR | Status: AC
  Filled 2020-09-27: qty 10

## 2020-09-27 MED ORDER — AMOXICILLIN-POT CLAVULANATE 875-125 MG PO TABS: 1.0000 | ORAL_TABLET | Freq: Two times a day (BID) | ORAL | 0 refills | Status: DC

## 2020-09-27 MED ORDER — ZIPRASIDONE MESYLATE 20 MG IM SOLR
10.0000 mg | Freq: Once | INTRAMUSCULAR | Status: AC
  Administered 2020-09-27: 10 mg via INTRAMUSCULAR
  Filled 2020-09-27: qty 20

## 2020-09-27 NOTE — ED Notes (Addendum)
While attempting to medicate patient. Patient kicking, screaming and throwing water at staff. Security and [employer] at bedside.
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
## 2020-09-27 NOTE — Progress Notes (Signed)
   Subjective:    Patient ID: Kelsey Poole, female    DOB: 11/02/1960, 60 y.o.   MRN: [id number]  HPI Virtual Visit via Telephone Note  I connected with the patient on 09/27/20 at  2:00 PM EST by telephone and verified that I am speaking with the correct person using two identifiers.   I discussed the limitations, risks, security and privacy concerns of performing an evaluation and management service by telephone and the availability of in person appointments. I also discussed with the patient that there may be a patient responsible charge related to this service. The patient expressed understanding and agreed to proceed.  Location patient: home Location provider: work or home office Participants present for the call: patient, provider Patient did not have a visit in the prior 7 days to address this/these issue(s).   History of Present Illness: Here for 2 weeks of a dry cough, sinus congestion, and blowing yellow mucus from the nose. No fever. No SOB or body aches or NVD. She and her friend both tested positive for the Covid-19 virus about 2 weeks ago.    Observations/Objective: Patient sounds cheerful and well on the phone. I do not appreciate any SOB. Speech and thought processing are grossly intact. Patient reported vitals:  Assessment and Plan: Covid infection, now with a secondary sinusitis. Treat with Augmentin. Recheck prn. Gershon Crane, MD   Follow Up Instructions:     [phone] 11-20 [id number] 21-30 I did not refer this patient for an OV in the next 24 hours for this/these issue(s).  I discussed the assessment and treatment plan with the patient. The patient was provided an opportunity to ask questions and all were answered. The patient agreed with the plan and demonstrated an understanding of the instructions.   The patient was advised to call back or seek an in-person evaluation if the symptoms worsen or if the condition fails to improve as anticipated.  I  provided 14 minutes of non-face-to-face time during this encounter.   Gershon Crane, MD    Review of Systems     Objective:   Physical Exam        Assessment & Plan:

## 2020-09-27 NOTE — ED Notes (Signed)
Patient spit at staff while medicating patient. Security at bedside.
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
## 2020-09-27 NOTE — ED Notes (Signed)
Patient refusing to be cooperative and yelling at staff. Patient not redirectable at this time.

## 2020-09-28 LAB — RAPID URINE DRUG SCREEN, HOSP PERFORMED
Amphetamines: NOT DETECTED
Barbiturates: NOT DETECTED
Benzodiazepines: NOT DETECTED
Cocaine: POSITIVE — AB
Opiates: NOT DETECTED
Tetrahydrocannabinol: NOT DETECTED

## 2020-09-28 LAB — SARS CORONAVIRUS 2 BY RT PCR (HOSPITAL ORDER, PERFORMED IN ~~LOC~~ HOSPITAL LAB): SARS Coronavirus 2: POSITIVE — AB

## 2020-09-28 NOTE — ED Notes (Signed)
Pt belongings are in locker 4 (4 bags)

## 2020-09-28 NOTE — ED Notes (Signed)
Pt d/c by MD and is provided with d/c instructions and follow up care and provided with resources, Pt is out of the ED ambulatory and is provided with an bus pass

## 2020-09-28 NOTE — ED Notes (Signed)
Pt sleeping. Chest rise and fall noted. Will continue to monitor.

## 2020-09-28 NOTE — Discharge Instructions (Signed)
You have been seen and discharged from the emergency department.  Follow-up with your primary provider for reevaluation. Take home medications as prescribed. If you have any worsening symptoms or further concerns for health please return to an emergency department for further evaluation.  If you have any thoughts of hurting yourself or others please utilize the resources listed in this packet or return immediately to emergency department if you have suicidal or homicidal thoughts.  We are here to help..  You may call the suicide prevention Hotline at (414)772-6109.
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
## 2020-09-28 NOTE — ED Provider Notes (Addendum)
Emergency Medicine Observation Re-evaluation Note  Kelsey Poole is a 60 y.o. female, seen on rounds today.  Pt initially presented to the ED for complaints of IVC Currently, the patient is calm, alert, eating breakfast. States is feeling better this AM, and feels ready to go home.  TTS eval pending.   Physical Exam  BP (!) 171/148 (BP Location: Left Arm) Comment: Pt states that she takes her medicine "as she sees fit".  Pulse 95   Temp 97.9 F (36.6 C) (Oral)   Resp 20   SpO2 97%  Physical Exam General: awake, content, eating.  Cardiac: regular rate Lungs: breathing comfortably Psych: calm, cooperative. Currently denies SI/HI - states when intoxication/using substances was angry and frustrated, but that she has no thoughts of, or intent to, harm others or herself.   ED Course / MDM  EKG:    I have reviewed the labs performed to date as well as medications administered while in observation.  Recent changes in the last 24 hours include placement of TTS consult, observation/de-escalation in ED.   Plan  Patient reports feeling improved - indicates when under influence etoh/drugs was feeling angry and frustrated, but feels better today.  Denies thoughts of harm to self or others.   Current plan is for [facility] consultation. Disposition per [facility] team.   Covid test is positive. Pt denies cough, fever or sob.        Cathren Laine, MD 09/28/20 [phone]

## 2020-10-14 ENCOUNTER — Encounter: Payer: Self-pay | Admitting: Gastroenterology

## 2020-10-19 ENCOUNTER — Ambulatory Visit: Admitting: Gastroenterology

## 2020-10-29 ENCOUNTER — Telehealth: Payer: Self-pay | Admitting: Pulmonary Disease

## 2020-11-01 NOTE — Telephone Encounter (Signed)
LMTC x 1 - Leave in my box to follow up on 

## 2020-11-04 NOTE — Telephone Encounter (Signed)
Spoke with pt and advised that we will repeat lung screening CT through the grant at [facility]. Pt aware that I will mail her the ticket to get CT done at [facility]. Nothing further needed at this time.

## 2020-11-08 ENCOUNTER — Other Ambulatory Visit: Payer: Self-pay | Admitting: *Deleted

## 2020-11-08 DIAGNOSIS — F17200 Nicotine dependence, unspecified, uncomplicated: Secondary | ICD-10-CM

## 2020-11-18 ENCOUNTER — Encounter: Payer: Self-pay | Admitting: Gastroenterology

## 2020-11-18 ENCOUNTER — Ambulatory Visit (INDEPENDENT_AMBULATORY_CARE_PROVIDER_SITE_OTHER): Admitting: Gastroenterology

## 2020-11-18 VITALS — BP 140/90 | HR 100 | Ht 70.0 in | Wt 156.1 lb

## 2020-11-18 DIAGNOSIS — R1032 Left lower quadrant pain: Secondary | ICD-10-CM

## 2020-11-18 DIAGNOSIS — D638 Anemia in other chronic diseases classified elsewhere: Secondary | ICD-10-CM | POA: Diagnosis not present

## 2020-11-18 DIAGNOSIS — Z1211 Encounter for screening for malignant neoplasm of colon: Secondary | ICD-10-CM

## 2020-11-18 DIAGNOSIS — R194 Change in bowel habit: Secondary | ICD-10-CM

## 2020-11-18 NOTE — Patient Instructions (Addendum)
  You have been scheduled for an endoscopy and colonoscopy. Please follow the written instructions given to you at your visit today. Please pick up your prep supplies at the pharmacy within the next 1-3 days. If you use inhalers (even only as needed), please bring them with you on the day of your procedure.   We are giving you a Plenvu prep kit today  Due to recent changes in healthcare laws, you may see the results of your imaging and laboratory studies on MyChart before your provider has had a chance to review them.  We understand that in some cases there may be results that are confusing or concerning to you. Not all laboratory results come back in the same time frame and the provider may be waiting for multiple results in order to interpret others.  Please give [place] 48 hours in order for your provider to thoroughly review all the results before contacting the office for clarification of your results.   If you are age 61 or older, your body mass index should be between 23-30. Your Body mass index is 22.4 kg/m. If this is out of the aforementioned range listed, please consider follow up with your Primary Care Provider.  If you are age 33 or younger, your body mass index should be between 19-25. Your Body mass index is 22.4 kg/m. If this is out of the aformentioned range listed, please consider follow up with your Primary Care Provider.    I appreciate the  opportunity to care for you  Thank You   Harl Bowie , MD

## 2020-11-18 NOTE — Progress Notes (Signed)
Kelsey Poole    [id number]    09/23/1960  Primary Care Physician:Koberlein, Paris Lore, MD    Referring Physician: Wynn Banker, MD [address] [place],  [place] [address]   Chief complaint: Change in bowel habits, left lower quadrant abdominal pain HPI:  60 year old female here for new patient visit with complaints of change in bowel habits and left lower quadrant abdominal pain She noticed change in her stool color to bright green about 2 months ago.  Her stool color has returned to normal and she has mild left lower quadrant discomfort but does not have severe pain.  She feels she had pain due to excess straining at work, she was using a heavy mop that was over 5 pounds and that may have caused her to have the pain.   She describes herself as being functional alcoholic, drinks a pint of liquor per day.  Denies any recent cocaine use. No melena or rectal bleeding. She never had EGD or colonoscopy.  Reviewed recent labs in epic from September 27, 2020 Normal bilirubin.  Elevated AST with high AST to ALT ratio consistent with daily alcohol use Anemia with high MCV B12 352 and folate 7.7 Elevated creatinine 1.7 Ammonia elevated at 86 Urine tox screen positive for cocaine and serum alcohol level elevated at 304  Hepatitis C antibody negative  CT abdomen pelvis with contrast August 16, 2016 Avascular necrosis of bilateral femoral heads, arctic atherosclerosis otherwise unremarkable exam with no acute pathology.   Relevant medical history includes COPD/emphysema, current half pack per day smoker, drinks about a pint of liquor every day  Outpatient Encounter Medications as of 11/18/2020  Medication Sig  . ALBUTEROL IN Inhale 1-2 puffs into the lungs as needed.  Marland Kitchen lisinopril (ZESTRIL) 5 MG tablet Take 1 tablet (5 mg total) by mouth daily. (Patient taking differently: Take 5 mg by mouth as needed (blood pressure). Patient takes as she feels depending on  her BP)  . [DISCONTINUED] amoxicillin-clavulanate (AUGMENTIN) 875-125 MG tablet Take 1 tablet by mouth 2 (two) times daily.  . [DISCONTINUED] pantoprazole (PROTONIX) 20 MG tablet Take 2 tablets (40 mg total) by mouth daily. (Patient not taking: Reported on 10/17/2018)   No facility-administered encounter medications on file as of 11/18/2020.    Allergies as of 11/18/2020 - Review Complete 11/18/2020  Allergen Reaction Noted  . Percocet [oxycodone-acetaminophen] Itching 10/04/2014  . Azithromycin Hives 09/12/2012    Past Medical History:  Diagnosis Date  . Alcoholism (HCC)   . Asthma   . Carpal tunnel syndrome   . Hypertension     Past Surgical History:  Procedure Laterality Date  . CESAREAN SECTION  06/06/1992  . INDUCED ABORTION      Family History  Problem Relation Age of Onset  . Diabetes Mother   . Hypertension Mother   . Asthma Father   . Hyperlipidemia Father     Social History   Socioeconomic History  . Marital status: Widowed    Spouse name: Not on file  . Number of children: 2  . Years of education: Not on file  . Highest education level: 12th grade  Occupational History  . Occupation: Temp work  Tobacco Use  . Smoking status: Current Every Day Smoker    Packs/day: 0.75    Years: 40.00    Pack years: 30.00    Types: Cigarettes  . Smokeless tobacco: Never Used  . Tobacco comment: 08/19/20 9-10 cigs  a day   Vaping Use  . Vaping Use: Never used  Substance and Sexual Activity  . Alcohol use: Yes    Comment: states "too much"  . Drug use: Yes    Types: Cocaine, Marijuana    Comment: marijuana the other day  . Sexual activity: Yes  Other Topics Concern  . Not on file  Social History Narrative  . Not on file   Social Determinants of Health   Financial Resource Strain: Not on file  Food Insecurity: Not on file  Transportation Needs: Not on file  Physical Activity: Not on file  Stress: Not on file  Social Connections: Not on file  Intimate  Partner Violence: Not on file      Review of systems: All other review of systems negative except as mentioned in the HPI.   Physical Exam: Vitals:   11/18/20 1028  BP: 140/90  Pulse: 100   Body mass index is 22.4 kg/m. 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 She does not have any signs of cirrhosis based on exam or labs Mild chronic anemia We will plan to proceed with EGD and colonoscopy for further evaluation of anemia, exclude erosive esophagitis, gastroduodenitis, peptic ulcer disease or neoplasia The risks and benefits as well as alternatives of endoscopic procedure(s) have been discussed and reviewed. All questions answered. The patient agrees to proceed.  Advised patient to avoid cocaine specially prior to the procedure, as it can life-threatening emergency with cardiovascular dysfunction when combined with anesthetic agents Discussed smoking cessation and alcohol cessation  Return after EGD and colonoscopy based on findings  The patient was provided an opportunity to ask questions and all were answered. The patient agreed with the plan and demonstrated an understanding of the instructions.  Iona Beard , MD    CC: Wynn Banker, MD

## 2020-11-23 ENCOUNTER — Ambulatory Visit
Admission: RE | Admit: 2020-11-23 | Discharge: 2020-11-23 | Disposition: A | Discharge status: Home / Self Care | Payer: No Typology Code available for payment source | Source: Ambulatory Visit | Attending: Acute Care | Admitting: Acute Care

## 2020-11-23 DIAGNOSIS — F17200 Nicotine dependence, unspecified, uncomplicated: Secondary | ICD-10-CM

## 2020-11-30 ENCOUNTER — Other Ambulatory Visit: Payer: Self-pay

## 2020-11-30 ENCOUNTER — Ambulatory Visit (INDEPENDENT_AMBULATORY_CARE_PROVIDER_SITE_OTHER): Admitting: Family Medicine

## 2020-11-30 ENCOUNTER — Encounter: Payer: Self-pay | Admitting: Family Medicine

## 2020-11-30 VITALS — BP 136/98 | HR 86 | Temp 98.6°F | Wt 157.0 lb

## 2020-11-30 DIAGNOSIS — R1032 Left lower quadrant pain: Secondary | ICD-10-CM | POA: Diagnosis not present

## 2020-11-30 DIAGNOSIS — N1832 Chronic kidney disease, stage 3b: Secondary | ICD-10-CM | POA: Insufficient documentation

## 2020-11-30 DIAGNOSIS — N183 Chronic kidney disease, stage 3 unspecified: Secondary | ICD-10-CM | POA: Insufficient documentation

## 2020-11-30 LAB — POC URINALSYSI DIPSTICK (AUTOMATED)
Bilirubin, UA: NEGATIVE
Blood, UA: NEGATIVE
Glucose, UA: NEGATIVE
Ketones, UA: NEGATIVE
Leukocytes, UA: NEGATIVE
Nitrite, UA: NEGATIVE
Protein, UA: POSITIVE — AB
Spec Grav, UA: 1.015 (ref 1.010–1.025)
Urobilinogen, UA: 0.2 E.U./dL
pH, UA: 6 (ref 5.0–8.0)

## 2020-11-30 LAB — BASIC METABOLIC PANEL
BUN: 17 mg/dL (ref 6–23)
CO2: 21 mEq/L (ref 19–32)
Calcium: 9.2 mg/dL (ref 8.4–10.5)
Chloride: 105 mEq/L (ref 96–112)
Creatinine, Ser: 1.31 mg/dL — ABNORMAL HIGH (ref 0.40–1.20)
GFR: 44.51 mL/min — ABNORMAL LOW (ref >60.00)
Glucose, Bld: 84 mg/dL (ref 70–99)
Potassium: 4 mEq/L (ref 3.5–5.1)
Sodium: 138 mEq/L (ref 135–145)

## 2020-11-30 NOTE — Addendum Note (Signed)
Addended by: Evert Kohl D on: 11/30/2020 01:47 PM   Modules accepted: Orders

## 2020-11-30 NOTE — Progress Notes (Signed)
   Subjective:    Patient ID: Kelsey Poole, female    DOB: December 17, 1960, 60 y.o.   MRN: [id number]  HPI Here for constant pain in the LLQ of the abdomen that started 2 months ago. This waxes and wanes but never goes away. Often the pain is eased somewhat by passing a BM. He stools are normal. They were green in color a few weeks ago but they have returned to a normal brown color. No urinary symptoms. No fever or nausea. She saw Dr. Rusty Aus a few weeks ago, and she has scheduled upper and lower endoscopy for 02-02-21. Kelsey Poole is here because she does not want to wait that long to get some answers.    Review of Systems  Constitutional: Negative.   Respiratory: Negative.   Cardiovascular: Negative.   Gastrointestinal: Positive for abdominal pain. Negative for abdominal distention, anal bleeding, blood in stool, constipation, diarrhea, nausea, rectal pain and vomiting.  Genitourinary: Negative.        Objective:   Physical Exam Constitutional:      Appearance: Normal appearance.  Cardiovascular:     Rate and Rhythm: Normal rate and regular rhythm.     Pulses: Normal pulses.     Heart sounds: Normal heart sounds.  Pulmonary:     Effort: Pulmonary effort is normal.     Breath sounds: Normal breath sounds.  Abdominal:     General: Abdomen is flat. Bowel sounds are normal. There is no distension.     Palpations: Abdomen is soft. There is no mass.     Tenderness: There is no guarding or rebound.     Hernia: No hernia is present.     Comments: Mildly tender in the LLQ   Neurological:     Mental Status: She is alert.           Assessment & Plan:  Chronic low level LLQ pain. She is scheduled for endoscopy as above. We will set up a contrasted abdominal and pelvic CT scan soon.  Gershon Crane, MD

## 2020-12-06 ENCOUNTER — Telehealth: Payer: Self-pay | Admitting: Acute Care

## 2020-12-06 DIAGNOSIS — F17210 Nicotine dependence, cigarettes, uncomplicated: Secondary | ICD-10-CM

## 2020-12-06 NOTE — Telephone Encounter (Signed)
Pt informed of CT results per Sarah Groce, NP.  PT verbalized understanding.  Copy sent to PCP.  Order placed for 1 yr f/u CT.  

## 2020-12-10 ENCOUNTER — Other Ambulatory Visit

## 2020-12-21 NOTE — Progress Notes (Signed)
Results called to patient 4/4/ by Abigail Miyamoto RN. See telephone note  Lung RADS 1, negative study: no nodules or definitely benign nodules. Radiology recommendation is for a repeat LDCT in 12 months.   + CAD, please have her follow up with PCP

## 2021-01-06 ENCOUNTER — Telehealth: Payer: Self-pay | Admitting: Family Medicine

## 2021-01-06 NOTE — Telephone Encounter (Signed)
Certainly menopause can cause some metabolic changes; and could affect hunger. There are, however, a lot of things that can play a role. Would advise visit if noting significant changes in weight or changes in energy level or excessive thirst/urination.

## 2021-01-06 NOTE — Telephone Encounter (Signed)
Patient is calling and stated the has some concerns about her feeling hungry all the time and wanted to see if related to her going through menopause. Pt declined appointment, please advise. CB is [phone]

## 2021-01-07 NOTE — Telephone Encounter (Signed)
Patient informed of the message below.  I offered to schedule an appt, patient declined and stated she will call back.

## 2021-01-11 ENCOUNTER — Telehealth: Payer: Self-pay | Admitting: Family Medicine

## 2021-01-11 ENCOUNTER — Encounter: Payer: Self-pay | Admitting: Family Medicine

## 2021-01-11 ENCOUNTER — Other Ambulatory Visit: Payer: Self-pay

## 2021-01-11 ENCOUNTER — Ambulatory Visit (INDEPENDENT_AMBULATORY_CARE_PROVIDER_SITE_OTHER): Admitting: Family Medicine

## 2021-01-11 VITALS — BP 128/82 | HR 107 | Temp 98.1°F | Wt 155.0 lb

## 2021-01-11 DIAGNOSIS — R632 Polyphagia: Secondary | ICD-10-CM | POA: Diagnosis not present

## 2021-01-11 DIAGNOSIS — N1832 Chronic kidney disease, stage 3b: Secondary | ICD-10-CM | POA: Diagnosis not present

## 2021-01-11 LAB — CBC WITH DIFFERENTIAL/PLATELET
Basophils Absolute: 0 10*3/uL (ref 0.0–0.1)
Basophils Relative: 0.7 % (ref 0.0–3.0)
Eosinophils Absolute: 0.2 10*3/uL (ref 0.0–0.7)
Eosinophils Relative: 2.8 % (ref 0.0–5.0)
HCT: 34 % — ABNORMAL LOW (ref 36.0–46.0)
Hemoglobin: 11.7 g/dL — ABNORMAL LOW (ref 12.0–15.0)
Lymphocytes Relative: 32.5 % (ref 12.0–46.0)
Lymphs Abs: 1.9 10*3/uL (ref 0.7–4.0)
MCHC: 34.3 g/dL (ref 30.0–36.0)
MCV: 105.5 fl — ABNORMAL HIGH (ref 78.0–100.0)
Monocytes Absolute: 0.3 10*3/uL (ref 0.1–1.0)
Monocytes Relative: 5.1 % (ref 3.0–12.0)
Neutro Abs: 3.5 10*3/uL (ref 1.4–7.7)
Neutrophils Relative %: 58.9 % (ref 43.0–77.0)
Platelets: 274 10*3/uL (ref 150.0–400.0)
RBC: 3.22 Mil/uL — ABNORMAL LOW (ref 3.87–5.11)
RDW: 14.3 % (ref 11.5–15.5)
WBC: 5.9 10*3/uL (ref 4.0–10.5)

## 2021-01-11 LAB — BASIC METABOLIC PANEL
BUN: 20 mg/dL (ref 6–23)
CO2: 20 mEq/L (ref 19–32)
Calcium: 9.2 mg/dL (ref 8.4–10.5)
Chloride: 103 mEq/L (ref 96–112)
Creatinine, Ser: 1.8 mg/dL — ABNORMAL HIGH (ref 0.40–1.20)
GFR: 30.38 mL/min — ABNORMAL LOW (ref >60.00)
Glucose, Bld: 82 mg/dL (ref 70–99)
Potassium: 4 mEq/L (ref 3.5–5.1)
Sodium: 137 mEq/L (ref 135–145)

## 2021-01-11 LAB — HEPATIC FUNCTION PANEL
ALT: 20 U/L (ref 0–35)
AST: 63 U/L — ABNORMAL HIGH (ref 0–37)
Albumin: 4.3 g/dL (ref 3.5–5.2)
Alkaline Phosphatase: 100 U/L (ref 39–117)
Bilirubin, Direct: 0.1 mg/dL (ref 0.0–0.3)
Total Bilirubin: 0.6 mg/dL (ref 0.2–1.2)
Total Protein: 6.9 g/dL (ref 6.0–8.3)

## 2021-01-11 LAB — TSH: TSH: 3.24 u[IU]/mL (ref 0.35–4.50)

## 2021-01-11 LAB — HEMOGLOBIN A1C: Hgb A1c MFr Bld: 5 % (ref 4.6–6.5)

## 2021-01-11 LAB — T3, FREE: T3, Free: 2.6 pg/mL (ref 2.3–4.2)

## 2021-01-11 LAB — T4, FREE: Free T4: 0.65 ng/dL (ref 0.60–1.60)

## 2021-01-11 NOTE — Telephone Encounter (Signed)
Pt would like to see if she can be seen sooner for increase appetite.  Pt declined 01/28/2021 with Dr. Hassan Rowan and was adamant about seeing Dr. Clent Ridges for the issue.  Pt is aware that she needs to see her PCP for it due to it not being acute and is aware that a msg is being sent to ask her provider about being seen sooner.

## 2021-01-11 NOTE — Telephone Encounter (Signed)
error 

## 2021-01-11 NOTE — Progress Notes (Signed)
   Subjective:    Patient ID: Kelsey Poole, female    DOB: 02-23-1961, 60 y.o.   MRN: [id number]  HPI Here complaining of increased appetite for the past 2 months. She says she feels the urge to eat every few hours. When she eats food she is satisfied briefly, but then she starts to feel hungry again. Her weight has been stable. This has been a problem on her job because she cannot stop to eat so frequently. Of note we last saw her on 11-30-20 when she had a LLQ abdominal pain. She says it suddenly stopped after we saw her that day, and it has never bothered her since. She is still scheduled for upper and lower endoscopies on 02-02-21.    Review of Systems  Constitutional: Positive for appetite change.  Respiratory: Negative.   Cardiovascular: Negative.   Gastrointestinal: Negative.        Objective:   Physical Exam Constitutional:      Appearance: Normal appearance.  Cardiovascular:     Rate and Rhythm: Normal rate and regular rhythm.     Pulses: Normal pulses.     Heart sounds: Normal heart sounds.  Pulmonary:     Effort: Pulmonary effort is normal.     Breath sounds: Normal breath sounds.  Abdominal:     General: Abdomen is flat. Bowel sounds are normal. There is no distension.     Palpations: Abdomen is soft. There is no mass.     Tenderness: There is no abdominal tenderness. There is no guarding or rebound.     Hernia: No hernia is present.  Neurological:     Mental Status: She is alert.           Assessment & Plan:  Increased appetite. It is not clear why this is happening. We will get labs today to evaluate thyroid function, etc.  Gershon Crane, MD

## 2021-01-11 NOTE — Telephone Encounter (Signed)
Spoke with the Kelsey Poole and scheduled an appt with Dr Clent Ridges for today to arrive at 2:15pm.

## 2021-01-11 NOTE — Addendum Note (Signed)
Addended by: Gershon Crane A on: 01/11/2021 03:11 PM   Modules accepted: Orders

## 2021-01-12 NOTE — Addendum Note (Signed)
Addended by: Gershon Crane A on: 01/12/2021 10:28 AM   Modules accepted: Orders

## 2021-02-02 ENCOUNTER — Encounter: Payer: Self-pay | Admitting: Gastroenterology

## 2021-02-02 ENCOUNTER — Other Ambulatory Visit: Payer: Self-pay

## 2021-02-02 ENCOUNTER — Ambulatory Visit (AMBULATORY_SURGERY_CENTER): Admitting: Gastroenterology

## 2021-02-02 VITALS — BP 136/100 | HR 69 | Temp 96.8°F | Resp 13 | Ht 70.0 in | Wt 156.0 lb

## 2021-02-02 DIAGNOSIS — K2980 Duodenitis without bleeding: Secondary | ICD-10-CM | POA: Diagnosis not present

## 2021-02-02 DIAGNOSIS — Z1211 Encounter for screening for malignant neoplasm of colon: Secondary | ICD-10-CM

## 2021-02-02 DIAGNOSIS — R194 Change in bowel habit: Secondary | ICD-10-CM

## 2021-02-02 DIAGNOSIS — K2970 Gastritis, unspecified, without bleeding: Secondary | ICD-10-CM

## 2021-02-02 DIAGNOSIS — B9681 Helicobacter pylori [H. pylori] as the cause of diseases classified elsewhere: Secondary | ICD-10-CM | POA: Diagnosis not present

## 2021-02-02 DIAGNOSIS — K259 Gastric ulcer, unspecified as acute or chronic, without hemorrhage or perforation: Secondary | ICD-10-CM | POA: Diagnosis not present

## 2021-02-02 DIAGNOSIS — D638 Anemia in other chronic diseases classified elsewhere: Secondary | ICD-10-CM

## 2021-02-02 DIAGNOSIS — D500 Iron deficiency anemia secondary to blood loss (chronic): Secondary | ICD-10-CM

## 2021-02-02 DIAGNOSIS — K2900 Acute gastritis without bleeding: Secondary | ICD-10-CM

## 2021-02-02 MED ORDER — SODIUM CHLORIDE 0.9 % IV SOLN: 500.0000 mL | Freq: Once | INTRAVENOUS | Status: DC

## 2021-02-02 MED ORDER — PANTOPRAZOLE SODIUM 40 MG PO TBEC: 40.0000 mg | DELAYED_RELEASE_TABLET | Freq: Two times a day (BID) | ORAL | 3 refills | Status: DC

## 2021-02-02 NOTE — Patient Instructions (Signed)
No high dose aspirin, ibuprofen, naproxen, or other non-steroidal anti inflammatory drugs.    YOU HAD AN ENDOSCOPIC PROCEDURE TODAY AT THE  ENDOSCOPY CENTER:   Refer to the procedure report that was given to you for any specific questions about what was found during the examination.  If the procedure report does not answer your questions, please call your gastroenterologist to clarify.  If you requested that your care partner not be given the details of your procedure findings, then the procedure report has been included in a sealed envelope for you to review at your convenience later.  YOU SHOULD EXPECT: Some feelings of bloating in the abdomen. Passage of more gas than usual.  Walking can help get rid of the air that was put into your GI tract during the procedure and reduce the bloating. If you had a lower endoscopy (such as a colonoscopy or flexible sigmoidoscopy) you may notice spotting of blood in your stool or on the toilet paper. If you underwent a bowel prep for your procedure, you may not have a normal bowel movement for a few days.  Please Note:  You might notice some irritation and congestion in your nose or some drainage.  This is from the oxygen used during your procedure.  There is no need for concern and it should clear up in a day or so.  SYMPTOMS TO REPORT IMMEDIATELY:   Following lower endoscopy (colonoscopy or flexible sigmoidoscopy):  Excessive amounts of blood in the stool  Significant tenderness or worsening of abdominal pains  Swelling of the abdomen that is new, acute  Fever of 100F or higher   Following upper endoscopy (EGD)  Vomiting of blood or coffee ground material  New chest pain or pain under the shoulder blades  Painful or persistently difficult swallowing  New shortness of breath  Fever of 100F or higher  Black, tarry-looking stools  For urgent or emergent issues, a gastroenterologist can be reached at any hour by calling [phone]. Do not  use MyChart messaging for urgent concerns.    DIET:  We do recommend a small meal at first, but then you may proceed to your regular diet.  Drink plenty of fluids but you should avoid alcoholic beverages for 24 hours.  ACTIVITY:  You should plan to take it easy for the rest of today and you should NOT DRIVE or use heavy machinery until tomorrow (because of the sedation medicines used during the test).    FOLLOW UP: Our staff will call the number listed on your records 48-72 hours following your procedure to check on you and address any questions or concerns that you may have regarding the information given to you following your procedure. If we do not reach you, we will leave a message.  We will attempt to reach you two times.  During this call, we will ask if you have developed any symptoms of COVID 19. If you develop any symptoms (ie: fever, flu-like symptoms, shortness of breath, cough etc.) before then, please call [phone].  If you test positive for Covid 19 in the 2 weeks post procedure, please call and report this information to [place].    If any biopsies were taken you will be contacted by phone or by letter within the next 1-3 weeks.  Please call us at [phone] if you have not heard about the biopsies in 3 weeks.    SIGNATURES/CONFIDENTIALITY: You and/or your care partner have signed paperwork which will be entered into your electronic  medical record.  These signatures attest to the fact that that the information above on your After Visit Summary has been reviewed and is understood.  Full responsibility of the confidentiality of this discharge information lies with you and/or your care-partner.

## 2021-02-02 NOTE — Progress Notes (Signed)
PT taken to PACU. Monitors in place. VSS. Report given to RN. 

## 2021-02-02 NOTE — Op Note (Signed)
[facility] Patient Name: Kelsey Poole Procedure Date: 02/02/2021 4:02 PM MRN: [id number] Endoscopist: Napoleon Form , MD Age: 60 Referring MD:  Date of Birth: 02-05-1961 Gender: Female Account #: [id number] Procedure:                Colonoscopy Indications:              Screening for colorectal malignant neoplasm Medicines:                Monitored Anesthesia Care Procedure:                Pre-Anesthesia Assessment:                           - Prior to the procedure, a History and Physical                            was performed, and patient medications and                            allergies were reviewed. The patient's tolerance of                            previous anesthesia was also reviewed. The risks                            and benefits of the procedure and the sedation                            options and risks were discussed with the patient.                            All questions were answered, and informed consent                            was obtained. Prior Anticoagulants: The patient has                            taken no previous anticoagulant or antiplatelet                            agents. ASA Grade Assessment: III - A patient with                            severe systemic disease. After reviewing the risks                            and benefits, the patient was deemed in                            satisfactory condition to undergo the procedure.                           After obtaining informed consent, the colonoscope  was passed under direct vision. Throughout the                            procedure, the patient's blood pressure, pulse, and                            oxygen saturations were monitored continuously. The                            Olympus PCF-H190DL [id number]) Colonoscope was                            introduced through the anus and advanced to the the                            cecum,  identified by appendiceal orifice and                            ileocecal valve. The colonoscopy was performed                            without difficulty. The patient tolerated the                            procedure well. The quality of the bowel                            preparation was adequate to identify polyps. The                            ileocecal valve, appendiceal orifice, and rectum                            were photographed. Scope In: 4:20:45 PM Scope Out: 4:31:48 PM Scope Withdrawal Time: 0 hours 5 minutes 55 seconds  Total Procedure Duration: 0 hours 11 minutes 3 seconds  Findings:                 The perianal and digital rectal examinations were                            normal.                           Non-bleeding external and internal hemorrhoids were                            found during retroflexion. The hemorrhoids were                            small. Complications:            No immediate complications. Estimated Blood Loss:     Estimated blood loss was minimal. Impression:               - Non-bleeding external and internal hemorrhoids.                           -  No specimens collected. Recommendation:           - Patient has a contact number available for                            emergencies. The signs and symptoms of potential                            delayed complications were discussed with the                            patient. Return to normal activities tomorrow.                            Written discharge instructions were provided to the                            patient.                           - Resume previous diet.                           - Continue present medications.                           - Repeat colonoscopy in 10 years for screening                            purposes. Napoleon Form, MD 02/02/2021 4:34:40 PM This report has been signed electronically.

## 2021-02-02 NOTE — Progress Notes (Signed)
Called to room to assist during endoscopic procedure.  Patient ID and intended procedure confirmed with present staff. Received instructions for my participation in the procedure from the performing physician.  

## 2021-02-02 NOTE — Progress Notes (Signed)
Pt asking to have bed rail put down so her care partner can sit on the bed. RN explained to pt that we keep the rails up for safety and to prevent falls. Pt instantly got mad and states "I'll do it myself. I asked you please to put it down." RN again tried to explain to pt that we keep the rails up and she began irritable with RN again stating that she would do it herself. RN stated that she would put the rail down but that she would document that the pt was refusing to have the rail up for safety. As RN was walking away pt was heard stating "bitch" towards RN. Then the care partner pulled the curtain back closed after RN left it open. Pt refusing to leave curtain open with care partner at the bedside

## 2021-02-02 NOTE — Op Note (Signed)
[facility] Patient Name: Mylie Mccurley Procedure Date: 02/02/2021 4:02 PM MRN: [id number] Endoscopist: Napoleon Form , MD Age: 60 Referring MD:  Date of Birth: 1961-02-14 Gender: Female Account #: [id number] Procedure:                Upper GI endoscopy Indications:              Suspected upper gastrointestinal bleeding in                            patient with chronic blood loss Medicines:                Monitored Anesthesia Care Procedure:                Pre-Anesthesia Assessment:                           - Prior to the procedure, a History and Physical                            was performed, and patient medications and                            allergies were reviewed. The patient's tolerance of                            previous anesthesia was also reviewed. The risks                            and benefits of the procedure and the sedation                            options and risks were discussed with the patient.                            All questions were answered, and informed consent                            was obtained. Prior Anticoagulants: The patient has                            taken no previous anticoagulant or antiplatelet                            agents. ASA Grade Assessment: III - A patient with                            severe systemic disease. After reviewing the risks                            and benefits, the patient was deemed in                            satisfactory condition to undergo the procedure.  After obtaining informed consent, the endoscope was                            passed under direct vision. Throughout the                            procedure, the patient's blood pressure, pulse, and                            oxygen saturations were monitored continuously. The                            Endoscope was introduced through the mouth, and                            advanced to the  second part of duodenum. The upper                            GI endoscopy was accomplished without difficulty.                            The patient tolerated the procedure well. Scope In: Scope Out: Findings:                 The Z-line was regular and was found 37 cm from the                            incisors.                           No gross lesions were noted in the entire esophagus.                           Patchy mild inflammation characterized by                            congestion (edema), erosions and erythema was found                            in the gastric antrum and in the prepyloric region                            of the stomach. Biopsies were taken with a cold                            forceps for Helicobacter pylori testing.                           One non-bleeding superficial gastric ulcer with no                            stigmata of bleeding was found in the gastric  antrum. The lesion was 2 mm in largest dimension.                           Multiple localized erosions without bleeding were                            found in the duodenal bulb. Biopsies were taken                            with a cold forceps for histology. Complications:            No immediate complications. Estimated Blood Loss:     Estimated blood loss was minimal. Impression:               - Z-line regular, 37 cm from the incisors.                           - No gross lesions in esophagus.                           - Gastritis. Biopsied.                           - Non-bleeding gastric ulcer with no stigmata of                            bleeding.                           - Duodenal erosions without bleeding. Biopsied. Recommendation:           - Resume previous diet.                           - Continue present medications.                           - Await pathology results.                           - Use Protonix (pantoprazole) 40 mg PO BID for 3                             months.                           - No high dose aspirin, ibuprofen, naproxen, or                            other non-steroidal anti-inflammatory drugs. Napoleon Form, MD 02/02/2021 4:37:54 PM This report has been signed electronically.

## 2021-02-02 NOTE — Progress Notes (Signed)
[facility]  History reviewed.  Doctor aware of fluid consumed at 2 pm

## 2021-02-04 ENCOUNTER — Telehealth: Payer: Self-pay | Admitting: *Deleted

## 2021-02-04 NOTE — Telephone Encounter (Signed)
Left voicemail for patient. Unable to reach concerning a recent complaint regarding her visit to Endoscopy.

## 2021-02-04 NOTE — Telephone Encounter (Signed)
Continuation of previous note. Computer timeout closed event concerning patient concern. The patient did complete the procedure, once reassured by room staff.  In closing, the patient again thanked [place] for taking care of her and allowing her to voice her concerns.  She said she had been upset for two days about this, feeling she was 'talked to like a child.'  She had visited [place] offices before with positive experiences and shared that overall, she was pleased with the staff and care other than these concerns.  She stated her appreciation for our call and discussion today. Advised we are always striving to make our patients feel cared for and provide a pleasant experience for all and thanked her for sharing her thoughts.  Overall, we had a calm discussion with the patient acknowledging many good points regarding her visit along with her concerns.  Will share her feedback with staff and provider.

## 2021-02-04 NOTE — Telephone Encounter (Signed)
1. Have you developed a fever since your procedure? no  2.   Have you had an respiratory symptoms (SOB or cough) since your procedure? no  3.   Have you tested positive for COVID 19 since your procedure no  4.   Have you had any family members/close contacts diagnosed with the COVID 19 since your procedure?  no   If yes to any of these questions please route to Laverna Peace, RN and Karlton Lemon, Follow up Call-  Call back number 02/02/2021  Post procedure Call Back phone  # [phone]  Permission to leave phone message Yes  Some recent data might be hidden     Patient questions:  Do you have a fever, pain , or abdominal swelling? No. Pain Score  0 *  Have you tolerated food without any problems? Yes.    Have you been able to return to your normal activities? Yes.    Do you have any questions about your discharge instructions: Diet   No. Medications  No. Follow up visit  No.  Do you have questions or concerns about your Care? No.  Actions: * If pain score is 4 or above: No action needed, pain <4.
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
## 2021-02-11 ENCOUNTER — Other Ambulatory Visit: Payer: Self-pay

## 2021-02-11 DIAGNOSIS — A048 Other specified bacterial intestinal infections: Secondary | ICD-10-CM

## 2021-02-11 MED ORDER — PYLERA 140-125-125 MG PO CAPS: 3.0000 | ORAL_CAPSULE | Freq: Three times a day (TID) | ORAL | 0 refills | Status: DC

## 2021-04-06 ENCOUNTER — Telehealth: Payer: Self-pay | Admitting: Gastroenterology

## 2021-04-06 ENCOUNTER — Other Ambulatory Visit: Payer: Self-pay

## 2021-04-06 ENCOUNTER — Telehealth: Payer: Self-pay | Admitting: Family Medicine

## 2021-04-06 DIAGNOSIS — A048 Other specified bacterial intestinal infections: Secondary | ICD-10-CM

## 2021-04-06 NOTE — Telephone Encounter (Signed)
Pylera is first line medication to treat H.pylori. Please refer pt to ID to discuss potential alternate treatments.

## 2021-04-06 NOTE — Telephone Encounter (Signed)
PT called wanting to speak to Dr.Fry or Dr.Koberlein in regards to a colonoscopy they had done. The Dr. Ronda Fairly her she has bacteria and prescribed some meds that state she can not have alcohol. She states she thinks the Dr does not like her as she states she is a acholic and would like some advise from Dr.Fry preferably but if not Dr.Koberlein.

## 2021-04-06 NOTE — Telephone Encounter (Signed)
Spoke with the patient. She mentions the incident in the [facility] and that she does not trust [place]. I did interrupt her to tell her I had called to let her know that we can send her to ID. The provider there will help to determine an alternative medication for treatment of her H Pylori. She expresses understanding and will await the phone call.
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
## 2021-04-06 NOTE — Telephone Encounter (Signed)
She needs to call GI to discuss. The problem is that there are alternatives to avoid using flagyl (the med that interacts with alcohol aka metronidazole), but the alternative treatments involve clarithromycin, and she has documented allergy to azithromycin (which is in the same category). So I am not sure what alternative she could do that would eradicate the h pylori and not interact with alcohol or be problematic with her allergy history.

## 2021-04-06 NOTE — Telephone Encounter (Signed)
No answer at the patient's cell number. ?

## 2021-04-06 NOTE — Telephone Encounter (Signed)
She did not take Pylera because she says she cannot avoid alcohol for 10 days. Are there other options?

## 2021-04-07 NOTE — Telephone Encounter (Signed)
Spoke with the patient and informed her of the message below.  

## 2021-04-12 ENCOUNTER — Telehealth: Payer: Self-pay | Admitting: Gastroenterology

## 2021-04-12 NOTE — Telephone Encounter (Signed)
Patient dismissed from [facility] by Marsa Aris, MD, effective 04/06/21. Dismissal Letter sent out by 1st class mail. KLM

## 2021-04-18 ENCOUNTER — Ambulatory Visit: Admitting: Infectious Disease

## 2021-04-25 ENCOUNTER — Encounter: Payer: Self-pay | Admitting: Infectious Disease

## 2021-04-25 ENCOUNTER — Other Ambulatory Visit: Payer: Self-pay

## 2021-04-25 ENCOUNTER — Ambulatory Visit (HOSPITAL_COMMUNITY): Admitting: Infectious Disease

## 2021-04-25 VITALS — BP 122/78 | HR 79 | Temp 98.1°F | Wt 156.4 lb

## 2021-04-25 DIAGNOSIS — K2970 Gastritis, unspecified, without bleeding: Secondary | ICD-10-CM

## 2021-04-25 DIAGNOSIS — F17200 Nicotine dependence, unspecified, uncomplicated: Secondary | ICD-10-CM

## 2021-04-25 DIAGNOSIS — I129 Hypertensive chronic kidney disease with stage 1 through stage 4 chronic kidney disease, or unspecified chronic kidney disease: Secondary | ICD-10-CM

## 2021-04-25 DIAGNOSIS — B9681 Helicobacter pylori [H. pylori] as the cause of diseases classified elsewhere: Secondary | ICD-10-CM

## 2021-04-25 DIAGNOSIS — N1832 Chronic kidney disease, stage 3b: Secondary | ICD-10-CM

## 2021-04-25 DIAGNOSIS — F1010 Alcohol abuse, uncomplicated: Secondary | ICD-10-CM

## 2021-04-25 DIAGNOSIS — Z881 Allergy status to other antibiotic agents status: Secondary | ICD-10-CM | POA: Insufficient documentation

## 2021-04-25 DIAGNOSIS — T7431XA Adult psychological abuse, confirmed, initial encounter: Secondary | ICD-10-CM

## 2021-04-25 DIAGNOSIS — R45850 Homicidal ideations: Secondary | ICD-10-CM

## 2021-04-25 DIAGNOSIS — I10 Essential (primary) hypertension: Secondary | ICD-10-CM

## 2021-04-25 DIAGNOSIS — F322 Major depressive disorder, single episode, severe without psychotic features: Secondary | ICD-10-CM

## 2021-04-25 DIAGNOSIS — U071 COVID-19: Secondary | ICD-10-CM

## 2021-04-25 DIAGNOSIS — J432 Centrilobular emphysema: Secondary | ICD-10-CM

## 2021-04-25 HISTORY — DX: Allergy status to other antibiotic agents: Z88.1

## 2021-04-25 HISTORY — DX: Homicidal ideations: R45.850

## 2021-04-25 HISTORY — DX: Helicobacter pylori (H. pylori) as the cause of diseases classified elsewhere: B96.81

## 2021-04-25 HISTORY — DX: Adult psychological abuse, confirmed, initial encounter: T74.31XA

## 2021-04-25 MED ORDER — PANTOPRAZOLE SODIUM 40 MG PO TBEC: 40.0000 mg | DELAYED_RELEASE_TABLET | Freq: Two times a day (BID) | ORAL | 0 refills | Status: DC

## 2021-04-25 MED ORDER — AMOXICILLIN 250 MG PO CAPS: 750.0000 mg | ORAL_CAPSULE | Freq: Three times a day (TID) | ORAL | 0 refills | Status: AC

## 2021-04-25 MED ORDER — LEVOFLOXACIN 500 MG PO TABS: 500.0000 mg | ORAL_TABLET | Freq: Every day | ORAL | 0 refills | Status: DC
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
## 2021-05-11 ENCOUNTER — Telehealth: Payer: Self-pay

## 2021-05-11 ENCOUNTER — Other Ambulatory Visit: Payer: Self-pay | Admitting: Infectious Disease

## 2021-05-11 DIAGNOSIS — B373 Candidiasis of vulva and vagina: Secondary | ICD-10-CM

## 2021-05-11 DIAGNOSIS — R112 Nausea with vomiting, unspecified: Secondary | ICD-10-CM

## 2021-05-11 DIAGNOSIS — B3731 Acute candidiasis of vulva and vagina: Secondary | ICD-10-CM

## 2021-05-11 MED ORDER — PROMETHAZINE HCL 25 MG PO TABS: 25.0000 mg | ORAL_TABLET | Freq: Four times a day (QID) | ORAL | 0 refills | Status: DC | PRN

## 2021-05-11 MED ORDER — CLOTRIMAZOLE 2 % VA CREA: 1.0000 | TOPICAL_CREAM | Freq: Every day | VAGINAL | Status: AC

## 2021-05-11 NOTE — Telephone Encounter (Signed)
Patient called c/o nausea and vomiting and yeast infection since taking Amoxicillin and Levofloxacin. Please advise. [facility] using [facility]   [address] [place], [place]

## 2021-06-08 ENCOUNTER — Ambulatory Visit: Admitting: Infectious Disease

## 2021-08-10 ENCOUNTER — Other Ambulatory Visit: Payer: Self-pay

## 2021-08-10 ENCOUNTER — Ambulatory Visit (INDEPENDENT_AMBULATORY_CARE_PROVIDER_SITE_OTHER): Admitting: Infectious Disease

## 2021-08-10 VITALS — BP 131/84 | HR 91 | Resp 16 | Ht 70.0 in | Wt 147.8 lb

## 2021-08-10 DIAGNOSIS — B9681 Helicobacter pylori [H. pylori] as the cause of diseases classified elsewhere: Secondary | ICD-10-CM

## 2021-08-10 DIAGNOSIS — I10 Essential (primary) hypertension: Secondary | ICD-10-CM

## 2021-08-10 DIAGNOSIS — N1832 Chronic kidney disease, stage 3b: Secondary | ICD-10-CM

## 2021-08-10 DIAGNOSIS — K2970 Gastritis, unspecified, without bleeding: Secondary | ICD-10-CM

## 2021-08-10 DIAGNOSIS — F1010 Alcohol abuse, uncomplicated: Secondary | ICD-10-CM | POA: Diagnosis not present

## 2021-08-10 DIAGNOSIS — Z881 Allergy status to other antibiotic agents status: Secondary | ICD-10-CM
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
## 2021-08-23 ENCOUNTER — Other Ambulatory Visit

## 2021-08-23 ENCOUNTER — Other Ambulatory Visit: Payer: Self-pay

## 2021-08-23 DIAGNOSIS — K2970 Gastritis, unspecified, without bleeding: Secondary | ICD-10-CM

## 2021-08-24 ENCOUNTER — Telehealth: Payer: Self-pay

## 2021-08-24 LAB — HELICOBACTER PYLORI  SPECIAL ANTIGEN
MICRO NUMBER:: 12780254
SPECIMEN QUALITY: ADEQUATE

## 2021-08-24 NOTE — Telephone Encounter (Signed)
Patient made aware of results. Patient was very appreciative of call and would to cancel her upcoming appointment with Dr. Luciana Axe next week.

## 2021-08-24 NOTE — Telephone Encounter (Signed)
-----   Message from Randall Hiss, MD sent at 08/24/2021  3:43 PM EST ----- Test is negative, pt appears cured ----- Message ----- From: Janace Hoard Lab Results In Sent: 08/24/2021   2:18 PM EST To: Randall Hiss, MD

## 2021-08-25 ENCOUNTER — Telehealth: Payer: Self-pay

## 2021-08-25 NOTE — Telephone Encounter (Signed)
Attempted to reach patient to discuss results and VM is full.

## 2021-08-25 NOTE — Telephone Encounter (Signed)
-----   Message from Randall Hiss, MD sent at 08/24/2021  3:43 PM EST ----- Test is negative, pt appears cured ----- Message ----- From: Janace Hoard Lab Results In Sent: 08/24/2021   2:18 PM EST To: Randall Hiss, MD

## 2021-09-01 ENCOUNTER — Ambulatory Visit: Admitting: Internal Medicine

## 2021-09-26 ENCOUNTER — Ambulatory Visit (INDEPENDENT_AMBULATORY_CARE_PROVIDER_SITE_OTHER): Admitting: Family Medicine

## 2021-09-26 ENCOUNTER — Encounter: Payer: Self-pay | Admitting: Family Medicine

## 2021-09-26 VITALS — BP 98/70 | HR 95 | Temp 98.6°F | Wt 150.5 lb

## 2021-09-26 DIAGNOSIS — G8929 Other chronic pain: Secondary | ICD-10-CM | POA: Diagnosis not present

## 2021-09-26 DIAGNOSIS — M25511 Pain in right shoulder: Secondary | ICD-10-CM

## 2021-09-26 DIAGNOSIS — M25512 Pain in left shoulder: Secondary | ICD-10-CM | POA: Diagnosis not present

## 2021-09-26 NOTE — Progress Notes (Signed)
° °  Subjective:    Patient ID: CHAROLETT YARROW, female    DOB: 06/29/1961, 61 y.o.   MRN: [id number]  HPI Here for 6 months of pain in both shoulders, right worse than left. No neck or back pain. 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 She has tried taking Tylenol and Ibuprofen with no relief.    Review of Systems  Constitutional: Negative.   Respiratory: Negative.    Cardiovascular: Negative.   Musculoskeletal:  Positive for arthralgias.      Objective:   Physical Exam Constitutional:      Appearance: Normal appearance.  Cardiovascular:     Rate and Rhythm: Normal rate and regular rhythm.     Pulses: Normal pulses.     Heart sounds: Normal heart sounds.  Pulmonary:     Effort: Pulmonary effort is normal.     Breath sounds: Normal breath sounds.  Musculoskeletal:     Comments: The left shoulder is unremarkable. The right shoulder is quite tender anteriorly. ROM is limited in all directions by pain. No crepitus   Neurological:     Mental Status: She is alert.          Assessment & Plan:  Bilateral shoulder pain, likely due to bursitis. She may apply ice packs. Refer to Sports Medicine.  Gershon Crane, MD

## 2021-09-28 NOTE — Progress Notes (Signed)
I, Philbert Riser, LAT, ATC acting as a scribe for Clementeen Graham, MD.  Subjective:    CC: Bilat shoulder pain  HPI: Pt is a 61 y/o female c/o bilat shoulder pain, R>L, ongoing for 6+ months. 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 Pt locates pain to the anterior aspect of bilat shoulders. Pt is R-hand dominate. Pt c/o R hand will go "numb" when she is writing  Radiates: no Mechanical symptoms:no UE numbness/tingling: no UE weakness: no Aggravates: writing, shoulder ABd Treatments tried: IBU, Tylenol, creams  Pertinent review of Systems: no fever or chills  Relevant historical information: She does not have a car and rides the bus or walks to the grocery store and has to carry groceries home.  She is also caring for her sick husband.  She also notes she has been drinking more heavily than usual.   Objective:    Vitals:   09/29/21 1235  BP: 132/88  Pulse: 87  SpO2: 98%   General: Well Developed, well nourished, and in no acute distress.   MSK:  C-spine normal cervical motion nontender.  Right shoulder normal appearing Nontender. Range of motion abduction 120 degrees.  Internal rotation lumbar spine.  External rotation full. Strength: Abduction 4/5 external rotation 5/5 internal rotation 4/5. Positive Hawkins and Neer's test.  Positive empty can test. Negative Yergason's and speeds test.  Left shoulder:  Normal-appearing Nontender. Range of motion abduction 130 degrees.  Internal rotation lumbar spine.  External rotation full. Strength abduction 4/5 external rotation 4/5 internal rotation 5/5. Positive Hawkins and Neer's test.  Positive empty can test. Negative Yergason's and speeds test.  Right wrist normal-appearing Negative Tinel's positive Phalen's test.  Intact grip strength.  Pulses cap refill and sensation are intact distally.  Lab and Radiology Results  Procedure: Real-time Ultrasound Guided Injection of left  shoulder subacromial bursa Device: Philips Affiniti 50G Images permanently stored and available for review in PACS Ultrasound evaluation prior to injection reveals moderate subacromial bursitis.  Intact rotator cuff tendons however supraspinatus tendon has scattered hyperechoic change consistent with calcific tendinitis. Verbal informed consent obtained.  Discussed risks and benefits of procedure. Warned about infection bleeding damage to structures skin hypopigmentation and fat atrophy among others. Patient expresses understanding and agreement Time-out conducted.   Noted no overlying erythema, induration, or other signs of local infection.   Skin prepped in a sterile fashion.   Local anesthesia: Topical Ethyl chloride.   With sterile technique and under real time ultrasound guidance: 40 mg of Kenalog and 2 mL of Marcaine injected into subacromial bursa. Fluid seen entering the bursa.   Completed without difficulty   Pain immediately resolved suggesting accurate placement of the medication.   Advised to call if fevers/chills, erythema, induration, drainage, or persistent bleeding.   Images permanently stored and available for review in the ultrasound unit.  Impression: Technically successful ultrasound guided injection.    Procedure: Real-time Ultrasound Guided Injection of left shoulder subacromial bursa Device: Philips Affiniti 50G Images permanently stored and available for review in PACS Ultrasound evaluation prior to injection reveals intact rotator cuff tendons.  Scattered hyperechoic change within the rotator cuff tendons and indicate calcific tendinitis. Mild subacromial bursitis is present. Verbal informed consent obtained.  Discussed risks and benefits of procedure. Warned about infection bleeding damage to structures skin hypopigmentation and fat atrophy among others. Patient expresses understanding and agreement Time-out conducted.   Noted no overlying erythema, induration, or  other signs of local  infection.   Skin prepped in a sterile fashion.   Local anesthesia: Topical Ethyl chloride.   With sterile technique and under real time ultrasound guidance: 40 mg of Kenalog and 2 mL of Marcaine injected into subacromial bursa. Fluid seen entering the bursa.   Completed without difficulty   Pain immediately resolved suggesting accurate placement of the medication.   Advised to call if fevers/chills, erythema, induration, drainage, or persistent bleeding.   Images permanently stored and available for review in the ultrasound unit.  Impression: Technically successful ultrasound guided injection.    X-ray images bilateral shoulders obtained today personally and independently interpreted  Right shoulder: Mild high riding humeral head. Mild chondrocalcinosis or calcific change at rotator cuff tendon insertion site present.  No significant glenohumeral DJD.  Left shoulder: No acute fractures.  No significant glenohumeral DJD.  Await formal radiology review    Impression and Recommendations:    Assessment and Plan: 61 y.o. female with bilateral shoulder pain thought to be due to rotator cuff tendinopathy and subacromial bursitis.  Plan for subacromial injection today and home exercise program taught in clinic today by ATC.  Recheck back in 1 month.  She also has some right hand paresthesias thought to be carpal tunnel syndrome.  She will try using carpal tunnel braces at night  Recheck in 1 month.Marland Kitchen  PDMP not reviewed this encounter. Orders Placed This Encounter  Procedures   DG Shoulder Left    Standing Status:   Future    Standing Expiration Date:   09/28/2022    Order Specific Question:   Reason for Exam (SYMPTOM  OR DIAGNOSIS REQUIRED)    Answer:   bilateral shoulder pain    Order Specific Question:   Preferred imaging location?    Answer:   [facility]    Order Specific Question:   Is patient pregnant?    Answer:   No   DG Shoulder Right     Standing Status:   Future    Standing Expiration Date:   09/28/2022    Order Specific Question:   Reason for Exam (SYMPTOM  OR DIAGNOSIS REQUIRED)    Answer:   bilateral shoulder pain    Order Specific Question:   Preferred imaging location?    Answer:   [facility]    Order Specific Question:   Is patient pregnant?    Answer:   No   [place] LIMITED JOINT SPACE STRUCTURES UP BILAT(NO LINKED CHARGES)    Standing Status:   Future    Number of Occurrences:   1    Standing Expiration Date:   03/28/2022    Order Specific Question:   Reason for Exam (SYMPTOM  OR DIAGNOSIS REQUIRED)    Answer:   bilateral shoulder pain    Order Specific Question:   Preferred imaging location?    Answer:   [facility]   No orders of the defined types were placed in this encounter.   Discussed warning signs or symptoms. Please see discharge instructions. Patient expresses understanding.   The above documentation has been reviewed and is accurate and complete Clementeen Graham, M.D.

## 2021-09-29 ENCOUNTER — Ambulatory Visit: Payer: Self-pay

## 2021-09-29 ENCOUNTER — Ambulatory Visit (INDEPENDENT_AMBULATORY_CARE_PROVIDER_SITE_OTHER): Admitting: Family Medicine

## 2021-09-29 ENCOUNTER — Other Ambulatory Visit: Payer: Self-pay

## 2021-09-29 ENCOUNTER — Ambulatory Visit (INDEPENDENT_AMBULATORY_CARE_PROVIDER_SITE_OTHER)

## 2021-09-29 VITALS — BP 132/88 | HR 87 | Ht 70.0 in | Wt 152.2 lb

## 2021-09-29 DIAGNOSIS — M25512 Pain in left shoulder: Secondary | ICD-10-CM | POA: Diagnosis not present

## 2021-09-29 DIAGNOSIS — M7551 Bursitis of right shoulder: Secondary | ICD-10-CM

## 2021-09-29 DIAGNOSIS — M7552 Bursitis of left shoulder: Secondary | ICD-10-CM | POA: Insufficient documentation

## 2021-09-29 DIAGNOSIS — G8929 Other chronic pain: Secondary | ICD-10-CM

## 2021-09-29 DIAGNOSIS — G5601 Carpal tunnel syndrome, right upper limb: Secondary | ICD-10-CM

## 2021-09-29 DIAGNOSIS — M25511 Pain in right shoulder: Secondary | ICD-10-CM

## 2021-09-29 NOTE — Patient Instructions (Addendum)
Thank you for coming in today.   You received steroid injections in both of your shoulders today. Seek immediate medical attention if the joint becomes red, extremely painful, or is oozing fluid.   Please complete the exercises that the athletic trainer went over with you:  View at [email or web address] using code: [id number]  Please get an Xray today before you leave   Wear a wrist brace on your right hand at night.  Recheck back in 1 month

## 2021-10-03 NOTE — Progress Notes (Signed)
Right shoulder x-ray shows some arthritis of the acromioclavicular joint.  This is the small joint at the top of the shoulder.

## 2021-10-03 NOTE — Progress Notes (Signed)
Left shoulder x-ray shows a little bit of arthritis of the acromioclavicular joint.  This is a small joint at the top of the shoulder.

## 2021-10-31 NOTE — Progress Notes (Unsigned)
° °  I, Peterson Lombard, LAT, ATC acting as a scribe for Lynne Leader, MD.  Kelsey Poole is a 61 y.o. female who presents to [facility] today for f/u bilat shoulder pain and R hand paresthesias. 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 Pt is R-hand dominate. Pt was last seen by Dr. Georgina Snell on 09/29/21 and was given bilat subacromial steroid injections and taught HEP. Pt was also advised to use a night wrist brace. Today, pt reports  Dx imaging: 09/29/21 R & L shoulder XR  Pertinent review of systems: ***  Relevant historical information: ***   Exam:  There were no vitals taken for this visit. General: Well Developed, well nourished, and in no acute distress.   MSK: ***    Lab and Radiology Results No results found for this or any previous visit (from the past 72 hour(s)). No results found.     Assessment and Plan: 61 y.o. female with ***   PDMP not reviewed this encounter. No orders of the defined types were placed in this encounter.  No orders of the defined types were placed in this encounter.    Discussed warning signs or symptoms. Please see discharge instructions. Patient expresses understanding.   ***

## 2021-11-01 ENCOUNTER — Ambulatory Visit: Admitting: Family Medicine

## 2022-01-06 ENCOUNTER — Other Ambulatory Visit: Payer: Self-pay | Admitting: *Deleted

## 2022-01-06 DIAGNOSIS — F17210 Nicotine dependence, cigarettes, uncomplicated: Secondary | ICD-10-CM

## 2022-01-06 DIAGNOSIS — Z122 Encounter for screening for malignant neoplasm of respiratory organs: Secondary | ICD-10-CM

## 2022-01-06 DIAGNOSIS — Z87891 Personal history of nicotine dependence: Secondary | ICD-10-CM

## 2022-01-06 NOTE — Progress Notes (Signed)
Ct c 

## 2022-01-26 ENCOUNTER — Ambulatory Visit
Admission: RE | Admit: 2022-01-26 | Discharge: 2022-01-26 | Disposition: A | Discharge status: Home / Self Care | Source: Ambulatory Visit | Attending: Acute Care | Admitting: Acute Care

## 2022-01-26 DIAGNOSIS — Z87891 Personal history of nicotine dependence: Secondary | ICD-10-CM

## 2022-01-26 DIAGNOSIS — Z122 Encounter for screening for malignant neoplasm of respiratory organs: Secondary | ICD-10-CM

## 2022-01-26 DIAGNOSIS — F17210 Nicotine dependence, cigarettes, uncomplicated: Secondary | ICD-10-CM

## 2022-01-31 ENCOUNTER — Other Ambulatory Visit: Payer: Self-pay | Admitting: Acute Care

## 2022-01-31 DIAGNOSIS — Z122 Encounter for screening for malignant neoplasm of respiratory organs: Secondary | ICD-10-CM

## 2022-01-31 DIAGNOSIS — F17210 Nicotine dependence, cigarettes, uncomplicated: Secondary | ICD-10-CM

## 2022-01-31 DIAGNOSIS — Z87891 Personal history of nicotine dependence: Secondary | ICD-10-CM

## 2022-04-06 ENCOUNTER — Encounter (HOSPITAL_COMMUNITY): Payer: Self-pay | Admitting: Internal Medicine

## 2022-04-06 ENCOUNTER — Inpatient Hospital Stay (HOSPITAL_COMMUNITY)
Admission: EM | Admit: 2022-04-06 | Discharge: 2022-04-08 | DRG: 440 | Disposition: A | Discharge status: Home / Self Care | Attending: Internal Medicine | Admitting: Internal Medicine

## 2022-04-06 ENCOUNTER — Emergency Department (HOSPITAL_COMMUNITY)

## 2022-04-06 ENCOUNTER — Other Ambulatory Visit: Payer: Self-pay

## 2022-04-06 DIAGNOSIS — F17210 Nicotine dependence, cigarettes, uncomplicated: Secondary | ICD-10-CM | POA: Diagnosis present

## 2022-04-06 DIAGNOSIS — F1020 Alcohol dependence, uncomplicated: Secondary | ICD-10-CM | POA: Diagnosis present

## 2022-04-06 DIAGNOSIS — I129 Hypertensive chronic kidney disease with stage 1 through stage 4 chronic kidney disease, or unspecified chronic kidney disease: Secondary | ICD-10-CM | POA: Diagnosis present

## 2022-04-06 DIAGNOSIS — K859 Acute pancreatitis without necrosis or infection, unspecified: Principal | ICD-10-CM | POA: Diagnosis present

## 2022-04-06 DIAGNOSIS — N183 Chronic kidney disease, stage 3 unspecified: Secondary | ICD-10-CM | POA: Diagnosis present

## 2022-04-06 DIAGNOSIS — Z79899 Other long term (current) drug therapy: Secondary | ICD-10-CM | POA: Diagnosis not present

## 2022-04-06 DIAGNOSIS — Z881 Allergy status to other antibiotic agents status: Secondary | ICD-10-CM

## 2022-04-06 DIAGNOSIS — Z8249 Family history of ischemic heart disease and other diseases of the circulatory system: Secondary | ICD-10-CM | POA: Diagnosis not present

## 2022-04-06 DIAGNOSIS — D539 Nutritional anemia, unspecified: Secondary | ICD-10-CM | POA: Diagnosis present

## 2022-04-06 DIAGNOSIS — Z886 Allergy status to analgesic agent status: Secondary | ICD-10-CM | POA: Diagnosis not present

## 2022-04-06 DIAGNOSIS — J439 Emphysema, unspecified: Secondary | ICD-10-CM | POA: Diagnosis present

## 2022-04-06 DIAGNOSIS — F1010 Alcohol abuse, uncomplicated: Secondary | ICD-10-CM | POA: Diagnosis not present

## 2022-04-06 DIAGNOSIS — Z825 Family history of asthma and other chronic lower respiratory diseases: Secondary | ICD-10-CM

## 2022-04-06 DIAGNOSIS — N1832 Chronic kidney disease, stage 3b: Secondary | ICD-10-CM | POA: Diagnosis present

## 2022-04-06 DIAGNOSIS — I160 Hypertensive urgency: Secondary | ICD-10-CM | POA: Diagnosis not present

## 2022-04-06 LAB — CBC WITH DIFFERENTIAL/PLATELET
Abs Immature Granulocytes: 0.03 10*3/uL (ref 0.00–0.07)
Basophils Absolute: 0 10*3/uL (ref 0.0–0.1)
Basophils Relative: 0 %
Eosinophils Absolute: 0 10*3/uL (ref 0.0–0.5)
Eosinophils Relative: 0 %
HCT: 33.6 % — ABNORMAL LOW (ref 36.0–46.0)
Hemoglobin: 11.9 g/dL — ABNORMAL LOW (ref 12.0–15.0)
Immature Granulocytes: 0 %
Lymphocytes Relative: 18 %
Lymphs Abs: 2 10*3/uL (ref 0.7–4.0)
MCH: 38.5 pg — ABNORMAL HIGH (ref 26.0–34.0)
MCHC: 35.4 g/dL (ref 30.0–36.0)
MCV: 108.7 fL — ABNORMAL HIGH (ref 80.0–100.0)
Monocytes Absolute: 0.5 10*3/uL (ref 0.1–1.0)
Monocytes Relative: 5 %
Neutro Abs: 8.4 10*3/uL — ABNORMAL HIGH (ref 1.7–7.7)
Neutrophils Relative %: 77 %
Platelets: 351 10*3/uL (ref 150–400)
RBC: 3.09 MIL/uL — ABNORMAL LOW (ref 3.87–5.11)
RDW: 13 % (ref 11.5–15.5)
WBC: 11 10*3/uL — ABNORMAL HIGH (ref 4.0–10.5)
nRBC: 0 % (ref 0.0–0.2)

## 2022-04-06 LAB — COMPREHENSIVE METABOLIC PANEL
ALT: 28 U/L (ref 0–44)
AST: 62 U/L — ABNORMAL HIGH (ref 15–41)
Albumin: 4.4 g/dL (ref 3.5–5.0)
Alkaline Phosphatase: 120 U/L (ref 38–126)
Anion gap: 16 — ABNORMAL HIGH (ref 5–15)
BUN: 15 mg/dL (ref 8–23)
CO2: 18 mmol/L — ABNORMAL LOW (ref 22–32)
Calcium: 9.7 mg/dL (ref 8.9–10.3)
Chloride: 105 mmol/L (ref 98–111)
Creatinine, Ser: 2.04 mg/dL — ABNORMAL HIGH (ref 0.44–1.00)
GFR, Estimated: 27 mL/min — ABNORMAL LOW (ref >60)
Glucose, Bld: 127 mg/dL — ABNORMAL HIGH (ref 70–99)
Potassium: 3.7 mmol/L (ref 3.5–5.1)
Sodium: 139 mmol/L (ref 135–145)
Total Bilirubin: 1.3 mg/dL — ABNORMAL HIGH (ref 0.3–1.2)
Total Protein: 7.3 g/dL (ref 6.5–8.1)

## 2022-04-06 LAB — URINALYSIS, ROUTINE W REFLEX MICROSCOPIC
Bilirubin Urine: NEGATIVE
Glucose, UA: NEGATIVE mg/dL
Ketones, ur: NEGATIVE mg/dL
Nitrite: NEGATIVE
Protein, ur: 100 mg/dL — AB
Specific Gravity, Urine: 1.016 (ref 1.005–1.030)
pH: 5 (ref 5.0–8.0)

## 2022-04-06 LAB — LIPASE, BLOOD: Lipase: 209 U/L — ABNORMAL HIGH (ref 11–51)

## 2022-04-06 MED ORDER — HYDROCODONE-ACETAMINOPHEN 5-325 MG PO TABS
1.0000 | ORAL_TABLET | Freq: Once | ORAL | Status: AC
  Administered 2022-04-06: 1 via ORAL
  Filled 2022-04-06: qty 1

## 2022-04-06 MED ORDER — LORAZEPAM 2 MG/ML IJ SOLN
0.0000 mg | Freq: Four times a day (QID) | INTRAMUSCULAR | Status: DC
  Administered 2022-04-06: 2 mg via INTRAVENOUS
  Administered 2022-04-08: 1 mg via INTRAVENOUS
  Filled 2022-04-06 (×2): qty 1

## 2022-04-06 MED ORDER — LACTATED RINGERS IV BOLUS
1000.0000 mL | Freq: Once | INTRAVENOUS | Status: AC
  Administered 2022-04-06: 1000 mL via INTRAVENOUS

## 2022-04-06 MED ORDER — ADULT MULTIVITAMIN W/MINERALS CH
1.0000 | ORAL_TABLET | Freq: Every day | ORAL | Status: DC
Start: 2022-04-06 — End: 2022-04-09
  Filled 2022-04-06 (×3): qty 1

## 2022-04-06 MED ORDER — HEPARIN SODIUM (PORCINE) 5000 UNIT/ML IJ SOLN
5000.0000 [IU] | Freq: Three times a day (TID) | INTRAMUSCULAR | Status: DC
Start: 2022-04-06 — End: 2022-04-09
  Administered 2022-04-07 – 2022-04-08 (×6): 5000 [IU] via SUBCUTANEOUS
  Filled 2022-04-06 (×8): qty 1

## 2022-04-06 MED ORDER — MORPHINE SULFATE (PF) 2 MG/ML IV SOLN: 2.0000 mg | INTRAVENOUS | Status: DC | PRN

## 2022-04-06 MED ORDER — MORPHINE SULFATE (PF) 4 MG/ML IV SOLN
4.0000 mg | Freq: Once | INTRAVENOUS | Status: AC
  Administered 2022-04-06: 4 mg via INTRAVENOUS
  Filled 2022-04-06: qty 1

## 2022-04-06 MED ORDER — THIAMINE HCL 100 MG PO TABS
100.0000 mg | ORAL_TABLET | Freq: Every day | ORAL | Status: DC
  Administered 2022-04-07 – 2022-04-08 (×2): 100 mg via ORAL
  Filled 2022-04-06 (×3): qty 1

## 2022-04-06 MED ORDER — THIAMINE HCL 100 MG/ML IJ SOLN
100.0000 mg | Freq: Every day | INTRAMUSCULAR | Status: DC
  Administered 2022-04-07: 100 mg via INTRAVENOUS
  Filled 2022-04-06: qty 2

## 2022-04-06 MED ORDER — LORAZEPAM 2 MG/ML IJ SOLN: 1.0000 mg | INTRAMUSCULAR | Status: DC | PRN

## 2022-04-06 MED ORDER — LORAZEPAM 2 MG/ML IJ SOLN: 0.0000 mg | Freq: Two times a day (BID) | INTRAMUSCULAR | Status: DC

## 2022-04-06 MED ORDER — ONDANSETRON HCL 4 MG/2ML IJ SOLN
4.0000 mg | Freq: Once | INTRAMUSCULAR | Status: AC
  Administered 2022-04-06: 4 mg via INTRAVENOUS
  Filled 2022-04-06: qty 2

## 2022-04-06 MED ORDER — LORAZEPAM 1 MG PO TABS: 1.0000 mg | ORAL_TABLET | ORAL | Status: DC | PRN

## 2022-04-06 MED ORDER — FOLIC ACID 1 MG PO TABS
1.0000 mg | ORAL_TABLET | Freq: Every day | ORAL | Status: DC
  Filled 2022-04-06 (×3): qty 1

## 2022-04-06 MED ORDER — LACTATED RINGERS IV SOLN: INTRAVENOUS | Status: DC

## 2022-04-06 MED ORDER — ONDANSETRON 4 MG PO TBDP
8.0000 mg | ORAL_TABLET | Freq: Once | ORAL | Status: AC
  Administered 2022-04-06: 8 mg via ORAL
  Filled 2022-04-06: qty 2

## 2022-04-06 MED ORDER — LABETALOL HCL 5 MG/ML IV SOLN
10.0000 mg | INTRAVENOUS | Status: DC | PRN
  Administered 2022-04-06 – 2022-04-08 (×6): 10 mg via INTRAVENOUS
  Filled 2022-04-06 (×7): qty 4

## 2022-04-06 NOTE — Discharge Instructions (Addendum)
Outpatient Substance Abuse  Treatment- uninsured  Narcotics Anonymous 24-HOUR HELPLINE Pre-recorded for Meeting Schedules [facility] [phone]  [email or web address] ALCOHOLICS ANONYMOUS  [facility]  Answering Service [phone] Please Note: All [place] Meetings are Non-smoking [email or web address].es  Alcohol and Drug Services -  Insurance: Medicaid /State funding/private insurance Methadone, suboxone/Intensive outpatient   [phone] Fax: [phone] [address] [place], [place], [address] [place] [phone] Fax: [phone]    [address] [place], [place], [address] ([address] [place], [place], [place], [place], [place], [place], [place], [place]) [facility] [email or web address] Accepts State funding/Medicaid Transitional housing, Intensive Outpatient Treatment, Outpatient treatment, Veterans Services  Phone: [phone] Fax: [phone] Address: [address] [place] [address]   [employer] [facility] ([email or web address]/) Insurance: Medicaid Case Management, Administrator, arts, Medication Management, Outpatient Therapy, Psychosocial Rehabilitation, Substance Abuse Intensive Outpatient  Phone: [phone] Fax: [phone] [address], [place], [place], [address]  [facility] Medicaid, most private insurance providers Types of Program: Individual/Group Therapy, Substance Abuse Treatment  Phone: Virgie [phone] Fax: [phone] [address], [place], [place], [address] [place] [phone] [address], [address] [place], [place], [address]  [facility]  Medicaid Types of Program: SAIOP  Phone: [phone] Fax: [phone] [address] [place], [place], [place], [address] [facility] Medicaid/state funds Crisis line [phone] [facility] [phone] [place] [phone] [place] [phone]  [facility] [address];  [place], [place] [address] [phone]  Substance Abuse Intensive Outpatient Program [facility] [address] [place], [place] [address] [phone]- Substance abuse treatment  Successful Transitions  Insurance: [facility], [address], sliding scale Types of Program: substance abuse treatment, transportation assistance Phone: [phone] Fax: [phone] Address: [address], [place] [address] [facility] ([email or web address]) Insurance: UHC, [place], [place], [place] Program: addiction counseling, detoxification,  Phone: [phone]  Fax: [phone] Address: [address] [place], [place] [address]  [facility] (statewide facilities/programs) [address] (Medicaid/state funds) [place], [place] [address]                      [email or web address]/ [phone] Marcy Panning- [phone] [place]- [phone] [facility] [place] (2 Locations) (Medicaid/state funds) --[address]  walk in 8:30-12 and 1-2:30 [place], [id number]   [facility]- [phone] --[address] [place], [place] [address]  [phone] walk in 8:30-12 and 2-3:30  [facility] state funds/medicaid [address] [place], [place] [address] [phone] [facility] (Suboxone clinic) Medicaid/state funds  [address]  [place], [place] [address] [phone]   [facility]  [address], [place], [place] [address]  [phone] (24 hours) [place]- [address] [place], [place] [address]  [phone] (24 hours) [place]- [address] [place] [phone] - [address] Rosalita Levan [phone] Turner Daniels [address] [place] [phone] [facility]- Medicaid and state funds  [place]- [address] [place], [place] [address] [phone] (24 hours) [place]- [address] [place], [place]  [address] [phone] [facility] [address] [place], [place] [address] [phone] (24 hours) [place] [address] [place], [place]  [address] [phone] [place]- [address] [place] [phone]  You have chronic kidney disease  which needs to be followed by your primary care physician Please try to stop smoking and drinking completely Please take a low-fat diet Your lisinopril has been replaced by amlodipine to help protect your kidneys  Please review all of you discharge paperwork on the day of discharge and be sure you have all of your prescribed medications.  Please request your Primary MD to go over all Hospital Tests and Procedure/Radiological results at the follow up Please get all Hospital records sent to your primary MD by signing hospital release before you go home.   In some cases, there will be blood work, cultures and biopsy results pending at the time of your discharge. Please request that your primary care M.D. goes through all the records of your hospital data and follows up on these results.  Please take all your medications with you for your next visit with your Primary MD   Please request your Primary MD to go over all hospital tests and procedure/radiological results at the follow up, please ask your Primary MD to get all Hospital records sent to his/her office.   You must read complete instructions/literature along with all the possible adverse reactions/side effects for all the Medicines you take and that have been prescribed to you. Take any new Medicines after you have completely understood and accpet all the possible adverse reactions/side effects.    Do not drive or operate heavy machinery when taking Pain medications.    Do not take more than prescribed Pain, Sleep and Anxiety Medications  If you have smoked or chewed Tobacco  in the last 2 yrs please stop smoking, stop any regular Alcohol  and or any Recreational drug use.   Wear Seat belts  while driving.   If you had Pneumonia or Lung problems at the Hospital: Please get a 2 view Chest X ray done in 6-8 weeks after hospital discharge or sooner if instructed by your Primary MD.   If you have Congestive Heart Failure: Please call your Cardiologist or Primary MD anytime you have any of the following symptoms:  1) 3 pound weight gain in 24 hours or 5 pounds in 1 week  2) shortness of breath, with or without a dry hacking cough  3) swelling in the hands, feet or stomach  4) if you have to sleep on extra pillows at night in order to breathe 5) Follow cardiac low salt diet and 1.5 lit/day fluid restriction.   If you have Diabetes Accuchecks 4 times/day- once on AM empty stomach and then before each meal. Log in all results and show them to your primary doctor at your next visit. If any glucose reading is under 60 or above 400 call your primary MD immediately.   If you have Seizure/Convulsions/Epilepsy: Please do not drive, operate heavy machinery, participate in activities at heights or participate in high speed sports until you have seen by Primary MD or a Neurologist and advised to do so again. Per [facility] statutes, patients with seizures are not allowed to drive until they have been seizure-free for six months.  Use caution when using heavy equipment or power tools. Avoid working on ladders or at heights. Take showers instead of baths. Ensure the water temperature is not too high on the home water heater. Do not go swimming alone. Do not lock yourself in a room alone (i.e. bathroom). When caring for infants or small children, sit down when holding, feeding, or changing them to minimize risk of injury to the  child in the event you have a seizure. Maintain good sleep hygiene. Avoid alcohol.    If you had Gastrointestinal Bleeding: Please ask your Primary MD to check a complete blood count within one week of discharge or at your next visit. Your endoscopic/colonoscopic  biopsies that are pending at the time of discharge, will also need to followed by your Primary MD.  Please note You were cared for by a hospitalist during your hospital stay. If you have any questions about your discharge medications or the care you received while you were in the hospital after you are discharged, you can call the unit and asked to speak with the hospitalist on call if the hospitalist that took care of you is not available. Once you are discharged, your primary care physician will handle any further medical issues. Please note that NO REFILLS for any discharge medications will be authorized once you are discharged, as it is imperative that you return to your primary care physician (or establish a relationship with a primary care physician if you do not have one) for your aftercare needs so that they can reassess your need for medications and monitor your lab values.   You can reach the hospitalist office at phone [phone] or fax [phone]   If you do not have a primary care physician, you can call [phone] for a physician referral.

## 2022-04-06 NOTE — ED Provider Notes (Signed)
[facility] EMERGENCY DEPARTMENT Provider Note   CSN: [id number] Arrival date & time: 04/06/22  1115     History  Chief Complaint  Patient presents with   Abdominal Pain    BELMIRA DALEY is a 61 y.o. female.  The history is provided by the patient and the spouse.  Abdominal Pain 61 year old female past medical history alcohol use disorder, tobacco use, CKD who is presenting with epigastric pain.  Husband at bedside, contributed history.  They report that patient was awoke last night with epigastric abdominal pain.  She initially was able to sleep, and the belly pain had improved this morning.  However, throughout the day, the abdominal pain recurred, coupled with nausea, vomiting.  Patient has not been able to tolerate anything p.o. today.  She denies any fevers, cough, congestion, chest pain, difficulty breathing, diarrhea.  She reports daily alcohol use, drinking 1 pint of liquor daily. She reports that when she skips drinking her usual amount she will get the "shakes", but denies any history of seizures, hallucinations, hospitalizations related to alcohol withdrawal.     Home Medications Prior to Admission medications   Medication Sig Start Date End Date Taking? Authorizing Provider  ALBUTEROL IN Inhale 1-2 puffs into the lungs as needed.    [provider]  lisinopril (ZESTRIL) 5 MG tablet Take 1 tablet (5 mg total) by mouth daily. Patient not taking: Reported on 04/25/2021 04/23/20   Wynn Banker, MD  pantoprazole (PROTONIX) 40 MG tablet Take 1 tablet (40 mg total) by mouth 2 (two) times daily for 14 days. Patient not taking: Reported on 08/10/2021 04/25/21 05/09/21  Daiva Eves, Lisette Grinder, MD  promethazine (PHENERGAN) 25 MG tablet Take 1 tablet (25 mg total) by mouth every 6 (six) hours as needed for nausea or vomiting. Take one tablet one hour prior to taking antibiotics and then as needed. May cause sedation and may need to avoid driving while on  this medicine esp w first doses Patient not taking: Reported on 08/10/2021 05/11/21   Daiva Eves, Lisette Grinder, MD      Allergies    Percocet [oxycodone-acetaminophen] and Azithromycin    Review of Systems   Review of Systems  Gastrointestinal:  Positive for abdominal pain.    Physical Exam Updated Vital Signs BP (!) 193/116   Pulse 85   Temp 98.6 F (37 C)   Resp 18   SpO2 99%  Physical Exam Vitals and nursing note reviewed.  Constitutional:      General: She is not in acute distress.    Appearance: Normal appearance. She is well-developed. She is ill-appearing. She is not diaphoretic.     Comments: Uncomfortable appearing female, actively vomiting.  HENT:     Head: Normocephalic and atraumatic.     Right Ear: External ear normal.     Left Ear: External ear normal.     Nose: Nose normal.     Mouth/Throat:     Mouth: Mucous membranes are moist.  Cardiovascular:     Rate and Rhythm: Normal rate and regular rhythm.     Heart sounds: No murmur heard. Pulmonary:     Effort: Pulmonary effort is normal. No respiratory distress.     Breath sounds: Normal breath sounds.  Abdominal:     General: There is no distension.     Palpations: Abdomen is soft.     Tenderness: There is abdominal tenderness in the epigastric area. There is no guarding.  Musculoskeletal:  Cervical back: Neck supple.     Right lower leg: No edema.     Left lower leg: No edema.  Skin:    General: Skin is warm and dry.  Neurological:     Mental Status: She is alert.     ED Results / Procedures / Treatments   Labs (all labs ordered are listed, but only abnormal results are displayed) Labs Reviewed  CBC WITH DIFFERENTIAL/PLATELET - Abnormal; Notable for the following components:      Result Value   WBC 11.0 (*)    RBC 3.09 (*)    Hemoglobin 11.9 (*)    HCT 33.6 (*)    MCV 108.7 (*)    MCH 38.5 (*)    Neutro Abs 8.4 (*)    All other components within normal limits  COMPREHENSIVE METABOLIC PANEL -  Abnormal; Notable for the following components:   CO2 18 (*)    Glucose, Bld 127 (*)    Creatinine, Ser 2.04 (*)    AST 62 (*)    Total Bilirubin 1.3 (*)    GFR, Estimated 27 (*)    Anion gap 16 (*)    All other components within normal limits  LIPASE, BLOOD - Abnormal; Notable for the following components:   Lipase 209 (*)    All other components within normal limits  URINALYSIS, ROUTINE W REFLEX MICROSCOPIC - Abnormal; Notable for the following components:   APPearance HAZY (*)    Hgb urine dipstick SMALL (*)    Protein, ur 100 (*)    Leukocytes,Ua MODERATE (*)    Bacteria, UA RARE (*)    All other components within normal limits    EKG None  Radiology CT ABDOMEN PELVIS WO CONTRAST  Result Date: 04/06/2022 CLINICAL DATA:  Acute abdominal pain since last evening. EXAM: CT ABDOMEN AND PELVIS WITHOUT CONTRAST TECHNIQUE: Multidetector CT imaging of the abdomen and pelvis was performed following the standard protocol without IV contrast. RADIATION DOSE REDUCTION: This exam was performed according to the departmental dose-optimization program which includes automated exposure control, adjustment of the mA and/or kV according to patient size and/or use of iterative reconstruction technique. COMPARISON:  CT scan 08/16/2016 FINDINGS: Lower chest: The lung bases are clear of acute process. No pleural effusion or pulmonary lesions. The heart is normal in size. No pericardial effusion. Age advanced aortic and coronary artery calcifications. The distal esophagus and aorta are unremarkable. Hepatobiliary: No hepatic lesions are identified without contrast. No intrahepatic biliary dilatation. The gallbladder is grossly normal. No common bile duct dilatation. Pancreas: Mild diffuse inflammation involving the pancreas and peripancreatic tissues consistent with mild acute pancreatitis. No significant fluid collections. Spleen: Normal size.  No focal lesions. Adrenals/Urinary Tract: Adrenal glands and  kidneys are unremarkable. The bladder is unremarkable. Stomach/Bowel: Stomach, duodenum, small bowel and colon are grossly normal. No acute inflammatory process, mass lesions or obstructive findings. The terminal ileum and appendix are normal. Vascular/Lymphatic: The aorta is normal in caliber. Moderate age advanced atheroscerlotic calcifications. No mesenteric of retroperitoneal mass or adenopathy. Small scattered lymph nodes are noted. Reproductive: The uterus and ovaries are unremarkable. Other: No pelvic mass or adenopathy. No free pelvic fluid collections. No inguinal mass or adenopathy. No abdominal wall hernia or subcutaneous lesions. Musculoskeletal: No acute bony findings. Moderate degenerative disc disease noted at L5-S1. IMPRESSION: 1. CT findings consistent with mild acute uncomplicated pancreatitis. However, examination is limited without IV contrast. 2. No other significant abdominal/pelvic findings, mass lesions or adenopathy. 3. Age advanced vascular calcifications.  Aortic Atherosclerosis (ICD10-I70.0). Electronically Signed   By: Rudie Meyer M.D.   On: 04/06/2022 13:40    Procedures Procedures   Medications Ordered in ED Medications  morphine (PF) 4 MG/ML injection 4 mg (has no administration in time range)  lactated ringers bolus 1,000 mL (has no administration in time range)  ondansetron (ZOFRAN) injection 4 mg (has no administration in time range)  HYDROcodone-acetaminophen (NORCO/VICODIN) 5-325 MG per tablet 1 tablet (1 tablet Oral Given 04/06/22 1138)  ondansetron (ZOFRAN-ODT) disintegrating tablet 8 mg (8 mg Oral Given 04/06/22 1139)    ED Course/ Medical Decision Making/ A&P                           Medical Decision Making Risk Prescription drug management. Decision regarding hospitalization.   KIYARA BOUFFARD is a 61 year old female past medical history notable for alcohol use disorder, tobacco use, CKD who is presenting with epigastric pain.   Vitals at  presentation within normal limits.  Patient is hemodynamically stable, afebrile, satting well on room air.  Physical exam notable for epigastric tenderness to palpation without evidence of rebound or guarding.  Normal heart sounds.  Lungs clear to auscultation bilaterally.   Lab work obtained, resulted notable for lipase 209, CBC with leukocytosis of 11, hemoglobin 11.9, consistent with patient's known history of CKD. CMP with elevated Cr, and anion gap of 16, potentially due to dehydration in setting of nausea and vomiting.    Imaging was pursued, notable for findings consistent with mild acute uncomplicated pancreatitis per radiology preliminary read.  Patient with significant history of alcohol use and reports tremors upon cessation of alcohol intake.  Patient placed on CIWA protocol while in the ED. Patient also provided IV fluids, IV analgesia (morphine), and IV zofran.  Patient is unable to tolerate p.o. intake. We will admit the patient to medicine for IV fluids, analgesia, management of her pancreatitis.  The plan for this patient was discussed with Dr. Criss Alvine, who voiced agreement and who oversaw evaluation and treatment of this patient.   Final Clinical Impression(s) / ED Diagnoses Final diagnoses:  Acute pancreatitis, unspecified complication status, unspecified pancreatitis type    Rx / DC Orders ED Discharge Orders     None         Skeet Simmer, MD 04/06/22 2329    Pricilla Loveless, MD 04/06/22 2350

## 2022-04-06 NOTE — ED Triage Notes (Signed)
Patient w/ epigastric abdominal pain that started last night, pain was so severe that she was unable to sleep. Patient complains of n/v. Patient seen writhing in triage.
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
## 2022-04-06 NOTE — ED Provider Triage Note (Signed)
Emergency Medicine Provider Triage Evaluation Note  Kelsey Poole , a 61 y.o. female  was evaluated in triage.  Pt complains of abdominal pain.  Patient states that symptoms began last night.  She complains of pain in the epigastric region.  She notes drinking alcohol before symptom onset.  She denies symptoms similar in the past.  Previous abdominal surgeries include cesarean section.  Notes associated vomiting and nausea.  Denies hematemesis, melena, hematochezia, chest pain, shortness of breath, urinary/vaginal symptoms, change in bowel habits, fever..  Review of Systems  Positive: See above Negative:   Physical Exam  BP (!) 186/152   Pulse (!) 147   Temp 98.3 F (36.8 C)   Resp (!) 22   SpO2 100%  Gen:   Awake, no distress   Resp:  Normal effort  MSK:   Moves extremities without difficulty  Other:  Epigastric tenderness to palpation.  Lungs clear to auscultation.  No CVA tenderness bilaterally.  Medical Decision Making  Medically screening exam initiated at 11:28 AM.  Appropriate orders placed.  Kelsey Poole was informed that the remainder of the evaluation will be completed by another provider, this initial triage assessment does not replace that evaluation, and the importance of remaining in the ED until their evaluation is complete.     Peter Garter, [place] 04/06/22 [phone]

## 2022-04-06 NOTE — ED Notes (Signed)
Patient refusing to take PO medications at this time stating that she is too nauseous. Will document refused and let next assigned RN know of same.

## 2022-04-07 DIAGNOSIS — N1832 Chronic kidney disease, stage 3b: Secondary | ICD-10-CM | POA: Diagnosis not present

## 2022-04-07 DIAGNOSIS — I160 Hypertensive urgency: Secondary | ICD-10-CM | POA: Diagnosis not present

## 2022-04-07 DIAGNOSIS — K859 Acute pancreatitis without necrosis or infection, unspecified: Secondary | ICD-10-CM | POA: Diagnosis not present

## 2022-04-07 LAB — BASIC METABOLIC PANEL
Anion gap: 11 (ref 5–15)
BUN: 9 mg/dL (ref 8–23)
CO2: 24 mmol/L (ref 22–32)
Calcium: 8.7 mg/dL — ABNORMAL LOW (ref 8.9–10.3)
Chloride: 102 mmol/L (ref 98–111)
Creatinine, Ser: 1.74 mg/dL — ABNORMAL HIGH (ref 0.44–1.00)
GFR, Estimated: 33 mL/min — ABNORMAL LOW (ref >60)
Glucose, Bld: 95 mg/dL (ref 70–99)
Potassium: 3.5 mmol/L (ref 3.5–5.1)
Sodium: 137 mmol/L (ref 135–145)

## 2022-04-07 LAB — HEPATIC FUNCTION PANEL
ALT: 22 U/L (ref 0–44)
AST: 36 U/L (ref 15–41)
Albumin: 3.5 g/dL (ref 3.5–5.0)
Alkaline Phosphatase: 87 U/L (ref 38–126)
Bilirubin, Direct: 0.3 mg/dL — ABNORMAL HIGH (ref 0.0–0.2)
Indirect Bilirubin: 0.9 mg/dL (ref 0.3–0.9)
Total Bilirubin: 1.2 mg/dL (ref 0.3–1.2)
Total Protein: 5.8 g/dL — ABNORMAL LOW (ref 6.5–8.1)

## 2022-04-07 LAB — CBC WITH DIFFERENTIAL/PLATELET
Abs Immature Granulocytes: 0.02 10*3/uL (ref 0.00–0.07)
Basophils Absolute: 0.1 10*3/uL (ref 0.0–0.1)
Basophils Relative: 1 %
Eosinophils Absolute: 0.2 10*3/uL (ref 0.0–0.5)
Eosinophils Relative: 3 %
HCT: 26.9 % — ABNORMAL LOW (ref 36.0–46.0)
Hemoglobin: 9.8 g/dL — ABNORMAL LOW (ref 12.0–15.0)
Immature Granulocytes: 0 %
Lymphocytes Relative: 24 %
Lymphs Abs: 1.8 10*3/uL (ref 0.7–4.0)
MCH: 38.9 pg — ABNORMAL HIGH (ref 26.0–34.0)
MCHC: 36.4 g/dL — ABNORMAL HIGH (ref 30.0–36.0)
MCV: 106.7 fL — ABNORMAL HIGH (ref 80.0–100.0)
Monocytes Absolute: 0.5 10*3/uL (ref 0.1–1.0)
Monocytes Relative: 6 %
Neutro Abs: 4.9 10*3/uL (ref 1.7–7.7)
Neutrophils Relative %: 66 %
Platelets: 231 10*3/uL (ref 150–400)
RBC: 2.52 MIL/uL — ABNORMAL LOW (ref 3.87–5.11)
RDW: 12.9 % (ref 11.5–15.5)
WBC: 7.5 10*3/uL (ref 4.0–10.5)
nRBC: 0 % (ref 0.0–0.2)

## 2022-04-07 LAB — TRIGLYCERIDES: Triglycerides: 69 mg/dL (ref <150)

## 2022-04-07 LAB — TROPONIN I (HIGH SENSITIVITY)
Troponin I (High Sensitivity): 13 ng/L (ref <18)
Troponin I (High Sensitivity): 10 ng/L (ref <18)

## 2022-04-07 LAB — HIV ANTIBODY (ROUTINE TESTING W REFLEX): HIV Screen 4th Generation wRfx: NONREACTIVE

## 2022-04-07 MED ORDER — AMLODIPINE BESYLATE 10 MG PO TABS
10.0000 mg | ORAL_TABLET | Freq: Every day | ORAL | Status: DC
  Administered 2022-04-07 – 2022-04-08 (×2): 10 mg via ORAL
  Filled 2022-04-07 (×2): qty 1

## 2022-04-07 MED ORDER — LACTATED RINGERS IV SOLN: INTRAVENOUS | Status: DC

## 2022-04-07 MED ORDER — ASPIRIN 325 MG PO TABS: 325.0000 mg | ORAL_TABLET | Freq: Once | ORAL | Status: DC

## 2022-04-07 MED ORDER — BOOST / RESOURCE BREEZE PO LIQD CUSTOM
1.0000 | Freq: Three times a day (TID) | ORAL | Status: DC
  Administered 2022-04-07 – 2022-04-08 (×2): 1 via ORAL

## 2022-04-07 NOTE — Progress Notes (Addendum)
[facility] Progress Note  Patient: Kelsey Poole     RWE:[id number]  DOA: 04/06/2022   PCP: Wynn Banker, MD (Inactive)       Brief hospital course: This is a 61 year old female who presents to the hospital for upper abdominal pain. Last week she had lower abdominal/pelvic pain which she felt like was related to her uterus.  She took some leftover amoxicillin for this. 2 days prior to admission she started having upper abdominal pain with nausea and vomiting. In the ED she was found to have acute pancreatitis based on labs and CT imaging. She admits to drinking alcohol and drinks about a pint of hard liquor which she mixes with water.  She is also noted to be hypertensive with systolic blood pressures in the 180s and 190s and diastolics in the 100s.  She states normally her blood pressure is low when she checks it with a BP cuff at home but she is not sure if it is calibrated correctly.  She states she has no history of hypertension being diagnosed. Subjective:  Abdominal pain has resolved and she is anxious to start eating.  She has no nausea no vomiting and no diarrhea. No other complaints. Assessment and Plan: Principal Problem:   Acute pancreatitis-likely related to alcohol abuse - Reduce lactated Ringer's 200 cc/h -Start clear liquids and advance as tolerated  Active Problems: Hypertension - Interestingly she says she has no prior history of this - She has no complaints of associated symptoms and is no longer in pain by BP still hight - No recent drug use and specifically no cocaine use - We will decrease fluid rate from 200 to 100 cc/h - Start 10 mg of Norvasc Poole and continue as needed labetalol    Alcohol abuse - Counseled to discontinue - No signs of alcohol withdrawal noted - Social work consulted to give her resources to help her quit - Her significant other who is at bedside also drinks heavily and says he has had severe pancreatitis in the past  related to alcohol    CKD (chronic kidney disease) stage 3 B -She states she is not aware of having any chronic kidney disease that she knows of -Creatinine was 2.04 when admitted and has improved to 1.74 - Baseline creatinine appears to be about 1.3-1.8 - Follow intermittently  Nicotine abuse - The patient says she is down to about 4 cigarettes a day - CT of the lung on 5/25 for lung cancer screening showed mild emphysema and no nodules   DVT prophylaxis:  heparin injection 5,000 Units Start: 04/06/22 2200     Code Status: Full Code  Consultants: None Level of Care: Level of care: Telemetry Medical Disposition Plan:  Status is: Inpatient Remains inpatient appropriate because: Acute pancreatitis  Objective:   Vitals:   04/07/22 0940 04/07/22 1121 04/07/22 1148 04/07/22 1307  BP: (!) 185/101 (!) 184/103 (!) 182/104 (!) 182/104  Pulse: 66 66    Resp: 16     Temp: 97.9 F (36.6 C)     TempSrc: Oral     SpO2: 100%     Weight:      Height:       Filed Weights   04/06/22 2358  Weight: 71.2 kg   Exam: General exam: Appears comfortable  HEENT: PERRLA, oral mucosa moist, no sclera icterus or thrush Respiratory system: Clear to auscultation. Respiratory effort normal. Cardiovascular system: S1 & S2 heard, regular rate and rhythm Gastrointestinal system: Abdomen soft, non-tender,  nondistended. Normal bowel sounds   Central nervous system: Alert and oriented. No focal neurological deficits. Extremities: No cyanosis, clubbing or edema Skin: No rashes or ulcers Psychiatry:  Mood & affect appropriate.    Imaging and lab data was personally reviewed    CBC: Recent Labs  Lab 04/06/22 1130 04/07/22 1038  WBC 11.0* 7.5  NEUTROABS 8.4* 4.9  HGB 11.9* 9.8*  HCT 33.6* 26.9*  MCV 108.7* 106.7*  PLT 351 231   Basic Metabolic Panel: Recent Labs  Lab 04/06/22 1130 04/07/22 1038  NA 139 137  K 3.7 3.5  CL 105 102  CO2 18* 24  GLUCOSE 127* 95  BUN 15 9  CREATININE  2.04* 1.74*  CALCIUM 9.7 8.7*   GFR: Estimated Creatinine Clearance: 37.9 mL/min (A) (by C-G formula based on SCr of 1.74 mg/dL (H)).  Scheduled Meds:  amLODipine  10 mg Oral Poole   folic acid  1 mg Oral Poole   heparin  5,000 Units Subcutaneous Q8H   LORazepam  0-4 mg Intravenous Q6H   Followed by   Melene Muller ON 04/08/2022] LORazepam  0-4 mg Intravenous Q12H   multivitamin with minerals  1 tablet Oral Poole   thiamine  100 mg Oral Poole   Or   thiamine  100 mg Intravenous Poole   Continuous Infusions:  lactated ringers 200 mL/hr at 04/07/22 0952     LOS: 1 day   Author: Calvert Cantor  04/07/2022 1:14 PM
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
## 2022-04-07 NOTE — Progress Notes (Signed)
Initial Nutrition Assessment  DOCUMENTATION CODES:   Not applicable  INTERVENTION:   Boost Breeze po TID, each supplement provides 250 kcal and 9 grams of protein  MVI po daily  Pt at high refeed risk; recommend monitor potassium, magnesium and phosphorus labs daily until stable  NUTRITION DIAGNOSIS:   Inadequate oral intake related to acute illness as evidenced by other (comment) (pt on clear liquid diet).  GOAL:   Patient will meet greater than or equal to 90% of their needs  MONITOR:   PO intake, Supplement acceptance, Labs, Weight trends, Skin, I & O's  REASON FOR ASSESSMENT:   Malnutrition Screening Tool    ASSESSMENT:   61 y/o female with h/o etoh abuse, substance abuse (cocaine and marijuana), H. Pylori gastritis, HTN, CKD III, emphysema and recent tooth abscess who is admitted with acute pancreatitis.  RD working remotely.  Spoke with pt via phone. Pt reports good appetite and oral intake at baseline but reports decreased oral intake for one day pta r/t abdominal pain and nausea. Pt reports her appetite is returned today. Pt initiated on a clear liquid diet and reports eating 100% of meals in hospital. Pt is anxious to be advanced to a regular diet. Pt reports that she does drink Ensure and Boost sometimes at home but reports that it upsets her stomach. RD discussed with pt with importance of adequate nutrition needed to preserve lean muscle. RD also discussed pancreatitis diet recommendations with pt. Pt would like to try peach Boost Breeze in hospital; pt does not want Ensure. Pt is at high refeed risk. Per chart, pt appears weight stable at baseline; pt denies any recent weight loss. RD will follow up to obtain nutrition related exam. Pt is at high risk for malnutrition.   Medications reviewed and include: folic acid, heparin, MVI, thiamine, LRS @100ml /hr  Labs reviewed: K 3.5 wnl, creat 1.74(H) Lipase- 209(H)- 8/3 Hgb 9.8(L), Hct 26.9(L), MCV 106.7(H), MCH  38.9(H), MCHC 36.4(H)  NUTRITION - FOCUSED PHYSICAL EXAM: Unable to perform at this time   Diet Order:   Diet Order             Diet clear liquid Room service appropriate? Yes; Fluid consistency: Thin  Diet effective now                  EDUCATION NEEDS:   No education needs have been identified at this time  Skin:  Skin Assessment: Reviewed RN Assessment  Last BM:  pta  Height:   Ht Readings from Last 1 Encounters:  04/06/22 5\' 11"  (1.803 m)    Weight:   Wt Readings from Last 1 Encounters:  04/06/22 71.2 kg    Ideal Body Weight:  70.4 kg  BMI:  Body mass index is 21.89 kg/m.  Estimated Nutritional Needs:   Kcal:  1700-1900kcal/day  Protein:  85-95g/day  Fluid:  2.1-2.4L/day  MS, RD, LDN Please refer to [facility] for RD and/or RD on-call/weekend/after hours pager

## 2022-04-07 NOTE — Progress Notes (Signed)
NEW ADMISSION NOTE New Admission Note:   Arrival Method: Stretcher via ED Mental Orientation: Alert and Oriented x4 Telemetry: Tele Box #4 Assessment: Initiated Skin: Intact IV: Right Forearm Pain: 0/10 Tubes: None Safety Measures: Safety Fall Prevention Plan has been given, discussed and signed Admission: Initiated  [facility] [place] Orientation: Patient has been orientated to the room, unit and staff.  Family: Not present at the bedside.   Orders have been reviewed and implemented. Will continue to monitor the patient. Call light has been placed within reach and bed alarm has been activated.   Daneil Dan, RN

## 2022-04-08 DIAGNOSIS — K859 Acute pancreatitis without necrosis or infection, unspecified: Secondary | ICD-10-CM | POA: Diagnosis not present

## 2022-04-08 DIAGNOSIS — F1010 Alcohol abuse, uncomplicated: Secondary | ICD-10-CM

## 2022-04-08 DIAGNOSIS — I160 Hypertensive urgency: Secondary | ICD-10-CM | POA: Diagnosis not present

## 2022-04-08 DIAGNOSIS — N1832 Chronic kidney disease, stage 3b: Secondary | ICD-10-CM | POA: Diagnosis not present

## 2022-04-08 LAB — POTASSIUM: Potassium: 3.4 mmol/L — ABNORMAL LOW (ref 3.5–5.1)

## 2022-04-08 LAB — PHOSPHORUS: Phosphorus: 3.9 mg/dL (ref 2.5–4.6)

## 2022-04-08 LAB — MAGNESIUM: Magnesium: 0.8 mg/dL — CL (ref 1.7–2.4)

## 2022-04-08 MED ORDER — MAGNESIUM SULFATE 2 GM/50ML IV SOLN
2.0000 g | Freq: Once | INTRAVENOUS | Status: AC
  Administered 2022-04-08: 2 g via INTRAVENOUS
  Filled 2022-04-08: qty 50

## 2022-04-08 MED ORDER — FOLIC ACID 1 MG PO TABS: 1.0000 mg | ORAL_TABLET | Freq: Every day | ORAL | 0 refills | Status: AC

## 2022-04-08 MED ORDER — THIAMINE HCL 100 MG PO TABS: 100.0000 mg | ORAL_TABLET | Freq: Every day | ORAL | 0 refills | Status: AC

## 2022-04-08 MED ORDER — POTASSIUM CHLORIDE CRYS ER 20 MEQ PO TBCR: 40.0000 meq | EXTENDED_RELEASE_TABLET | Freq: Once | ORAL | Status: DC

## 2022-04-08 MED ORDER — ONDANSETRON HCL 4 MG/2ML IJ SOLN
4.0000 mg | Freq: Four times a day (QID) | INTRAMUSCULAR | Status: DC | PRN
  Administered 2022-04-08: 4 mg via INTRAVENOUS
  Filled 2022-04-08: qty 2

## 2022-04-08 MED ORDER — POTASSIUM CHLORIDE CRYS ER 20 MEQ PO TBCR
20.0000 meq | EXTENDED_RELEASE_TABLET | Freq: Once | ORAL | Status: AC
  Administered 2022-04-08: 20 meq via ORAL
  Filled 2022-04-08: qty 1

## 2022-04-08 MED ORDER — ADULT MULTIVITAMIN W/MINERALS CH: 1.0000 | ORAL_TABLET | Freq: Every day | ORAL | 0 refills | Status: AC

## 2022-04-08 MED ORDER — AMLODIPINE BESYLATE 10 MG PO TABS: 10.0000 mg | ORAL_TABLET | Freq: Every day | ORAL | 0 refills | Status: DC

## 2022-04-08 NOTE — Discharge Summary (Signed)
Physician Discharge Summary  Kelsey Poole [id number] DOB: 05-Nov-1960 DOA: 04/06/2022  PCP: Laurey Morale, MD  Admit date: 04/06/2022 Discharge date: 04/08/2022 Discharging to: Home Recommendations for Outpatient Follow-up:  Continue to encourage alcohol cessation Follow CKD as outpatient  Consults:  None Procedures:  None   Discharge Diagnoses:   Principal Problem:   Acute pancreatitis Active Problems:   Alcohol abuse   Stage 3b chronic kidney disease (CKD) [facility])   Hypertensive urgency     Hospital Course:  This is a 61 year old female who presents to the hospital for upper abdominal pain. Last week she had lower abdominal/pelvic pain which she felt like was related to her uterus.  She took some leftover amoxicillin for this. 2 days prior to admission she started having upper abdominal pain with nausea and vomiting. In the ED she was found to have acute pancreatitis based on labs and CT imaging. She admits to drinking alcohol and drinks about a pint of hard liquor which she mixes with water.   She is also noted to be hypertensive with systolic blood pressures in the 180s and [id number] and diastolics in the [id number].  She states normally her blood pressure is low when she checks it with a BP cuff at home but she is not sure if it is calibrated correctly.  She states she has no history of hypertension being diagnosed.  Principal Problem:   Acute pancreatitis-likely related to alcohol abuse - This resolved with conservative management - She is tolerating a low-fat diet  Active Problems: Hypertensive urgency - Interestingly she says she apically has a mildly low BP -It was elevated on 8/4-and was high as 189/108-she had no complaints of associated symptoms and is no longer was pain  - No recent drug use and specifically no cocaine use - We will decrease fluid rate from 200 to 100 cc/h -Treated with as needed labetalol IV - Started 10 mg of Norvasc daily as creatinine was still  elevated - Can continue amlodipine instead of lisinopril      Alcohol abuse - Counseled to discontinue - No signs of alcohol withdrawal noted - Social work consulted to give her resources to help her quit - Her significant other who is at bedside also drinks heavily and says he has had severe pancreatitis in the past related to alcohol     CKD (chronic kidney disease) stage 3 B -She states she is not aware of having any chronic kidney disease that she knows of -Creatinine was 2.04 when admitted and has improved to 1.74- Baseline creatinine appears to be about 1.3-1.8    Nicotine abuse - The patient says she is down to about 4 cigarettes a day - CT of the lung on 5/25 for lung cancer screening showed mild emphysema and no nodules  -Have counseled her on quitting      Discharge Instructions  Discharge Instructions     Diet - low sodium heart healthy   Complete by: As directed    Increase activity slowly   Complete by: As directed       Allergies as of 04/08/2022       Reactions   Percocet [oxycodone-acetaminophen] Itching   Z-pak [azithromycin] Hives        Medication List     STOP taking these medications    lisinopril 5 MG tablet Commonly known as: ZESTRIL       TAKE these medications    acetaminophen 325 MG tablet Commonly known as: TYLENOL Take 650  mg by mouth daily as needed for headache.   albuterol 108 (90 Base) MCG/ACT inhaler Commonly known as: VENTOLIN HFA Inhale 2 puffs into the lungs daily as needed for wheezing or shortness of breath.   amLODipine 10 MG tablet Commonly known as: NORVASC Take 1 tablet (10 mg total) by mouth daily. Start taking on: April 09, 2022   Claritin 10 MG tablet Generic drug: loratadine Take 10 mg by mouth daily.   folic acid 1 MG tablet Commonly known as: FOLVITE Take 1 tablet (1 mg total) by mouth daily. Start taking on: April 09, 2022   multivitamin with minerals Tabs tablet Take 1 tablet by mouth  daily. Start taking on: April 09, 2022   thiamine 100 MG tablet Commonly known as: VITAMIN B1 Take 1 tablet (100 mg total) by mouth daily. Start taking on: April 09, 2022            The results of significant diagnostics from this hospitalization (including imaging, microbiology, ancillary and laboratory) are listed below for reference.    CT ABDOMEN PELVIS WO CONTRAST  Result Date: 04/06/2022 CLINICAL DATA:  Acute abdominal pain since last evening. EXAM: CT ABDOMEN AND PELVIS WITHOUT CONTRAST TECHNIQUE: Multidetector CT imaging of the abdomen and pelvis was performed following the standard protocol without IV contrast. RADIATION DOSE REDUCTION: This exam was performed according to the departmental dose-optimization program which includes automated exposure control, adjustment of the mA and/or kV according to patient size and/or use of iterative reconstruction technique. COMPARISON:  CT scan 08/16/2016 FINDINGS: Lower chest: The lung bases are clear of acute process. No pleural effusion or pulmonary lesions. The heart is normal in size. No pericardial effusion. Age advanced aortic and coronary artery calcifications. The distal esophagus and aorta are unremarkable. Hepatobiliary: No hepatic lesions are identified without contrast. No intrahepatic biliary dilatation. The gallbladder is grossly normal. No common bile duct dilatation. Pancreas: Mild diffuse inflammation involving the pancreas and peripancreatic tissues consistent with mild acute pancreatitis. No significant fluid collections. Spleen: Normal size.  No focal lesions. Adrenals/Urinary Tract: Adrenal glands and kidneys are unremarkable. The bladder is unremarkable. Stomach/Bowel: Stomach, duodenum, small bowel and colon are grossly normal. No acute inflammatory process, mass lesions or obstructive findings. The terminal ileum and appendix are normal. Vascular/Lymphatic: The aorta is normal in caliber. Moderate age advanced atheroscerlotic  calcifications. No mesenteric of retroperitoneal mass or adenopathy. Small scattered lymph nodes are noted. Reproductive: The uterus and ovaries are unremarkable. Other: No pelvic mass or adenopathy. No free pelvic fluid collections. No inguinal mass or adenopathy. No abdominal wall hernia or subcutaneous lesions. Musculoskeletal: No acute bony findings. Moderate degenerative disc disease noted at L5-S1. IMPRESSION: 1. CT findings consistent with mild acute uncomplicated pancreatitis. However, examination is limited without IV contrast. 2. No other significant abdominal/pelvic findings, mass lesions or adenopathy. 3. Age advanced vascular calcifications. Aortic Atherosclerosis (ICD10-I70.0). Electronically Signed   By: Rudie Meyer M.D.   On: 04/06/2022 13:40   Labs:   Basic Metabolic Panel: Recent Labs  Lab 04/06/22 1130 04/07/22 1038 04/08/22 0333  NA 139 137  --   K 3.7 3.5 3.4*  CL 105 102  --   CO2 18* 24  --   GLUCOSE 127* 95  --   BUN 15 9  --   CREATININE 2.04* 1.74*  --   CALCIUM 9.7 8.7*  --   MG  --   --  0.8*  PHOS  --   --  3.9  CBC: Recent Labs  Lab 04/06/22 1130 04/07/22 1038  WBC 11.0* 7.5  NEUTROABS 8.4* 4.9  HGB 11.9* 9.8*  HCT 33.6* 26.9*  MCV 108.7* 106.7*  PLT 351 231         SIGNED:   Calvert Cantor, MD  [facility] 04/08/2022, 4:59 PM

## 2022-04-08 NOTE — TOC CAGE-AID Note (Signed)
Transition of Care [facility]) - CAGE-AID Screening   Patient Details  Name: EULALA NEWCOMBE MRN: [id number] Date of Birth: 1961/01/28  [facility]) CM/SW Contact:    Bess Kinds, RN Phone Number: [phone] 04/08/2022, 5:12 PM   Clinical Narrative:  Spoke with patient on hospital room phone to discuss alcohol cessation. Patient confirmed her desire to stop drinking alcohol. She stated that it has been about 3 days since her last drink and she has not had any withdrawal symptoms to date. She stated that she usually drinks a pint of liquor/day. She also smokes about a half a pack of cigarettes/day. Additional stress is that her stepmother is currently dying of cancer. Discussed coping mechanisms - productive vs nonproductive. Advised of different types of community, group and individual support. Offered to provide resources on AVS - patient agreed.   CAGE-AID Screening:    Have You Ever Felt You Ought to Cut Down on Your Drinking or Drug Use?: Yes Have People Annoyed You By Critizing Your Drinking Or Drug Use?: Yes Have You Felt Bad Or Guilty About Your Drinking Or Drug Use?: Yes Have You Ever Had a Drink or Used Drugs First Thing In The Morning to Steady Your Nerves or to Get Rid of a Hangover?: Yes CAGE-AID Score: 4  Substance Abuse Education Offered: Yes  Substance abuse interventions: Patient Counseling, Transport planner

## 2022-04-08 NOTE — Progress Notes (Signed)
Patient discharged to home, AVS reviewed, IV removed and tele -box returned Patient declined assistance to the exit, needed to leave promptly to catch the city bus. Left the floor with her significant other.

## 2022-04-10 ENCOUNTER — Telehealth: Payer: Self-pay

## 2022-04-10 NOTE — Telephone Encounter (Signed)
Transition Care Management Follow-up Telephone Call Date of discharge and from where: TCM [facility] 04-08-22 Dx: Acute pancreatitis How have you been since you were released from the hospital? Doing good  Any questions or concerns? No  Items Reviewed: Did the pt receive and understand the discharge instructions provided? Yes  Medications obtained and verified? Yes  Other? No  Any new allergies since your discharge? No  Dietary orders reviewed? Yes Do you have support at home? Yes   Home Care and Equipment/Supplies: Were home health services ordered? no If so, what is the name of the agency? na  Has the agency set up a time to come to the patient's home? not applicable Were any new equipment or medical supplies ordered?  No What is the name of the medical supply agency? na Were you able to get the supplies/equipment? not applicable Do you have any questions related to the use of the equipment or supplies? No  Functional Questionnaire: (I = Independent and D = Dependent) ADLs: I  Bathing/Dressing- I  Meal Prep- I  Eating- I  Maintaining continence- I  Transferring/Ambulation- I  Managing Meds- I  Follow up appointments reviewed:  PCP Hospital f/u appt confirmed? Yes  [facility] Cl[facility] Rid[facility]  . Are transportation arrangements needed? No  If their condition worsens, is the pt aware to call PCP or go to the Emergency Dept.? Yes Was the patient provided with contact information for the PCP's office or ED? Yes Was to pt encouraged to call back with questions or concerns? Yes

## 2022-04-13 ENCOUNTER — Encounter: Payer: Self-pay | Admitting: Family Medicine

## 2022-04-13 ENCOUNTER — Ambulatory Visit (INDEPENDENT_AMBULATORY_CARE_PROVIDER_SITE_OTHER): Admitting: Family Medicine

## 2022-04-13 VITALS — BP 132/86 | HR 102 | Temp 98.7°F | Wt 151.0 lb

## 2022-04-13 DIAGNOSIS — F1010 Alcohol abuse, uncomplicated: Secondary | ICD-10-CM | POA: Diagnosis not present

## 2022-04-13 DIAGNOSIS — F1910 Other psychoactive substance abuse, uncomplicated: Secondary | ICD-10-CM

## 2022-04-13 DIAGNOSIS — K860 Alcohol-induced chronic pancreatitis: Secondary | ICD-10-CM

## 2022-04-13 DIAGNOSIS — N1832 Chronic kidney disease, stage 3b: Secondary | ICD-10-CM

## 2022-04-13 DIAGNOSIS — F322 Major depressive disorder, single episode, severe without psychotic features: Secondary | ICD-10-CM

## 2022-04-13 DIAGNOSIS — T7431XD Adult psychological abuse, confirmed, subsequent encounter: Secondary | ICD-10-CM | POA: Diagnosis not present

## 2022-04-13 DIAGNOSIS — K852 Alcohol induced acute pancreatitis without necrosis or infection: Secondary | ICD-10-CM

## 2022-04-13 DIAGNOSIS — I10 Essential (primary) hypertension: Secondary | ICD-10-CM

## 2022-04-13 LAB — CBC WITH DIFFERENTIAL/PLATELET
Basophils Absolute: 0.1 10*3/uL (ref 0.0–0.1)
Basophils Relative: 0.9 % (ref 0.0–3.0)
Eosinophils Absolute: 0.1 10*3/uL (ref 0.0–0.7)
Eosinophils Relative: 1.1 % (ref 0.0–5.0)
HCT: 31.8 % — ABNORMAL LOW (ref 36.0–46.0)
Hemoglobin: 10.8 g/dL — ABNORMAL LOW (ref 12.0–15.0)
Lymphocytes Relative: 32.8 % (ref 12.0–46.0)
Lymphs Abs: 2.4 10*3/uL (ref 0.7–4.0)
MCHC: 33.9 g/dL (ref 30.0–36.0)
MCV: 112 fl — ABNORMAL HIGH (ref 78.0–100.0)
Monocytes Absolute: 0.5 10*3/uL (ref 0.1–1.0)
Monocytes Relative: 6.8 % (ref 3.0–12.0)
Neutro Abs: 4.3 10*3/uL (ref 1.4–7.7)
Neutrophils Relative %: 58.4 % (ref 43.0–77.0)
Platelets: 281 10*3/uL (ref 150.0–400.0)
RBC: 2.84 Mil/uL — ABNORMAL LOW (ref 3.87–5.11)
RDW: 14.5 % (ref 11.5–15.5)
WBC: 7.4 10*3/uL (ref 4.0–10.5)

## 2022-04-13 LAB — BASIC METABOLIC PANEL
BUN: 11 mg/dL (ref 6–23)
CO2: 24 mEq/L (ref 19–32)
Calcium: 9 mg/dL (ref 8.4–10.5)
Chloride: 101 mEq/L (ref 96–112)
Creatinine, Ser: 1.76 mg/dL — ABNORMAL HIGH (ref 0.40–1.20)
GFR: 30.93 mL/min — ABNORMAL LOW (ref >60.00)
Glucose, Bld: 88 mg/dL (ref 70–99)
Potassium: 3.5 mEq/L (ref 3.5–5.1)
Sodium: 137 mEq/L (ref 135–145)

## 2022-04-13 LAB — LIPASE: Lipase: 46 U/L (ref 11.0–59.0)

## 2022-04-13 MED ORDER — AMLODIPINE BESYLATE 10 MG PO TABS: 10.0000 mg | ORAL_TABLET | Freq: Every day | ORAL | 3 refills | Status: DC

## 2022-04-13 MED ORDER — SERTRALINE HCL 50 MG PO TABS: 50.0000 mg | ORAL_TABLET | Freq: Every day | ORAL | 3 refills | Status: DC

## 2022-04-13 NOTE — Progress Notes (Signed)
Subjective:    Patient ID: YOUSRA Poole, female    DOB: Oct 18, 1960, 61 y.o.   MRN: [id number]  HPI Here for a transitional care visit to follow up a hospital stay from 04-05-22 to 04-08-22 for acute pancreatitis. She presented with upper abdominal pain, nausea, and vomiting. No fever. BMs were normal. Her admission WBC was 11.0 and this was down to 7.5 at DC. Her admission Hgb was 11.9 and this was down to 9.8 at DC. Her lipase was elevated to 209, and a CT scan confirmed pancreatitis. This was likely the result of alcohol abuse. She admitted to drinking at least a pint of liquor every night. She was given IV fluids and put on bowel rest. She quickly improved and was sent home on a lowfat diet. She feels much better now. She has no pain or nausea. During the stay her creatinine rose to 2.04 so her Lisinopril was stopped and she was started on Amlodipine 10 mg daily. This has worked well, and the creatinine went down to 1.74 at DC. She has continued to drink alcohol daily since going home, but she says she has cut back greatly on the amount. When I asked her about the alcohol abuse, she immediately began crying and proceeded to tell me about several family issues that have been causing her tremendous amounts of stress. Some of these occurred years ago when she was a child, and some are still happening now. She has been very depressed and angry. She has trouble sleeping. She says the only way she has been dealing with this is by drinking alcohol and by using crack cocaine several days a month. Her partner, who had been a huge source of support to her, is a recovering alcoholic. He has been in a residential recovery program since February. He is doing well, but she only gets to see him a few hours at a time about 2-3 times a month. She now has no one to talk to. She has looked into several recovery programs in the area for herself, including [facility], but she says financial and family issues have kept  her from joining them. She had a therapist years ago that she was comfortable with, but they moved away.    Review of Systems  Constitutional: Negative.   Respiratory: Negative.    Cardiovascular: Negative.   Gastrointestinal: Negative.   Psychiatric/Behavioral:  Positive for decreased concentration, dysphoric mood and sleep disturbance. Negative for agitation, behavioral problems, confusion, hallucinations, self-injury and suicidal ideas. The patient is nervous/anxious.        Objective:   Physical Exam Constitutional:      Appearance: Normal appearance. She is not ill-appearing.  Cardiovascular:     Rate and Rhythm: Normal rate and regular rhythm.     Pulses: Normal pulses.     Heart sounds: Normal heart sounds.  Pulmonary:     Effort: Pulmonary effort is normal.     Breath sounds: Normal breath sounds.  Abdominal:     General: Abdomen is flat. Bowel sounds are normal. There is no distension.     Palpations: Abdomen is soft. There is no mass.     Tenderness: There is no abdominal tenderness. There is no guarding or rebound.     Hernia: No hernia is present.  Neurological:     General: No focal deficit present.     Mental Status: She is alert and oriented to person, place, and time.  Psychiatric:  Behavior: Behavior normal.        Thought Content: Thought content normal.     Comments: She is very depressed and tearful, but eye contact is good            Assessment & Plan:  She is recovering from a bout of pancreatitis. We will check labs today including a BMET, a CBC, and a lipase. She also had some AKI so we will check a creatinine. Her HTN is well controlled on Amlodipine. She is also dealing with significant depression on top of polysubstance abuse. We will start her on Zoloft 50 mg daily. I strongly urged her to avoid alcohol and illicit drugs. I also urged her to start working with a psychotherapist again. She will follow up with me in 2 weeks. We spent a total  of (35   ) minutes reviewing records and discussing these issues.  Gershon Crane, MD

## 2022-04-25 LAB — IRON,TIBC AND FERRITIN PANEL
Ferritin: 288
Iron: 93
TIBC: 298
UIBC: 205

## 2022-04-25 LAB — BASIC METABOLIC PANEL
BUN: 21 (ref 4–21)
Chloride: 104 (ref 99–108)
Creatinine: 1.7 — AB (ref 0.5–1.1)
Glucose: 102
Potassium: 4 mEq/L (ref 3.5–5.1)
Sodium: 136 — AB (ref 137–147)

## 2022-04-25 LAB — MICROALBUMIN / CREATININE URINE RATIO: Microalb Creat Ratio: 229

## 2022-04-25 LAB — CBC AND DIFFERENTIAL: Hemoglobin: 10.7 — AB (ref 12.0–16.0)

## 2022-04-25 LAB — PROTEIN / CREATININE RATIO, URINE
Albumin, U: 541.9
Creatinine, Urine: 237.1

## 2022-04-25 LAB — COMPREHENSIVE METABOLIC PANEL
Albumin: 1.3 — AB (ref 3.5–5.0)
eGFR: 33

## 2022-05-02 ENCOUNTER — Other Ambulatory Visit: Payer: Self-pay | Admitting: Family Medicine

## 2022-06-12 ENCOUNTER — Telehealth: Payer: Self-pay

## 2022-06-12 NOTE — Telephone Encounter (Signed)
-  Caller states that she is calling, because she sees lumps in her vein. States lump is where she had an IV last month.  06/11/2022 11:01:54 AM See HCP within 4 Hours (or PCP triage) Waymond Cera, RN, Dalene Carrow User: Sharol Given, RN Date/Time Eilene Ghazi Time): 06/11/2022 11:01:40 AM Caller states more than one lump and areas are black from IV a month ago. States that one of the lumps is long. - Upgraded Caller states that she is going to be seen tomorrow in the office. Advised risk of getting worse/possible death if delaying care. Verbalizes understanding.  User: Sharol Given, RN Date/Time Eilene Ghazi Time): 06/11/2022 11:02:44 AM Caller states that she is wanting to make appointment. Advised that I do not have access to that after hours and would have to call back in the am. Verbalizes understanding.  Referrals GO TO FACILITY REFUSED  06/12/22 at 1107 - Pt assured that what she is seeing is normal & not clotted blood. Pt declines to make an appt at this time. Denies further ques/concerns.

## 2022-07-12 ENCOUNTER — Ambulatory Visit: Admitting: Family Medicine

## 2022-07-13 ENCOUNTER — Ambulatory Visit (INDEPENDENT_AMBULATORY_CARE_PROVIDER_SITE_OTHER): Admitting: Family Medicine

## 2022-07-13 ENCOUNTER — Telehealth: Payer: Self-pay | Admitting: Family Medicine

## 2022-07-13 ENCOUNTER — Encounter: Payer: Self-pay | Admitting: Family Medicine

## 2022-07-13 VITALS — BP 110/74 | HR 93 | Temp 97.8°F | Wt 144.0 lb

## 2022-07-13 DIAGNOSIS — R197 Diarrhea, unspecified: Secondary | ICD-10-CM

## 2022-07-13 DIAGNOSIS — S00411A Abrasion of right ear, initial encounter: Secondary | ICD-10-CM | POA: Diagnosis not present

## 2022-07-13 LAB — URINALYSIS, ROUTINE W REFLEX MICROSCOPIC
Hgb urine dipstick: NEGATIVE
Nitrite: NEGATIVE
RBC / HPF: NONE SEEN (ref 0–?)
Specific Gravity, Urine: 1.025 (ref 1.000–1.030)
Total Protein, Urine: 100 — AB
Urine Glucose: NEGATIVE
Urobilinogen, UA: 1 (ref 0.0–1.0)
pH: 6 (ref 5.0–8.0)

## 2022-07-13 LAB — CBC WITH DIFFERENTIAL/PLATELET
Basophils Absolute: 0.1 10*3/uL (ref 0.0–0.1)
Basophils Relative: 0.9 % (ref 0.0–3.0)
Eosinophils Absolute: 0.1 10*3/uL (ref 0.0–0.7)
Eosinophils Relative: 1.2 % (ref 0.0–5.0)
HCT: 31.6 % — ABNORMAL LOW (ref 36.0–46.0)
Hemoglobin: 10.8 g/dL — ABNORMAL LOW (ref 12.0–15.0)
Lymphocytes Relative: 26.7 % (ref 12.0–46.0)
Lymphs Abs: 2.2 10*3/uL (ref 0.7–4.0)
MCHC: 34.1 g/dL (ref 30.0–36.0)
MCV: 101.8 fl — ABNORMAL HIGH (ref 78.0–100.0)
Monocytes Absolute: 0.4 10*3/uL (ref 0.1–1.0)
Monocytes Relative: 4.7 % (ref 3.0–12.0)
Neutro Abs: 5.5 10*3/uL (ref 1.4–7.7)
Neutrophils Relative %: 66.5 % (ref 43.0–77.0)
Platelets: 269 10*3/uL (ref 150.0–400.0)
RBC: 3.11 Mil/uL — ABNORMAL LOW (ref 3.87–5.11)
RDW: 14.5 % (ref 11.5–15.5)
WBC: 8.2 10*3/uL (ref 4.0–10.5)

## 2022-07-13 LAB — HEPATIC FUNCTION PANEL
ALT: 39 U/L — ABNORMAL HIGH (ref 0–35)
AST: 130 U/L — ABNORMAL HIGH (ref 0–37)
Albumin: 3.8 g/dL (ref 3.5–5.2)
Alkaline Phosphatase: 159 U/L — ABNORMAL HIGH (ref 39–117)
Bilirubin, Direct: 0.2 mg/dL (ref 0.0–0.3)
Total Bilirubin: 0.6 mg/dL (ref 0.2–1.2)
Total Protein: 6.9 g/dL (ref 6.0–8.3)

## 2022-07-13 LAB — BASIC METABOLIC PANEL
BUN: 13 mg/dL (ref 6–23)
CO2: 21 mEq/L (ref 19–32)
Calcium: 7.4 mg/dL — ABNORMAL LOW (ref 8.4–10.5)
Chloride: 104 mEq/L (ref 96–112)
Creatinine, Ser: 2.1 mg/dL — ABNORMAL HIGH (ref 0.40–1.20)
GFR: 24.98 mL/min — ABNORMAL LOW (ref >60.00)
Glucose, Bld: 95 mg/dL (ref 70–99)
Potassium: 3 mEq/L — ABNORMAL LOW (ref 3.5–5.1)
Sodium: 139 mEq/L (ref 135–145)

## 2022-07-13 LAB — LIPASE: Lipase: 17 U/L (ref 11.0–59.0)

## 2022-07-13 MED ORDER — CIPROFLOXACIN HCL 500 MG PO TABS: 500.0000 mg | ORAL_TABLET | Freq: Two times a day (BID) | ORAL | 0 refills | Status: AC

## 2022-07-13 MED ORDER — DIPHENOXYLATE-ATROPINE 2.5-0.025 MG PO TABS: 2.0000 | ORAL_TABLET | Freq: Four times a day (QID) | ORAL | 0 refills | Status: DC | PRN

## 2022-07-13 MED ORDER — CIPROFLOXACIN-DEXAMETHASONE 0.3-0.1 % OT SUSP: 4.0000 [drp] | Freq: Two times a day (BID) | OTIC | 0 refills | Status: DC

## 2022-07-13 NOTE — Telephone Encounter (Addendum)
Pt called to say she went to the [facility] [id number] [place], [place] - [address] Phone: [phone]  Fax: [phone]     And the prescription for her ear will cost $100 and her insurance will not cover it.  She is asking for an alternative.

## 2022-07-13 NOTE — Addendum Note (Signed)
Addended by: Gershon Crane A on: 07/13/2022 12:17 PM   Modules accepted: Orders

## 2022-07-13 NOTE — Progress Notes (Signed)
   Subjective:    Patient ID: Kelsey Poole, female    DOB: 1960/10/12, 61 y.o.   MRN: [id number]  HPI Here for several issues. First she began to have diarrhea about 4 weeks ago and it has never stopped since then. No abdominal pain or fever. She has had some nausea but has not vomited. No recent travel. No recent medication changes. She hhas a hx of alcoholic pancreatitis, but she says this feels very different from that. Also about 2 weeks ago while she was cleaning her right ea with a Q Tip, she bumped her elbow against the door frame and the Q Tip was jabbed into her ear. She has had pain ever since, but no bleeding. Her hearing is normal.    Review of Systems  Constitutional: Negative.   HENT:  Positive for ear pain.   Respiratory: Negative.    Cardiovascular: Negative.   Gastrointestinal:  Positive for diarrhea and nausea. Negative for abdominal distention, abdominal pain, blood in stool, constipation, rectal pain and vomiting.  Genitourinary: Negative.        Objective:   Physical Exam Constitutional:      Appearance: Normal appearance.  HENT:     Right Ear: Tympanic membrane normal.     Left Ear: Tympanic membrane, ear canal and external ear normal.     Ears:     Comments: Right ear canal has an area of erythema     Nose: Nose normal.     Mouth/Throat:     Pharynx: Oropharynx is clear.  Eyes:     Conjunctiva/sclera: Conjunctivae normal.  Cardiovascular:     Rate and Rhythm: Normal rate and regular rhythm.     Pulses: Normal pulses.     Heart sounds: Normal heart sounds.  Pulmonary:     Effort: Pulmonary effort is normal.     Breath sounds: Normal breath sounds.  Abdominal:     General: Abdomen is flat. Bowel sounds are normal. There is no distension.     Palpations: Abdomen is soft. There is no mass.     Tenderness: There is no abdominal tenderness. There is no guarding or rebound.     Hernia: No hernia is present.  Lymphadenopathy:     Cervical: No cervical  adenopathy.  Neurological:     Mental Status: She is alert.           Assessment & Plan:  She has an abrasion of the right ear canal, and we will treat this with Ciprodex drops. The etiology of the diarrhea is not clear, but she can use Lomotil to help control it. We will cover for possible infections with 10 days of Cipro. We will check labs including CBC, lipase, and a C diff assay.  Gershon Crane, MD

## 2022-07-13 NOTE — Addendum Note (Signed)
Addended by: Gershon Crane A on: 07/13/2022 12:00 PM   Modules accepted: Orders

## 2022-07-14 NOTE — Telephone Encounter (Signed)
Patient is currently prescribed Ciprofloxacin suspension

## 2022-07-14 NOTE — Telephone Encounter (Signed)
I filled out a form to fax to the pharmacy authorizing them to change this to Cortisporin Otic drops

## 2022-07-14 NOTE — Telephone Encounter (Signed)
Pt form was faxed to [facility]

## 2022-07-14 NOTE — Telephone Encounter (Signed)
Pt is calling checking on the below message and please send alternative medication to  [facility] [id number] [place], [place] - [address] Phone: [phone]  Fax: [phone]

## 2022-07-15 LAB — CLOSTRIDIUM DIFFICILE BY PCR: Toxigenic C. Difficile by PCR: NEGATIVE

## 2022-07-24 ENCOUNTER — Ambulatory Visit: Admitting: Family Medicine

## 2022-07-25 ENCOUNTER — Ambulatory Visit: Admitting: Family Medicine

## 2022-08-01 ENCOUNTER — Ambulatory Visit: Admitting: Family Medicine

## 2022-09-15 ENCOUNTER — Ambulatory Visit: Admitting: Family Medicine

## 2022-09-20 ENCOUNTER — Ambulatory Visit (INDEPENDENT_AMBULATORY_CARE_PROVIDER_SITE_OTHER): Admitting: Family Medicine

## 2022-09-20 ENCOUNTER — Encounter: Payer: Self-pay | Admitting: Family Medicine

## 2022-09-20 VITALS — BP 124/80 | HR 103 | Temp 97.6°F | Wt 148.0 lb

## 2022-09-20 DIAGNOSIS — H6012 Cellulitis of left external ear: Secondary | ICD-10-CM | POA: Diagnosis not present

## 2022-09-20 DIAGNOSIS — L309 Dermatitis, unspecified: Secondary | ICD-10-CM | POA: Diagnosis not present

## 2022-09-20 MED ORDER — CEPHALEXIN 500 MG PO CAPS: 500.0000 mg | ORAL_CAPSULE | Freq: Three times a day (TID) | ORAL | 0 refills | Status: DC

## 2022-09-20 MED ORDER — TRIAMCINOLONE ACETONIDE 0.1 % EX CREA: 1.0000 | TOPICAL_CREAM | Freq: Two times a day (BID) | CUTANEOUS | 2 refills | Status: DC

## 2022-09-20 NOTE — Progress Notes (Signed)
   Subjective:    Patient ID: Kelsey Poole, female    DOB: [birth year], 62 y.o.   MRN: [id number]  HPI Here for one week of a painful rash on the left earlobe which drains clear fluid. Prior to this she has had an itchy rash on both earlobes, as well as scattered areas of skin over her body.    Review of Systems  Constitutional: Negative.   HENT:  Positive for ear discharge and ear pain. Negative for congestion, facial swelling, postnasal drip, sinus pressure and sore throat.   Eyes: Negative.   Respiratory: Negative.    Skin:  Positive for rash.       Objective:   Physical Exam Constitutional:      General: She is not in acute distress.    Appearance: Normal appearance.  Cardiovascular:     Rate and Rhythm: Normal rate and regular rhythm.     Pulses: Normal pulses.     Heart sounds: Normal heart sounds.  Pulmonary:     Effort: Pulmonary effort is normal.     Breath sounds: Normal breath sounds.  Skin:    Comments: Both inferior ear lobes have erythematous scaly areas of skin, and on the left earlobe this is tender and is draining some serous fluid. She also has areas dry scaling skin on the arms and legs   Neurological:     Mental Status: She is alert.           Assessment & Plan:  She has eczema, especially on the ear lobes. The left ear lobe now also has a cellulitis. Treat the cellulitis with 10 days of Keflex, and treat the eczema as needed with Triamcinolone cream. Alysia Penna, MD

## 2022-09-25 ENCOUNTER — Ambulatory Visit (HOSPITAL_COMMUNITY)
Admission: EM | Admit: 2022-09-25 | Discharge: 2022-09-25 | Disposition: A | Discharge status: Home / Self Care | Attending: Family Medicine | Admitting: Family Medicine

## 2022-09-25 ENCOUNTER — Telehealth: Payer: Self-pay | Admitting: Family Medicine

## 2022-09-25 ENCOUNTER — Other Ambulatory Visit: Payer: Self-pay

## 2022-09-25 ENCOUNTER — Encounter (HOSPITAL_COMMUNITY): Payer: Self-pay | Admitting: *Deleted

## 2022-09-25 DIAGNOSIS — H1033 Unspecified acute conjunctivitis, bilateral: Secondary | ICD-10-CM | POA: Diagnosis not present

## 2022-09-25 DIAGNOSIS — T7840XA Allergy, unspecified, initial encounter: Secondary | ICD-10-CM | POA: Diagnosis not present

## 2022-09-25 MED ORDER — GENTAMICIN SULFATE 0.3 % OP SOLN: 2.0000 [drp] | Freq: Three times a day (TID) | OPHTHALMIC | 0 refills | Status: AC

## 2022-09-25 MED ORDER — TRIAMCINOLONE ACETONIDE 40 MG/ML IJ SUSP
40.0000 mg | Freq: Once | INTRAMUSCULAR | Status: AC
  Administered 2022-09-25: 40 mg via INTRAMUSCULAR

## 2022-09-25 MED ORDER — TRIAMCINOLONE ACETONIDE 40 MG/ML IJ SUSP
INTRAMUSCULAR | Status: AC
  Filled 2022-09-25: qty 1
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
## 2022-09-25 NOTE — Discharge Instructions (Addendum)
You have been given an injection of triamcinolone, which is a cortisone type medicine to treat the allergic reaction  You can take Benadryl as needed or Zyrtec as needed for the itching.  Put gentamicin eyedrops in the affected eye(s) 3 times daily for 5 days.  Using the triamcinolone cream on the rash areas
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
## 2022-09-26 ENCOUNTER — Ambulatory Visit: Admitting: Family Medicine

## 2022-09-26 NOTE — Telephone Encounter (Signed)
Error- please disregard

## 2022-09-27 ENCOUNTER — Ambulatory Visit: Admitting: Family Medicine

## 2022-10-02 ENCOUNTER — Ambulatory Visit: Admitting: Family Medicine

## 2022-10-02 ENCOUNTER — Telehealth: Payer: Self-pay

## 2022-10-02 ENCOUNTER — Encounter: Payer: Self-pay | Admitting: Family Medicine

## 2022-10-02 NOTE — Telephone Encounter (Signed)
Pt came into the office today 1 hour late for appointment. When patient was told she was one hour late for her appointment she began to use disparaging and demeaning language with team members. She began to threaten the team stating "She was going to be seen now or there was going to be a problem" . This behavior was witnessed by patients sitting in the lobby area. The team then called the practice administrator to speak with the patient. I then asked the patient to come into the office in a patient room and explained to her that this behavior could not be done here in the clinic. The patient then begin to use demeaning language with me and then got up and walked out. The provider has agreed on dismissal of this patient we will provide her with a letter to assist in seeking care and will handel medical needs for 30 days.

## 2022-10-04 ENCOUNTER — Encounter (HOSPITAL_COMMUNITY): Payer: Self-pay

## 2022-10-04 ENCOUNTER — Ambulatory Visit (HOSPITAL_COMMUNITY)
Admission: EM | Admit: 2022-10-04 | Discharge: 2022-10-04 | Disposition: A | Discharge status: Home / Self Care | Attending: Sports Medicine | Admitting: Sports Medicine

## 2022-10-04 DIAGNOSIS — L299 Pruritus, unspecified: Secondary | ICD-10-CM

## 2022-10-04 DIAGNOSIS — H60312 Diffuse otitis externa, left ear: Secondary | ICD-10-CM | POA: Diagnosis not present

## 2022-10-04 MED ORDER — METHYLPREDNISOLONE SODIUM SUCC 125 MG IJ SOLR
INTRAMUSCULAR | Status: AC
  Filled 2022-10-04: qty 2

## 2022-10-04 MED ORDER — HYDROXYZINE HCL 25 MG PO TABS: 25.0000 mg | ORAL_TABLET | Freq: Four times a day (QID) | ORAL | 0 refills | Status: DC

## 2022-10-04 MED ORDER — METHYLPREDNISOLONE SODIUM SUCC 125 MG IJ SOLR
60.0000 mg | Freq: Once | INTRAMUSCULAR | Status: AC
  Administered 2022-10-04: 60 mg via INTRAMUSCULAR

## 2022-10-04 MED ORDER — NEOMYCIN-POLYMYXIN-HC 3.5-10000-1 OT SUSP: 4.0000 [drp] | Freq: Three times a day (TID) | OTIC | 0 refills | Status: DC
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
## 2022-10-04 NOTE — ED Triage Notes (Signed)
Pt states here last week for same. C/o lt ear pain/itching/draining. C/o itching all over. The meds rx'd last week didn't help.

## 2022-10-09 ENCOUNTER — Ambulatory Visit: Payer: Self-pay | Admitting: *Deleted

## 2022-10-09 NOTE — Telephone Encounter (Signed)
  Chief Complaint: Rash Symptoms: "Eczema"  Allergic reaction to creams, "They broke me now fix it." Frequency:  Pertinent Negatives: Patient denies  Disposition: [] ED /[] Urgent Care (no appt availability in office) / [] Appointment(In office/virtual)/ []  [facility]/ [] Home Care/ [] Refused Recommended Disposition /[] [facility]/ [x]  Follow-up with PCP Additional Notes: Called community line. Incomplete triage. Pt evasive, affect fluctuates, angry at times, difficult to redirect. Noted pt dismissed from practice, will be seen for 30 days.  Please advise. Reason for Disposition  SEVERE itching (i.e., interferes with sleep, normal activities or school)  Answer Assessment - Initial Assessment Questions 1. APPEARANCE of RASH: "Describe the rash." (e.g., spots, blisters, raised areas, skin peeling, scaly)     Red , patchy 2. SIZE: "How big are the spots?" (e.g., tip of pen, eraser, coin; inches, centimeters)     Patches 3. LOCATION: "Where is the rash located?"     "All over"   Patches above waist 4. COLOR: "What color is the rash?" (Note: It is difficult to assess rash color in people with darker-colored skin. When this situation occurs, simply ask the caller to describe what they see.)     Eczema 5. ONSET: "When did the rash begin?"     Years 6. FEVER: "Do you have a fever?" If Yes, ask: "What is your temperature, how was it measured, and when did it start?"     No 7. ITCHING: "Does the rash itch?" If Yes, ask: "How bad is the itch?" (Scale 1-10; or mild, moderate, severe)     Severe 8. CAUSE: "What do you think is causing the rash?"     Eczema 9. MEDICINE FACTORS: "Have you started any new medicines within the last 2 weeks?" (e.g., antibiotics)      No 10. OTHER SYMPTOMS: "Do you have any other symptoms?" (e.g., dizziness, headache, sore throat, joint pain)       no  Protocols used: Rash or Redness - [facility]

## 2022-10-11 NOTE — Telephone Encounter (Signed)
Calling back today on community line, would like to hear back from Dr. Sarajane Jews. Assured NT would alert practice.

## 2022-10-19 ENCOUNTER — Inpatient Hospital Stay (HOSPITAL_COMMUNITY)
Admission: EM | Admit: 2022-10-19 | Discharge: 2022-10-22 | DRG: 683 | Disposition: A | Discharge status: Home / Self Care | Attending: Internal Medicine | Admitting: Internal Medicine

## 2022-10-19 ENCOUNTER — Encounter (HOSPITAL_COMMUNITY): Payer: Self-pay

## 2022-10-19 ENCOUNTER — Other Ambulatory Visit: Payer: Self-pay

## 2022-10-19 ENCOUNTER — Emergency Department (HOSPITAL_COMMUNITY)

## 2022-10-19 DIAGNOSIS — D638 Anemia in other chronic diseases classified elsewhere: Secondary | ICD-10-CM | POA: Diagnosis not present

## 2022-10-19 DIAGNOSIS — Z83438 Family history of other disorder of lipoprotein metabolism and other lipidemia: Secondary | ICD-10-CM | POA: Diagnosis not present

## 2022-10-19 DIAGNOSIS — G4489 Other headache syndrome: Secondary | ICD-10-CM

## 2022-10-19 DIAGNOSIS — Z833 Family history of diabetes mellitus: Secondary | ICD-10-CM

## 2022-10-19 DIAGNOSIS — F1010 Alcohol abuse, uncomplicated: Secondary | ICD-10-CM | POA: Diagnosis not present

## 2022-10-19 DIAGNOSIS — E876 Hypokalemia: Secondary | ICD-10-CM | POA: Diagnosis not present

## 2022-10-19 DIAGNOSIS — E871 Hypo-osmolality and hyponatremia: Secondary | ICD-10-CM | POA: Diagnosis not present

## 2022-10-19 DIAGNOSIS — D539 Nutritional anemia, unspecified: Secondary | ICD-10-CM | POA: Insufficient documentation

## 2022-10-19 DIAGNOSIS — Z881 Allergy status to other antibiotic agents status: Secondary | ICD-10-CM

## 2022-10-19 DIAGNOSIS — Z825 Family history of asthma and other chronic lower respiratory diseases: Secondary | ICD-10-CM

## 2022-10-19 DIAGNOSIS — N1831 Chronic kidney disease, stage 3a: Secondary | ICD-10-CM | POA: Diagnosis present

## 2022-10-19 DIAGNOSIS — F17210 Nicotine dependence, cigarettes, uncomplicated: Secondary | ICD-10-CM | POA: Diagnosis present

## 2022-10-19 DIAGNOSIS — I129 Hypertensive chronic kidney disease with stage 1 through stage 4 chronic kidney disease, or unspecified chronic kidney disease: Secondary | ICD-10-CM | POA: Diagnosis not present

## 2022-10-19 DIAGNOSIS — R519 Headache, unspecified: Secondary | ICD-10-CM | POA: Diagnosis present

## 2022-10-19 DIAGNOSIS — E860 Dehydration: Secondary | ICD-10-CM | POA: Diagnosis present

## 2022-10-19 DIAGNOSIS — J45909 Unspecified asthma, uncomplicated: Secondary | ICD-10-CM | POA: Diagnosis present

## 2022-10-19 DIAGNOSIS — N179 Acute kidney failure, unspecified: Principal | ICD-10-CM | POA: Diagnosis present

## 2022-10-19 DIAGNOSIS — N1832 Chronic kidney disease, stage 3b: Secondary | ICD-10-CM | POA: Diagnosis present

## 2022-10-19 DIAGNOSIS — Z79899 Other long term (current) drug therapy: Secondary | ICD-10-CM | POA: Diagnosis not present

## 2022-10-19 DIAGNOSIS — Z885 Allergy status to narcotic agent status: Secondary | ICD-10-CM | POA: Diagnosis not present

## 2022-10-19 DIAGNOSIS — N170 Acute kidney failure with tubular necrosis: Principal | ICD-10-CM | POA: Diagnosis present

## 2022-10-19 DIAGNOSIS — R9431 Abnormal electrocardiogram [ECG] [EKG]: Secondary | ICD-10-CM | POA: Diagnosis present

## 2022-10-19 DIAGNOSIS — Z8249 Family history of ischemic heart disease and other diseases of the circulatory system: Secondary | ICD-10-CM | POA: Diagnosis not present

## 2022-10-19 DIAGNOSIS — F17200 Nicotine dependence, unspecified, uncomplicated: Secondary | ICD-10-CM | POA: Diagnosis present

## 2022-10-19 LAB — COMPREHENSIVE METABOLIC PANEL
ALT: 46 U/L — ABNORMAL HIGH (ref 0–44)
AST: 124 U/L — ABNORMAL HIGH (ref 15–41)
Albumin: 3.8 g/dL (ref 3.5–5.0)
Alkaline Phosphatase: 112 U/L (ref 38–126)
Anion gap: 22 — ABNORMAL HIGH (ref 5–15)
BUN: 43 mg/dL — ABNORMAL HIGH (ref 8–23)
CO2: 17 mmol/L — ABNORMAL LOW (ref 22–32)
Calcium: 7.8 mg/dL — ABNORMAL LOW (ref 8.9–10.3)
Chloride: 95 mmol/L — ABNORMAL LOW (ref 98–111)
Creatinine, Ser: 5.9 mg/dL — ABNORMAL HIGH (ref 0.44–1.00)
GFR, Estimated: 8 mL/min — ABNORMAL LOW (ref >60)
Glucose, Bld: 106 mg/dL — ABNORMAL HIGH (ref 70–99)
Potassium: 3.2 mmol/L — ABNORMAL LOW (ref 3.5–5.1)
Sodium: 134 mmol/L — ABNORMAL LOW (ref 135–145)
Total Bilirubin: 0.5 mg/dL (ref 0.3–1.2)
Total Protein: 6.8 g/dL (ref 6.5–8.1)

## 2022-10-19 LAB — CBC
HCT: 30.7 % — ABNORMAL LOW (ref 36.0–46.0)
Hemoglobin: 11 g/dL — ABNORMAL LOW (ref 12.0–15.0)
MCH: 36.4 pg — ABNORMAL HIGH (ref 26.0–34.0)
MCHC: 35.8 g/dL (ref 30.0–36.0)
MCV: 101.7 fL — ABNORMAL HIGH (ref 80.0–100.0)
Platelets: 221 10*3/uL (ref 150–400)
RBC: 3.02 MIL/uL — ABNORMAL LOW (ref 3.87–5.11)
RDW: 13.2 % (ref 11.5–15.5)
WBC: 10 10*3/uL (ref 4.0–10.5)
nRBC: 0 % (ref 0.0–0.2)

## 2022-10-19 LAB — MAGNESIUM: Magnesium: 0.9 mg/dL — CL (ref 1.7–2.4)

## 2022-10-19 LAB — URINALYSIS, ROUTINE W REFLEX MICROSCOPIC
Bilirubin Urine: NEGATIVE
Glucose, UA: NEGATIVE mg/dL
Ketones, ur: 5 mg/dL — AB
Nitrite: NEGATIVE
Protein, ur: 100 mg/dL — AB
Specific Gravity, Urine: 1.018 (ref 1.005–1.030)
WBC, UA: >50 WBC/hpf (ref 0–5)
pH: 5 (ref 5.0–8.0)

## 2022-10-19 LAB — CBG MONITORING, ED
Glucose-Capillary: 130 mg/dL — ABNORMAL HIGH (ref 70–99)
Glucose-Capillary: 87 mg/dL (ref 70–99)

## 2022-10-19 MED ORDER — ACETAMINOPHEN 325 MG PO TABS
650.0000 mg | ORAL_TABLET | Freq: Once | ORAL | Status: AC
  Administered 2022-10-19: 650 mg via ORAL
  Filled 2022-10-19: qty 2

## 2022-10-19 MED ORDER — ONDANSETRON 4 MG PO TBDP
4.0000 mg | ORAL_TABLET | Freq: Once | ORAL | Status: AC
  Administered 2022-10-19: 4 mg via ORAL
  Filled 2022-10-19: qty 1

## 2022-10-19 NOTE — ED Provider Triage Note (Signed)
Emergency Medicine Provider Triage Evaluation Note  KIRBI SIRMON , a 62 y.o. female  was evaluated in triage.  Pt complains of headache. Reports pain in the right temple for the last two days coming and going. Patient tried drinking alcohol and taking ibuprofen to see if it helped. Associated nausea and vomiting and feels generally weak.  Review of Systems  Positive: Headache, nausea, vomiting Negative: Numbness  Physical Exam  BP 109/79   Pulse (!) 115   Temp 98 F (36.7 C) (Oral)   Ht 5' 10"$  (1.778 m)   Wt 65.8 kg   SpO2 100%   BMI 20.81 kg/m  Gen:   Awake, uncomfortable appearing Resp:  Normal effort  MSK:   Moves extremities without difficulty, strength 5/5 in all 4 extremities,  Other:  Face grossly symmetrical, PEERL bilaterally  Medical Decision Making  Medically screening exam initiated at 9:01 PM.  Appropriate orders placed.  NEIKA FRIEDRICHS was informed that the remainder of the evaluation will be completed by another provider, this initial triage assessment does not replace that evaluation, and the importance of remaining in the ED until their evaluation is complete.    Kemper Durie, DO 10/19/22 2113

## 2022-10-19 NOTE — ED Triage Notes (Signed)
Pt arrived by EMS from home complaining of headache, nausea and vomiting x2 days. Pt states that she has had this headache for 2 days but today it suddenly got a lot worse.   Pt that yesterday the ibuprofen helped

## 2022-10-19 NOTE — ED Notes (Signed)
Date and time results received: 10/19/22 2252 (use smartphrase ".now" to insert current time)  Test: Mg Critical Value: 0.9  Name of Provider Notified: R. Marlon Pel, PA-C

## 2022-10-19 NOTE — ED Provider Notes (Signed)
Notified charge RN that patient needs next room.   Montine Circle, PA-C 10/19/22 2332    Ripley Fraise, MD 10/20/22 [phone]

## 2022-10-20 ENCOUNTER — Inpatient Hospital Stay (HOSPITAL_COMMUNITY)

## 2022-10-20 ENCOUNTER — Emergency Department (HOSPITAL_COMMUNITY)

## 2022-10-20 DIAGNOSIS — Z79899 Other long term (current) drug therapy: Secondary | ICD-10-CM | POA: Diagnosis not present

## 2022-10-20 DIAGNOSIS — N179 Acute kidney failure, unspecified: Secondary | ICD-10-CM | POA: Diagnosis present

## 2022-10-20 DIAGNOSIS — E876 Hypokalemia: Secondary | ICD-10-CM | POA: Insufficient documentation

## 2022-10-20 DIAGNOSIS — J45909 Unspecified asthma, uncomplicated: Secondary | ICD-10-CM | POA: Diagnosis present

## 2022-10-20 DIAGNOSIS — D638 Anemia in other chronic diseases classified elsewhere: Secondary | ICD-10-CM | POA: Diagnosis present

## 2022-10-20 DIAGNOSIS — Z83438 Family history of other disorder of lipoprotein metabolism and other lipidemia: Secondary | ICD-10-CM | POA: Diagnosis not present

## 2022-10-20 DIAGNOSIS — F17210 Nicotine dependence, cigarettes, uncomplicated: Secondary | ICD-10-CM | POA: Diagnosis present

## 2022-10-20 DIAGNOSIS — Z833 Family history of diabetes mellitus: Secondary | ICD-10-CM | POA: Diagnosis not present

## 2022-10-20 DIAGNOSIS — F1010 Alcohol abuse, uncomplicated: Secondary | ICD-10-CM | POA: Diagnosis not present

## 2022-10-20 DIAGNOSIS — R519 Headache, unspecified: Secondary | ICD-10-CM | POA: Diagnosis present

## 2022-10-20 DIAGNOSIS — Z885 Allergy status to narcotic agent status: Secondary | ICD-10-CM | POA: Diagnosis not present

## 2022-10-20 DIAGNOSIS — Z825 Family history of asthma and other chronic lower respiratory diseases: Secondary | ICD-10-CM | POA: Diagnosis not present

## 2022-10-20 DIAGNOSIS — E871 Hypo-osmolality and hyponatremia: Secondary | ICD-10-CM | POA: Diagnosis present

## 2022-10-20 DIAGNOSIS — N170 Acute kidney failure with tubular necrosis: Secondary | ICD-10-CM | POA: Diagnosis present

## 2022-10-20 DIAGNOSIS — N1832 Chronic kidney disease, stage 3b: Secondary | ICD-10-CM | POA: Diagnosis not present

## 2022-10-20 DIAGNOSIS — Z881 Allergy status to other antibiotic agents status: Secondary | ICD-10-CM | POA: Diagnosis not present

## 2022-10-20 DIAGNOSIS — D539 Nutritional anemia, unspecified: Secondary | ICD-10-CM | POA: Insufficient documentation

## 2022-10-20 DIAGNOSIS — I129 Hypertensive chronic kidney disease with stage 1 through stage 4 chronic kidney disease, or unspecified chronic kidney disease: Secondary | ICD-10-CM | POA: Diagnosis present

## 2022-10-20 DIAGNOSIS — R9431 Abnormal electrocardiogram [ECG] [EKG]: Secondary | ICD-10-CM | POA: Diagnosis present

## 2022-10-20 DIAGNOSIS — E860 Dehydration: Secondary | ICD-10-CM | POA: Diagnosis present

## 2022-10-20 DIAGNOSIS — Z8249 Family history of ischemic heart disease and other diseases of the circulatory system: Secondary | ICD-10-CM | POA: Diagnosis not present

## 2022-10-20 LAB — ETHANOL: Alcohol, Ethyl (B): <10 mg/dL (ref <10)

## 2022-10-20 LAB — RAPID URINE DRUG SCREEN, HOSP PERFORMED
Amphetamines: NOT DETECTED
Barbiturates: NOT DETECTED
Benzodiazepines: NOT DETECTED
Cocaine: NOT DETECTED
Opiates: NOT DETECTED
Tetrahydrocannabinol: NOT DETECTED

## 2022-10-20 LAB — COMPREHENSIVE METABOLIC PANEL
ALT: 40 U/L (ref 0–44)
ALT: 42 U/L (ref 0–44)
AST: 117 U/L — ABNORMAL HIGH (ref 15–41)
AST: 110 U/L — ABNORMAL HIGH (ref 15–41)
Albumin: 3.4 g/dL — ABNORMAL LOW (ref 3.5–5.0)
Albumin: 3.4 g/dL — ABNORMAL LOW (ref 3.5–5.0)
Alkaline Phosphatase: 95 U/L (ref 38–126)
Alkaline Phosphatase: 95 U/L (ref 38–126)
Anion gap: 21 — ABNORMAL HIGH (ref 5–15)
Anion gap: 15 (ref 5–15)
BUN: 47 mg/dL — ABNORMAL HIGH (ref 8–23)
BUN: 45 mg/dL — ABNORMAL HIGH (ref 8–23)
CO2: 17 mmol/L — ABNORMAL LOW (ref 22–32)
CO2: 21 mmol/L — ABNORMAL LOW (ref 22–32)
Calcium: 7.3 mg/dL — ABNORMAL LOW (ref 8.9–10.3)
Calcium: 7.2 mg/dL — ABNORMAL LOW (ref 8.9–10.3)
Chloride: 94 mmol/L — ABNORMAL LOW (ref 98–111)
Chloride: 96 mmol/L — ABNORMAL LOW (ref 98–111)
Creatinine, Ser: 5.68 mg/dL — ABNORMAL HIGH (ref 0.44–1.00)
Creatinine, Ser: 5.51 mg/dL — ABNORMAL HIGH (ref 0.44–1.00)
GFR, Estimated: 8 mL/min — ABNORMAL LOW (ref >60)
GFR, Estimated: 8 mL/min — ABNORMAL LOW (ref >60)
Glucose, Bld: 104 mg/dL — ABNORMAL HIGH (ref 70–99)
Glucose, Bld: 128 mg/dL — ABNORMAL HIGH (ref 70–99)
Potassium: 3.2 mmol/L — ABNORMAL LOW (ref 3.5–5.1)
Potassium: 3.8 mmol/L (ref 3.5–5.1)
Sodium: 132 mmol/L — ABNORMAL LOW (ref 135–145)
Sodium: 132 mmol/L — ABNORMAL LOW (ref 135–145)
Total Bilirubin: 0.5 mg/dL (ref 0.3–1.2)
Total Bilirubin: 0.4 mg/dL (ref 0.3–1.2)
Total Protein: 6 g/dL — ABNORMAL LOW (ref 6.5–8.1)
Total Protein: 6.1 g/dL — ABNORMAL LOW (ref 6.5–8.1)

## 2022-10-20 LAB — CBC WITH DIFFERENTIAL/PLATELET
Abs Immature Granulocytes: 0.03 10*3/uL (ref 0.00–0.07)
Basophils Absolute: 0 10*3/uL (ref 0.0–0.1)
Basophils Relative: 0 %
Eosinophils Absolute: 0 10*3/uL (ref 0.0–0.5)
Eosinophils Relative: 0 %
HCT: 25.9 % — ABNORMAL LOW (ref 36.0–46.0)
Hemoglobin: 9.2 g/dL — ABNORMAL LOW (ref 12.0–15.0)
Immature Granulocytes: 0 %
Lymphocytes Relative: 5 %
Lymphs Abs: 0.4 10*3/uL — ABNORMAL LOW (ref 0.7–4.0)
MCH: 35.5 pg — ABNORMAL HIGH (ref 26.0–34.0)
MCHC: 35.5 g/dL (ref 30.0–36.0)
MCV: 100 fL (ref 80.0–100.0)
Monocytes Absolute: 0.2 10*3/uL (ref 0.1–1.0)
Monocytes Relative: 2 %
Neutro Abs: 8.6 10*3/uL — ABNORMAL HIGH (ref 1.7–7.7)
Neutrophils Relative %: 93 %
Platelets: 147 10*3/uL — ABNORMAL LOW (ref 150–400)
RBC: 2.59 MIL/uL — ABNORMAL LOW (ref 3.87–5.11)
RDW: 13.2 % (ref 11.5–15.5)
WBC: 9.2 10*3/uL (ref 4.0–10.5)
nRBC: 0 % (ref 0.0–0.2)

## 2022-10-20 LAB — OSMOLALITY, URINE: Osmolality, Ur: 327 mOsm/kg (ref 300–900)

## 2022-10-20 LAB — MAGNESIUM: Magnesium: 1.6 mg/dL — ABNORMAL LOW (ref 1.7–2.4)

## 2022-10-20 LAB — C-REACTIVE PROTEIN: CRP: 1 mg/dL — ABNORMAL HIGH (ref <1.0)

## 2022-10-20 LAB — OSMOLALITY: Osmolality: 293 mOsm/kg (ref 275–295)

## 2022-10-20 LAB — SEDIMENTATION RATE: Sed Rate: 21 mm/hr (ref 0–22)

## 2022-10-20 LAB — SODIUM, URINE, RANDOM: Sodium, Ur: 27 mmol/L

## 2022-10-20 LAB — CREATININE, URINE, RANDOM: Creatinine, Urine: 525 mg/dL

## 2022-10-20 LAB — LIPASE, BLOOD: Lipase: 34 U/L (ref 11–51)

## 2022-10-20 MED ORDER — THIAMINE MONONITRATE 100 MG PO TABS
100.0000 mg | ORAL_TABLET | Freq: Every day | ORAL | Status: DC
  Administered 2022-10-20 – 2022-10-22 (×3): 100 mg via ORAL
  Filled 2022-10-20 (×4): qty 1

## 2022-10-20 MED ORDER — ACETAMINOPHEN 325 MG PO TABS: 650.0000 mg | ORAL_TABLET | Freq: Four times a day (QID) | ORAL | Status: DC | PRN

## 2022-10-20 MED ORDER — MAGNESIUM SULFATE 2 GM/50ML IV SOLN
2.0000 g | Freq: Once | INTRAVENOUS | Status: AC
  Administered 2022-10-20: 2 g via INTRAVENOUS
  Filled 2022-10-20: qty 50

## 2022-10-20 MED ORDER — FOLIC ACID 1 MG PO TABS
1.0000 mg | ORAL_TABLET | Freq: Every day | ORAL | Status: DC
  Administered 2022-10-20 – 2022-10-22 (×3): 1 mg via ORAL
  Filled 2022-10-20 (×3): qty 1

## 2022-10-20 MED ORDER — LACTATED RINGERS IV SOLN: INTRAVENOUS | Status: DC

## 2022-10-20 MED ORDER — HEPARIN SODIUM (PORCINE) 5000 UNIT/ML IJ SOLN
5000.0000 [IU] | Freq: Three times a day (TID) | INTRAMUSCULAR | Status: DC
  Administered 2022-10-20 (×3): 5000 [IU] via SUBCUTANEOUS
  Filled 2022-10-20 (×7): qty 1

## 2022-10-20 MED ORDER — ACETAMINOPHEN 650 MG RE SUPP: 650.0000 mg | Freq: Four times a day (QID) | RECTAL | Status: DC | PRN

## 2022-10-20 MED ORDER — ONDANSETRON HCL 4 MG PO TABS: 4.0000 mg | ORAL_TABLET | Freq: Four times a day (QID) | ORAL | Status: DC | PRN

## 2022-10-20 MED ORDER — MAGNESIUM SULFATE 2 GM/50ML IV SOLN: 2.0000 g | Freq: Once | INTRAVENOUS | Status: DC

## 2022-10-20 MED ORDER — SODIUM CHLORIDE 0.9 % IV BOLUS (SEPSIS)
1000.0000 mL | Freq: Once | INTRAVENOUS | Status: AC
  Administered 2022-10-20: 1000 mL via INTRAVENOUS

## 2022-10-20 MED ORDER — ADULT MULTIVITAMIN W/MINERALS CH
1.0000 | ORAL_TABLET | Freq: Every day | ORAL | Status: DC
  Administered 2022-10-20 – 2022-10-22 (×3): 1 via ORAL
  Filled 2022-10-20 (×3): qty 1

## 2022-10-20 MED ORDER — POTASSIUM CHLORIDE CRYS ER 20 MEQ PO TBCR
40.0000 meq | EXTENDED_RELEASE_TABLET | Freq: Once | ORAL | Status: AC
  Administered 2022-10-20: 40 meq via ORAL
  Filled 2022-10-20: qty 2

## 2022-10-20 MED ORDER — ONDANSETRON HCL 4 MG/2ML IJ SOLN: 4.0000 mg | Freq: Four times a day (QID) | INTRAMUSCULAR | Status: DC | PRN

## 2022-10-20 MED ORDER — METHYLPREDNISOLONE SODIUM SUCC 125 MG IJ SOLR
125.0000 mg | Freq: Once | INTRAMUSCULAR | Status: AC
  Administered 2022-10-20: 125 mg via INTRAVENOUS
  Filled 2022-10-20: qty 2

## 2022-10-20 MED ORDER — LORAZEPAM 1 MG PO TABS: 1.0000 mg | ORAL_TABLET | ORAL | Status: DC | PRN

## 2022-10-20 MED ORDER — THIAMINE HCL 100 MG/ML IJ SOLN
100.0000 mg | Freq: Every day | INTRAMUSCULAR | Status: DC
  Filled 2022-10-20 (×2): qty 2

## 2022-10-20 MED ORDER — LORAZEPAM 2 MG/ML IJ SOLN: 1.0000 mg | INTRAMUSCULAR | Status: DC | PRN

## 2022-10-20 MED ORDER — PROCHLORPERAZINE EDISYLATE 10 MG/2ML IJ SOLN
5.0000 mg | Freq: Once | INTRAMUSCULAR | Status: AC
  Administered 2022-10-20: 5 mg via INTRAVENOUS
  Filled 2022-10-20: qty 2

## 2022-10-20 MED ORDER — VITAMIN B-12 1000 MCG PO TABS
1000.0000 ug | ORAL_TABLET | Freq: Every day | ORAL | Status: DC
  Administered 2022-10-20 – 2022-10-22 (×3): 1000 ug via ORAL
  Filled 2022-10-20 (×3): qty 1

## 2022-10-20 NOTE — Assessment & Plan Note (Addendum)
Ongoing. LFTs with minimal elevation in EtOH pattern. CIWA -Patient endorses drinking approximately 1/2 gallon of liquor every 3 days

## 2022-10-20 NOTE — Assessment & Plan Note (Addendum)
-   Repleted 

## 2022-10-20 NOTE — Assessment & Plan Note (Addendum)
-   Due to chronic alcohol use - Patient endorses taking folate and B12 outpatient; continue supplements while hospitalized

## 2022-10-20 NOTE — Assessment & Plan Note (Addendum)
-   patient has history of CKD3b. Baseline creat ~ 1.7, eGFR 33 - patient presents with increase in creat >0.3 mg/dL above baseline, creat increase >1.5x baseline presumed to have occurred within past 7 days PTA -Creatinine 5.9, BUN 43 on admission - differential included AIN on admission with some NSAID use and cephalosporin use recently however now eos on urine or cbc. Other considered differential still prerenal (prolonged) with possible ATN vs less likely obstruction vs other process such as autoimmune - check further urine studies as ordered - check renal u/s - continue IVF
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
## 2022-10-20 NOTE — Assessment & Plan Note (Signed)
-   Mild and asymptomatic at this time.  Etiology presumed due to chronic alcohol use -Sodium remained stable during hospitalization

## 2022-10-20 NOTE — Assessment & Plan Note (Signed)
-   Repleted 

## 2022-10-20 NOTE — Discharge Instructions (Addendum)
Outpatient Providers   [facility] ([facility]) Group and individual counseling. [address]  [place], [place] [address] [phone] [place]: [phone]  [place]: [phone] Medicaid and uninsured.   [facility] groups multiple times per week. [place], [place], [place] [address] [phone] Takes Medicaid and other insurances.   [facility] Dependency Intensive Outpatient Program (IOP) [address] [place], [place] [address] [phone] Takes Pharmacist, community and [place].   [facility]  IOP and Partial Hospitalization Program  [place].  [place], [place] [address] [phone] Private Insurance, [place] only for partial hospitalization     [facility] ([place]) IOP, individual counseling, medication management [place], [place] [address] [phone] Medicaid and [facility]  [place] [address]  [place], [place] [address] [phone] Private Insurance and [facility] [address]  [place], [place] [address] [phone] Private Insurance, [place], and Self Pay   [facility]: Methadone Clinic  [place], [place] [address] [facility]  [address]  [place], [place] [address] [phone]  [facility]  [address] [place], [place] [address] [phone]       Residential Treatment Programs  [facility] ([place], [place] [address] [phone] or [phone] Detox and Residential Rehab 14 days (Medicare, Medicaid, private insurance, and self pay)  [facility] [place], [place] [address] [phone] Detox (self Pay and Medicaid Limited availability) Rehab Only Female (Medicare, [place],  and Self Pay)  [facility] [address] [place], [place] [address] [phone] or [phone] Private Insurance only  [place]  [place]  [place], [place] [address] [phone] Treatment Only, must make assessment appointment, and must be sober for assessment appointment. Self pay, Medicare A and B, [facility], must be [facility] resident.      Residential Treatment [facility] , [place]  [phone] [employer], [place], [place]. They offer assistance with transportation.   [facility] [address] [place],  [place], [place] [address] [phone] [facility] No insurance     [place], [place] [address] [phone] No pending legal charges, Long-term work program  [place]: [facility]  [place], [place] [address] [address], [place], [place] [address] Residential treatment (takes people on Methadone/Suboxone)  Medicaid and uninsured  [facility]  [address] [place], [place] [address] [phone] or [phone] [employer] Insurance Only [place] [phone] Private Insurance Males/Females, call to make referrals, multiple facilities   [facility] [address]  [place], [place] [address]  [phone] Men Only Upfront Fee [facility] [address]      [facility] Women[facility] Program: [facility] [place], [place] [address] [phone]  [place], [place] [address] [phone] (f)  [place], [place] [address] [phone] (f)       Syringe Services Program Due to COVID-19, syringe services programs are likely operating under different hours with  limited or no fixed site hours. Some programs may not be operating at all. Please contact the program directly using the phone numbers provided below to see if they are still operating under COVID-19.  [facility] Solution to the Opioid Problem (GCSTOP) Fixed; mobile; peer-based [place]idge Aver [phone] [email or web address] Fixed site exchange at [facility], [place]. [place], [place] [address] on Wednesdays (2:00 - 5:00 pm) and Thursdays (4:00 - 8:00 pm). Pop-up mobile exchange locations: [employer] and [employer], [address] [place], [place] [address] on Tuesdays (11:00 am - 1:00 pm) and Fridays (11:00 am - 1:00 pm) [place] [address], [place], [place] [address] on Tuesdays (2:00 - 4:00 pm) and Fridays (2:00 - 4:00 pm) [facility] - also serves [place] and [employer] [place] [employer] Fixed; mobile; peer-based Rosemary Holms [phone] [email or web address] [address] [place], [place] [address] Delivery and outreach available in [place] and [place], please call for more information. Monday, Tuesday: 1:00 -7:00 pm Thursday: 4:00 pm - 8:00 pm Friday: 1:00 pm - 8:00 pm)

## 2022-10-20 NOTE — Progress Notes (Signed)
Progress Note    Kelsey Poole   [id number]  DOB: Jul 25, 1961  DOA: 10/19/2022     0 PCP: Laurey Morale, MD  Initial CC: Headache and malaise  Hospital Course: [name] is a 62 yo female with PMH ongoing etoh use (1/2 gallon liquor every 3 days), HTN, asthma who presented with a right-sided headache and generalized malaise with associated poor oral intake. She had been eating less due to feeling bad and continued to try and drink alcohol but was diluting it so as to prevent withdrawal. Due to right-sided headache, there was some concern for possible arteritis and she was given dose of Solu-Medrol in the ER.  CRP and ESR were not elevated after evaluation and GCA was considered less likely and steroids were not continued. Further workup also revealed worsened renal function and she was admitted for fluids and further evaluation.  Interval History:  Headache is better this morning when seen.  Unclear if steroids helped versus fluids but she does not appear to have arteritis concern at this time. Patient endorses ongoing alcohol use.  Currently no signs of alcohol withdrawal but will need to be monitored.  Assessment and Plan: * Acute renal failure superimposed on stage 3b chronic kidney disease ([place]) - patient has history of CKD3b. Baseline creat ~ 1.7, eGFR 33 - patient presents with increase in creat >0.3 mg/dL above baseline, creat increase >1.5x baseline presumed to have occurred within past 7 days PTA -Creatinine 5.9, BUN 43 on admission - differential included AIN on admission with some NSAID use and cephalosporin use recently however now eos on urine or cbc. Other considered differential still prerenal (prolonged) with possible ATN vs less likely obstruction vs other process such as autoimmune - check further urine studies as ordered - check renal u/s - continue IVF   Alcohol abuse Ongoing. LFTs with minimal elevation in EtOH pattern. CIWA -Patient endorses  drinking approximately 1/2 gallon of liquor every 3 days  Macrocytic anemia - Due to chronic alcohol use - Patient endorses taking folate and B12 outpatient; continue supplements while hospitalized  Hyponatremia - Mild and asymptomatic at this time.  Etiology presumed due to chronic alcohol use - Continue trending BMP  Hypomagnesemia - Replete as needed  Hypokalemia - Replete as needed   Old records reviewed in assessment of this patient  Antimicrobials:   DVT prophylaxis:  heparin injection 5,000 Units Start: 10/20/22 0600   Code Status:   Code Status: Full Code  Mobility Assessment (last 72 hours)     Mobility Assessment     Row Name 10/20/22 0205           Does patient have an order for bedrest or is patient medically unstable No - Continue assessment       What is the highest level of mobility based on the progressive mobility assessment? Level 5 (Walks with assist in room/hall) - Balance while stepping forward/back and can walk in room with assist - Complete                Barriers to discharge:  Disposition Plan: Home 2 to 3 days Status is: Inpatient  Objective: Blood pressure 113/75, pulse 87, temperature 98.5 F (36.9 C), temperature source Oral, resp. rate 18, height 5' 11"$  (1.803 m), weight 66 kg, SpO2 100 %.  Examination:  Physical Exam Constitutional:      General: She is not in acute distress.    Appearance: She is well-developed. She is not ill-appearing.  HENT:  Head: Normocephalic and atraumatic.     Mouth/Throat:     Mouth: Mucous membranes are moist.  Eyes:     Extraocular Movements: Extraocular movements intact.     Pupils: Pupils are equal, round, and reactive to light.  Cardiovascular:     Rate and Rhythm: Normal rate and regular rhythm.  Pulmonary:     Effort: Pulmonary effort is normal. No respiratory distress.     Breath sounds: Normal breath sounds. No wheezing.  Abdominal:     General: Bowel sounds are normal. There  is no distension.     Palpations: Abdomen is soft.     Tenderness: There is no abdominal tenderness.  Musculoskeletal:        General: No swelling. Normal range of motion.     Cervical back: Normal range of motion and neck supple.  Skin:    General: Skin is warm and dry.  Neurological:     General: No focal deficit present.     Mental Status: She is alert.  Psychiatric:        Mood and Affect: Mood normal.        Behavior: Behavior normal.      Consultants:    Procedures:    Data Reviewed: Results for orders placed or performed during the hospital encounter of 10/19/22 (from the past 24 hour(s))  CBG monitoring, ED     Status: None   Collection Time: 10/19/22  8:57 PM  Result Value Ref Range   Glucose-Capillary 87 70 - 99 mg/dL  Comprehensive metabolic panel     Status: Abnormal   Collection Time: 10/19/22  9:03 PM  Result Value Ref Range   Sodium 134 (L) 135 - 145 mmol/L   Potassium 3.2 (L) 3.5 - 5.1 mmol/L   Chloride 95 (L) 98 - 111 mmol/L   CO2 17 (L) 22 - 32 mmol/L   Glucose, Bld 106 (H) 70 - 99 mg/dL   BUN 43 (H) 8 - 23 mg/dL   Creatinine, Ser 5.90 (H) 0.44 - 1.00 mg/dL   Calcium 7.8 (L) 8.9 - 10.3 mg/dL   Total Protein 6.8 6.5 - 8.1 g/dL   Albumin 3.8 3.5 - 5.0 g/dL   AST 124 (H) 15 - 41 U/L   ALT 46 (H) 0 - 44 U/L   Alkaline Phosphatase 112 38 - 126 U/L   Total Bilirubin 0.5 0.3 - 1.2 mg/dL   GFR, Estimated 8 (L) >60 mL/min   Anion gap 22 (H) 5 - 15  CBC     Status: Abnormal   Collection Time: 10/19/22  9:03 PM  Result Value Ref Range   WBC 10.0 4.0 - 10.5 K/uL   RBC 3.02 (L) 3.87 - 5.11 MIL/uL   Hemoglobin 11.0 (L) 12.0 - 15.0 g/dL   HCT 30.7 (L) 36.0 - 46.0 %   MCV 101.7 (H) 80.0 - 100.0 fL   MCH 36.4 (H) 26.0 - 34.0 pg   MCHC 35.8 30.0 - 36.0 g/dL   RDW 13.2 11.5 - 15.5 %   Platelets 221 150 - 400 K/uL   nRBC 0.0 0.0 - 0.2 %  Magnesium     Status: Abnormal   Collection Time: 10/19/22  9:03 PM  Result Value Ref Range   Magnesium 0.9 (LL) 1.7 -  2.4 mg/dL  Urinalysis, Routine w reflex microscopic -Urine, Clean Catch     Status: Abnormal   Collection Time: 10/19/22  9:07 PM  Result Value Ref Range   Color, Urine AMBER (A)  YELLOW   APPearance CLOUDY (A) CLEAR   Specific Gravity, Urine 1.018 1.005 - 1.030   pH 5.0 5.0 - 8.0   Glucose, UA NEGATIVE NEGATIVE mg/dL   Hgb urine dipstick SMALL (A) NEGATIVE   Bilirubin Urine NEGATIVE NEGATIVE   Ketones, ur 5 (A) NEGATIVE mg/dL   Protein, ur 100 (A) NEGATIVE mg/dL   Nitrite NEGATIVE NEGATIVE   Leukocytes,Ua LARGE (A) NEGATIVE   RBC / HPF 21-50 0 - 5 RBC/hpf   WBC, UA >50 0 - 5 WBC/hpf   Bacteria, UA FEW (A) NONE SEEN   Squamous Epithelial / HPF 21-50 0 - 5 /HPF   WBC Clumps PRESENT    Mucus PRESENT    Hyaline Casts, UA PRESENT    Non Squamous Epithelial 0-5 (A) NONE SEEN  Rapid urine drug screen (hospital performed)     Status: None   Collection Time: 10/19/22  9:07 PM  Result Value Ref Range   Opiates NONE DETECTED NONE DETECTED   Cocaine NONE DETECTED NONE DETECTED   Benzodiazepines NONE DETECTED NONE DETECTED   Amphetamines NONE DETECTED NONE DETECTED   Tetrahydrocannabinol NONE DETECTED NONE DETECTED   Barbiturates NONE DETECTED NONE DETECTED  CBG monitoring, ED     Status: Abnormal   Collection Time: 10/19/22 11:56 PM  Result Value Ref Range   Glucose-Capillary 130 (H) 70 - 99 mg/dL  Ethanol     Status: None   Collection Time: 10/20/22 12:26 AM  Result Value Ref Range   Alcohol, Ethyl (B) <10 <10 mg/dL  Sedimentation rate     Status: None   Collection Time: 10/20/22 12:26 AM  Result Value Ref Range   Sed Rate 21 0 - 22 mm/hr  Comprehensive metabolic panel     Status: Abnormal   Collection Time: 10/20/22 12:26 AM  Result Value Ref Range   Sodium 132 (L) 135 - 145 mmol/L   Potassium 3.2 (L) 3.5 - 5.1 mmol/L   Chloride 94 (L) 98 - 111 mmol/L   CO2 17 (L) 22 - 32 mmol/L   Glucose, Bld 104 (H) 70 - 99 mg/dL   BUN 47 (H) 8 - 23 mg/dL   Creatinine, Ser 5.68 (H) 0.44 -  1.00 mg/dL   Calcium 7.3 (L) 8.9 - 10.3 mg/dL   Total Protein 6.0 (L) 6.5 - 8.1 g/dL   Albumin 3.4 (L) 3.5 - 5.0 g/dL   AST 117 (H) 15 - 41 U/L   ALT 40 0 - 44 U/L   Alkaline Phosphatase 95 38 - 126 U/L   Total Bilirubin 0.5 0.3 - 1.2 mg/dL   GFR, Estimated 8 (L) >60 mL/min   Anion gap 21 (H) 5 - 15  Lipase, blood     Status: None   Collection Time: 10/20/22 12:26 AM  Result Value Ref Range   Lipase 34 11 - 51 U/L  Osmolality     Status: None   Collection Time: 10/20/22  3:18 AM  Result Value Ref Range   Osmolality 293 275 - 295 mOsm/kg  C-reactive protein     Status: Abnormal   Collection Time: 10/20/22  3:19 AM  Result Value Ref Range   CRP 1.0 (H) <1.0 mg/dL  Comprehensive metabolic panel     Status: Abnormal   Collection Time: 10/20/22  3:19 AM  Result Value Ref Range   Sodium 132 (L) 135 - 145 mmol/L   Potassium 3.8 3.5 - 5.1 mmol/L   Chloride 96 (L) 98 - 111 mmol/L   CO2  21 (L) 22 - 32 mmol/L   Glucose, Bld 128 (H) 70 - 99 mg/dL   BUN 45 (H) 8 - 23 mg/dL   Creatinine, Ser 5.51 (H) 0.44 - 1.00 mg/dL   Calcium 7.2 (L) 8.9 - 10.3 mg/dL   Total Protein 6.1 (L) 6.5 - 8.1 g/dL   Albumin 3.4 (L) 3.5 - 5.0 g/dL   AST 110 (H) 15 - 41 U/L   ALT 42 0 - 44 U/L   Alkaline Phosphatase 95 38 - 126 U/L   Total Bilirubin 0.4 0.3 - 1.2 mg/dL   GFR, Estimated 8 (L) >60 mL/min   Anion gap 15 5 - 15  CBC with Differential/Platelet     Status: Abnormal   Collection Time: 10/20/22  3:19 AM  Result Value Ref Range   WBC 9.2 4.0 - 10.5 K/uL   RBC 2.59 (L) 3.87 - 5.11 MIL/uL   Hemoglobin 9.2 (L) 12.0 - 15.0 g/dL   HCT 25.9 (L) 36.0 - 46.0 %   MCV 100.0 80.0 - 100.0 fL   MCH 35.5 (H) 26.0 - 34.0 pg   MCHC 35.5 30.0 - 36.0 g/dL   RDW 13.2 11.5 - 15.5 %   Platelets 147 (L) 150 - 400 K/uL   nRBC 0.0 0.0 - 0.2 %   Neutrophils Relative % 93 %   Neutro Abs 8.6 (H) 1.7 - 7.7 K/uL   Lymphocytes Relative 5 %   Lymphs Abs 0.4 (L) 0.7 - 4.0 K/uL   Monocytes Relative 2 %   Monocytes  Absolute 0.2 0.1 - 1.0 K/uL   Eosinophils Relative 0 %   Eosinophils Absolute 0.0 0.0 - 0.5 K/uL   Basophils Relative 0 %   Basophils Absolute 0.0 0.0 - 0.1 K/uL   Immature Granulocytes 0 %   Abs Immature Granulocytes 0.03 0.00 - 0.07 K/uL  Magnesium     Status: Abnormal   Collection Time: 10/20/22  3:19 AM  Result Value Ref Range   Magnesium 1.6 (L) 1.7 - 2.4 mg/dL    I have reviewed pertinent nursing notes, vitals, labs, and images as necessary. I have ordered labwork to follow up on as indicated.  I have reviewed the last notes from staff over past 24 hours. I have discussed patient's care plan and test results with nursing staff, CM/SW, and other staff as appropriate.  Time spent: Greater than 50% of the 55 minute visit was spent in counseling/coordination of care for the patient as laid out in the A&P.   LOS: 0 days   Dwyane Dee, MD [facility] 10/20/2022, 1:22 PM

## 2022-10-20 NOTE — Progress Notes (Signed)
NEW ADMISSION NOTE New Admission Note:   Arrival Method: Stretcher from ED Mental Orientation: Alert and Oriented x4 Telemetry: Tele Box #1 Assessment: Completed Skin: Redness under right breast IV: Left A.C, saline locked Tubes: None Safety Measures: Safety Fall Prevention Plan has been given, discussed and implemented. Admission: Initiated [facility] Orientation: Patient has been orientated to the room, unit and staff.  Family: Husband present at the bedside  Orders have been reviewed and implemented. Will continue to monitor the patient. Call light has been placed within reach and bed alarm has been activated.   Sharmon Revere, RN
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
## 2022-10-20 NOTE — Hospital Course (Signed)
Kelsey Poole is a 62 yo female with PMH ongoing etoh use (1/2 gallon liquor every 3 days), HTN, asthma who presented with a right-sided headache and generalized malaise with associated poor oral intake. She had been eating less due to feeling bad and continued to try and drink alcohol but was diluting it so as to prevent withdrawal. Due to right-sided headache, there was some concern for possible arteritis and she was given dose of Solu-Medrol in the ER.  CRP and ESR were not elevated after evaluation and GCA was considered less likely and steroids were not continued. Further workup also revealed worsened renal function and she was admitted for fluids and further evaluation.
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
## 2022-10-21 ENCOUNTER — Inpatient Hospital Stay (HOSPITAL_COMMUNITY)

## 2022-10-21 DIAGNOSIS — N1832 Chronic kidney disease, stage 3b: Secondary | ICD-10-CM | POA: Diagnosis not present

## 2022-10-21 DIAGNOSIS — N179 Acute kidney failure, unspecified: Secondary | ICD-10-CM | POA: Diagnosis not present

## 2022-10-21 LAB — BASIC METABOLIC PANEL
Anion gap: 11 (ref 5–15)
BUN: 59 mg/dL — ABNORMAL HIGH (ref 8–23)
CO2: 21 mmol/L — ABNORMAL LOW (ref 22–32)
Calcium: 7.5 mg/dL — ABNORMAL LOW (ref 8.9–10.3)
Chloride: 99 mmol/L (ref 98–111)
Creatinine, Ser: 4.32 mg/dL — ABNORMAL HIGH (ref 0.44–1.00)
GFR, Estimated: 11 mL/min — ABNORMAL LOW (ref >60)
Glucose, Bld: 119 mg/dL — ABNORMAL HIGH (ref 70–99)
Potassium: 3.8 mmol/L (ref 3.5–5.1)
Sodium: 131 mmol/L — ABNORMAL LOW (ref 135–145)

## 2022-10-21 LAB — URINE CULTURE: Culture: <10000 — AB

## 2022-10-21 LAB — CBC WITH DIFFERENTIAL/PLATELET
Abs Immature Granulocytes: 0.03 10*3/uL (ref 0.00–0.07)
Basophils Absolute: 0 10*3/uL (ref 0.0–0.1)
Basophils Relative: 0 %
Eosinophils Absolute: 0.1 10*3/uL (ref 0.0–0.5)
Eosinophils Relative: 1 %
HCT: 23.4 % — ABNORMAL LOW (ref 36.0–46.0)
Hemoglobin: 7.9 g/dL — ABNORMAL LOW (ref 12.0–15.0)
Immature Granulocytes: 0 %
Lymphocytes Relative: 23 %
Lymphs Abs: 1.7 10*3/uL (ref 0.7–4.0)
MCH: 35.6 pg — ABNORMAL HIGH (ref 26.0–34.0)
MCHC: 33.8 g/dL (ref 30.0–36.0)
MCV: 105.4 fL — ABNORMAL HIGH (ref 80.0–100.0)
Monocytes Absolute: 0.5 10*3/uL (ref 0.1–1.0)
Monocytes Relative: 7 %
Neutro Abs: 5 10*3/uL (ref 1.7–7.7)
Neutrophils Relative %: 69 %
Platelets: 141 10*3/uL — ABNORMAL LOW (ref 150–400)
RBC: 2.22 MIL/uL — ABNORMAL LOW (ref 3.87–5.11)
RDW: 13.2 % (ref 11.5–15.5)
WBC: 7.3 10*3/uL (ref 4.0–10.5)
nRBC: 0 % (ref 0.0–0.2)

## 2022-10-21 LAB — MAGNESIUM: Magnesium: 1.9 mg/dL (ref 1.7–2.4)

## 2022-10-21 LAB — UREA NITROGEN, URINE: Urea Nitrogen, Ur: 453 mg/dL

## 2022-10-21 MED ORDER — DIPHENHYDRAMINE-ZINC ACETATE 2-0.1 % EX CREA
TOPICAL_CREAM | Freq: Two times a day (BID) | CUTANEOUS | Status: DC | PRN
  Filled 2022-10-21: qty 28

## 2022-10-21 MED ORDER — HYDROXYZINE HCL 10 MG PO TABS: 10.0000 mg | ORAL_TABLET | Freq: Three times a day (TID) | ORAL | Status: DC | PRN

## 2022-10-21 NOTE — Assessment & Plan Note (Signed)
-   patient found smoking outside today and assisted back to room per security; hospital tobacco policy explained  - may offer nicotine patch

## 2022-10-21 NOTE — Plan of Care (Signed)
  Problem: Education: Goal: Knowledge of General Education information will improve Description Including pain rating scale, medication(s)/side effects and non-pharmacologic comfort measures Outcome: Progressing   

## 2022-10-21 NOTE — Progress Notes (Signed)
Progress Note    Kelsey Poole   [id number]  DOB: 01/25/1961  DOA: 10/19/2022     1 PCP: Laurey Morale, MD  Initial CC: Headache and malaise  Hospital Course: Ms. Suppa is a 62 yo female with PMH ongoing etoh use (1/2 gallon liquor every 3 days), HTN, asthma who presented with a right-sided headache and generalized malaise with associated poor oral intake. She had been eating less due to feeling bad and continued to try and drink alcohol but was diluting it so as to prevent withdrawal. Due to right-sided headache, there was some concern for possible arteritis and she was given dose of Solu-Medrol in the ER.  CRP and ESR were not elevated after evaluation and GCA was considered less likely and steroids were not continued. Further workup also revealed worsened renal function and she was admitted for fluids and further evaluation.  Interval History:  No events overnight.  Patient found this afternoon outside smoking and was escorted back to her room.  Assessment and Plan: * Acute renal failure superimposed on stage 3b chronic kidney disease ([place]) - patient has history of CKD3b. Baseline creat ~ 1.7, eGFR 33 - patient presents with increase in creat >0.3 mg/dL above baseline, creat increase >1.5x baseline presumed to have occurred within past 7 days PTA -Creatinine 5.9, BUN 43 on admission - differential included AIN on admission with some NSAID use and cephalosporin use recently however now eos on urine or cbc. Other considered differential still prerenal (prolonged) with possible ATN vs less likely obstruction vs other process such as autoimmune - FeNa 0.2% consistent with pre-renal; continue IVF -Renal ultrasound unremarkable   Alcohol abuse Ongoing. LFTs with minimal elevation in EtOH pattern. CIWA -Patient endorses drinking approximately 1/2 gallon of liquor every 3 days  Macrocytic anemia - Due to chronic alcohol use - Patient endorses taking folate and B12  outpatient; continue supplements while hospitalized  Hyponatremia - Mild and asymptomatic at this time.  Etiology presumed due to chronic alcohol use - Continue trending BMP  Hypomagnesemia - Replete as needed  Hypokalemia - Replete as needed  Tobacco dependence - patient found smoking outside today and assisted back to room per security; hospital tobacco policy explained  - may offer nicotine patch   Old records reviewed in assessment of this patient  Antimicrobials:   DVT prophylaxis:  heparin injection 5,000 Units Start: 10/20/22 0600   Code Status:   Code Status: Full Code  Mobility Assessment (last 72 hours)     Mobility Assessment     Row Name 10/21/22 0936 10/20/22 2030 10/20/22 0205       Does patient have an order for bedrest or is patient medically unstable No - Continue assessment No - Continue assessment No - Continue assessment     What is the highest level of mobility based on the progressive mobility assessment? Level 5 (Walks with assist in room/hall) - Balance while stepping forward/back and can walk in room with assist - Complete Level 5 (Walks with assist in room/hall) - Balance while stepping forward/back and can walk in room with assist - Complete Level 5 (Walks with assist in room/hall) - Balance while stepping forward/back and can walk in room with assist - Complete              Barriers to discharge:  Disposition Plan: Home 2 to 3 days Status is: Inpatient  Objective: Blood pressure (!) 141/88, pulse 84, temperature 98 F (36.7 C), temperature source Oral, resp. rate  18, height 5' 11"$  (1.803 m), weight 66 kg, SpO2 100 %.  Examination:  Physical Exam Constitutional:      General: She is not in acute distress.    Appearance: She is well-developed. She is not ill-appearing.  HENT:     Head: Normocephalic and atraumatic.     Mouth/Throat:     Mouth: Mucous membranes are moist.  Eyes:     Extraocular Movements: Extraocular movements  intact.     Pupils: Pupils are equal, round, and reactive to light.  Cardiovascular:     Rate and Rhythm: Normal rate and regular rhythm.  Pulmonary:     Effort: Pulmonary effort is normal. No respiratory distress.     Breath sounds: Normal breath sounds. No wheezing.  Abdominal:     General: Bowel sounds are normal. There is no distension.     Palpations: Abdomen is soft.     Tenderness: There is no abdominal tenderness.  Musculoskeletal:        General: No swelling. Normal range of motion.     Cervical back: Normal range of motion and neck supple.  Skin:    General: Skin is warm and dry.  Neurological:     General: No focal deficit present.     Mental Status: She is alert.  Psychiatric:        Mood and Affect: Mood normal.        Behavior: Behavior normal.      Consultants:    Procedures:    Data Reviewed: Results for orders placed or performed during the hospital encounter of 10/19/22 (from the past 24 hour(s))  Basic metabolic panel     Status: Abnormal   Collection Time: 10/21/22  1:58 AM  Result Value Ref Range   Sodium 131 (L) 135 - 145 mmol/L   Potassium 3.8 3.5 - 5.1 mmol/L   Chloride 99 98 - 111 mmol/L   CO2 21 (L) 22 - 32 mmol/L   Glucose, Bld 119 (H) 70 - 99 mg/dL   BUN 59 (H) 8 - 23 mg/dL   Creatinine, Ser 4.32 (H) 0.44 - 1.00 mg/dL   Calcium 7.5 (L) 8.9 - 10.3 mg/dL   GFR, Estimated 11 (L) >60 mL/min   Anion gap 11 5 - 15  CBC with Differential/Platelet     Status: Abnormal   Collection Time: 10/21/22  1:58 AM  Result Value Ref Range   WBC 7.3 4.0 - 10.5 K/uL   RBC 2.22 (L) 3.87 - 5.11 MIL/uL   Hemoglobin 7.9 (L) 12.0 - 15.0 g/dL   HCT 23.4 (L) 36.0 - 46.0 %   MCV 105.4 (H) 80.0 - 100.0 fL   MCH 35.6 (H) 26.0 - 34.0 pg   MCHC 33.8 30.0 - 36.0 g/dL   RDW 13.2 11.5 - 15.5 %   Platelets 141 (L) 150 - 400 K/uL   nRBC 0.0 0.0 - 0.2 %   Neutrophils Relative % 69 %   Neutro Abs 5.0 1.7 - 7.7 K/uL   Lymphocytes Relative 23 %   Lymphs Abs 1.7 0.7 -  4.0 K/uL   Monocytes Relative 7 %   Monocytes Absolute 0.5 0.1 - 1.0 K/uL   Eosinophils Relative 1 %   Eosinophils Absolute 0.1 0.0 - 0.5 K/uL   Basophils Relative 0 %   Basophils Absolute 0.0 0.0 - 0.1 K/uL   Immature Granulocytes 0 %   Abs Immature Granulocytes 0.03 0.00 - 0.07 K/uL  Magnesium     Status: None  Collection Time: 10/21/22  1:58 AM  Result Value Ref Range   Magnesium 1.9 1.7 - 2.4 mg/dL    I have reviewed pertinent nursing notes, vitals, labs, and images as necessary. I have ordered labwork to follow up on as indicated.  I have reviewed the last notes from staff over past 24 hours. I have discussed patient's care plan and test results with nursing staff, CM/SW, and other staff as appropriate.  Time spent: Greater than 50% of the 55 minute visit was spent in counseling/coordination of care for the patient as laid out in the A&P.   LOS: 1 day   Dwyane Dee, MD [facility] 10/21/2022, 2:48 PM

## 2022-10-21 NOTE — Progress Notes (Signed)
Patient walked out of her room and got on elevator. Security was called and patient was found outside smoking a cigarette. It was explained to patient that she could not go outside to smoke. Patient returned to room.

## 2022-10-22 DIAGNOSIS — N1832 Chronic kidney disease, stage 3b: Secondary | ICD-10-CM | POA: Diagnosis not present

## 2022-10-22 DIAGNOSIS — N179 Acute kidney failure, unspecified: Secondary | ICD-10-CM | POA: Diagnosis not present

## 2022-10-22 DIAGNOSIS — F1010 Alcohol abuse, uncomplicated: Secondary | ICD-10-CM | POA: Diagnosis not present

## 2022-10-22 LAB — BASIC METABOLIC PANEL
Anion gap: 9 (ref 5–15)
BUN: 55 mg/dL — ABNORMAL HIGH (ref 8–23)
CO2: 21 mmol/L — ABNORMAL LOW (ref 22–32)
Calcium: 7.7 mg/dL — ABNORMAL LOW (ref 8.9–10.3)
Chloride: 102 mmol/L (ref 98–111)
Creatinine, Ser: 2.91 mg/dL — ABNORMAL HIGH (ref 0.44–1.00)
GFR, Estimated: 18 mL/min — ABNORMAL LOW (ref >60)
Glucose, Bld: 122 mg/dL — ABNORMAL HIGH (ref 70–99)
Potassium: 3.9 mmol/L (ref 3.5–5.1)
Sodium: 132 mmol/L — ABNORMAL LOW (ref 135–145)

## 2022-10-22 LAB — CBC WITH DIFFERENTIAL/PLATELET
Abs Immature Granulocytes: 0.02 10*3/uL (ref 0.00–0.07)
Basophils Absolute: 0 10*3/uL (ref 0.0–0.1)
Basophils Relative: 1 %
Eosinophils Absolute: 0.1 10*3/uL (ref 0.0–0.5)
Eosinophils Relative: 1 %
HCT: 21.2 % — ABNORMAL LOW (ref 36.0–46.0)
Hemoglobin: 7.4 g/dL — ABNORMAL LOW (ref 12.0–15.0)
Immature Granulocytes: 0 %
Lymphocytes Relative: 31 %
Lymphs Abs: 1.6 10*3/uL (ref 0.7–4.0)
MCH: 35.7 pg — ABNORMAL HIGH (ref 26.0–34.0)
MCHC: 34.9 g/dL (ref 30.0–36.0)
MCV: 102.4 fL — ABNORMAL HIGH (ref 80.0–100.0)
Monocytes Absolute: 0.3 10*3/uL (ref 0.1–1.0)
Monocytes Relative: 6 %
Neutro Abs: 3.1 10*3/uL (ref 1.7–7.7)
Neutrophils Relative %: 61 %
Platelets: 148 10*3/uL — ABNORMAL LOW (ref 150–400)
RBC: 2.07 MIL/uL — ABNORMAL LOW (ref 3.87–5.11)
RDW: 13 % (ref 11.5–15.5)
WBC: 5.2 10*3/uL (ref 4.0–10.5)
nRBC: 0 % (ref 0.0–0.2)

## 2022-10-22 LAB — MAGNESIUM: Magnesium: 1.4 mg/dL — ABNORMAL LOW (ref 1.7–2.4)

## 2022-10-22 MED ORDER — MAGNESIUM SULFATE 4 GM/100ML IV SOLN
4.0000 g | Freq: Once | INTRAVENOUS | Status: AC
  Administered 2022-10-22: 4 g via INTRAVENOUS
  Filled 2022-10-22: qty 100

## 2022-10-22 MED ORDER — ALBUTEROL SULFATE (2.5 MG/3ML) 0.083% IN NEBU: 2.5000 mg | INHALATION_SOLUTION | Freq: Every day | RESPIRATORY_TRACT | Status: DC | PRN

## 2022-10-22 MED ORDER — CYANOCOBALAMIN 1000 MCG PO TABS: 1000.0000 ug | ORAL_TABLET | Freq: Every day | ORAL | Status: DC

## 2022-10-22 MED ORDER — ALBUTEROL SULFATE HFA 108 (90 BASE) MCG/ACT IN AERS: 1.0000 | INHALATION_SPRAY | Freq: Four times a day (QID) | RESPIRATORY_TRACT | Status: DC | PRN

## 2022-10-22 MED ORDER — KETOCONAZOLE 2 % EX SHAM: 1.0000 | MEDICATED_SHAMPOO | CUTANEOUS | 0 refills | Status: DC

## 2022-10-22 NOTE — TOC Transition Note (Signed)
Transition of Care [facility] - CM/SW Discharge Note   Patient Details  Name: Kelsey Poole MRN: [id number] Date of Birth: 1961-06-03  Transition of Care [facility]) CM/SW Contact:  Zenon Mayo, RN Phone Number: 10/22/2022, 9:34 AM   Clinical Narrative:    For DC today, no needs.   Final next level of care: Home/Self Care Barriers to Discharge: No Barriers Identified   Patient Goals and CMS Choice   Choice offered to / list presented to : NA  Discharge Placement                         Discharge Plan and Services Additional resources added to the After Visit Summary for   In-house Referral: NA Discharge Planning Services: CM Consult Post Acute Care Choice: NA          DME Arranged: N/A DME Agency: NA       HH Arranged: NA          Social Determinants of Health (SDOH) Interventions SDOH Screenings   Transportation Needs: Unmet Transportation Needs (10/17/2018)  Depression (PHQ2-9): High Risk (09/20/2022)  Tobacco Use: High Risk (10/19/2022)     Readmission Risk Interventions    10/22/2022    9:32 AM  Readmission Risk Prevention Plan  Transportation Screening Complete  HRI or [place] Complete  Social Work Consult for [place] Planning/Counseling Complete  Palliative Care Screening Not Applicable  Medication Review [employer]) Complete

## 2022-10-22 NOTE — Progress Notes (Signed)
DISCHARGE NOTE HOME Kelsey Poole to be discharged Home per MD order. Discussed prescriptions and follow up appointments with the patient. Prescriptions given to patient; medication list explained in detail. Patient verbalized understanding.  Skin clean, dry and intact without evidence of skin break down, no evidence of skin tears noted. IV catheter discontinued intact. Site without signs and symptoms of complications. Dressing and pressure applied. Pt denies pain at the site currently. No complaints noted.  Patient free of lines, drains, and wounds.   An After Visit Summary (AVS) was printed and given to the patient. Pt. States she is going to take the bus home. Pt. Declined escort to main lobby via w/c, states she wants to walk. Pt. Left in stable condition with all belongings  Anastasio Auerbach, RN
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
## 2022-10-22 NOTE — Discharge Summary (Signed)
Physician Discharge Summary   Kelsey Poole [id number] DOB: May 02, 1961 DOA: 10/19/2022  PCP: Laurey Morale, MD  Admit date: 10/19/2022 Discharge date: 10/22/2022   Admitted From: Home Disposition:  Home Discharging physician: Dwyane Dee, MD Barriers to discharge: none  Recommendations for Outpatient Follow-up:  Patient recommended to continue etoh abstinence or cutting back extremely    Discharge Condition: stable CODE STATUS: Full Diet recommendation:  Diet Orders (From admission, onward)     Start     Ordered   10/22/22 0000  Diet general        10/22/22 0922   10/20/22 0255  Diet Heart Room service appropriate? Yes; Fluid consistency: Thin  Diet effective now       Question Answer Comment  Room service appropriate? Yes   Fluid consistency: Thin      10/20/22 0255            Hospital Course: Kelsey Poole is a 62 yo female with PMH ongoing etoh use (1/2 gallon liquor every 3 days), HTN, asthma who presented with a right-sided headache and generalized malaise with associated poor oral intake. She had been eating less due to feeling bad and continued to try and drink alcohol but was diluting it so as to prevent withdrawal. Due to right-sided headache, there was some concern for possible arteritis and she was given dose of Solu-Medrol in the ER.  CRP and ESR were not elevated after evaluation and GCA was considered less likely and steroids were not continued. Further workup also revealed worsened renal function and she was admitted for fluids and further evaluation.  Assessment and Plan: * Acute renal failure superimposed on stage 3b chronic kidney disease ([place]) - patient has history of CKD3b. Baseline creat ~ 1.7, eGFR 33 - patient presents with increase in creat >0.3 mg/dL above baseline, creat increase >1.5x baseline presumed to have occurred within past 7 days PTA -Creatinine 5.9, BUN 43 on admission - differential included AIN on admission with some NSAID  use and cephalosporin use recently however now eos on urine or cbc. Other considered differential still prerenal (prolonged) with possible ATN vs less likely obstruction vs other process such as autoimmune - FeNa 0.2% consistent with pre-renal -Renal ultrasound unremarkable -Creatinine responded well to fluids and discharged with creatinine down to 2.9.  Patient informed to continue hydrating at discharge and to continue abstaining from alcohol   Alcohol abuse LFTs with minimal elevation in EtOH pattern.  No significant signs of withdrawal during hospitalization -Patient endorses drinking approximately 1/2 gallon of liquor every 3 days  Macrocytic anemia - Due to chronic alcohol use - Patient endorses taking folate and B12 outpatient; continue supplements while hospitalized  Hyponatremia - Mild and asymptomatic at this time.  Etiology presumed due to chronic alcohol use -Sodium remained stable during hospitalization  Hypomagnesemia - Repleted  Hypokalemia - Repleted  Tobacco dependence - patient found smoking outside 10/21/2022 and assisted back to room per security; hospital tobacco policy explained    The patient's chronic medical conditions were treated accordingly per the patient's home medication regimen except as noted.  On day of discharge, patient was felt deemed stable for discharge. Patient/family member advised to call PCP or come back to ER if needed.   Principal Diagnosis: Acute renal failure superimposed on stage 3b chronic kidney disease [facility])  Discharge Diagnoses: Active Hospital Problems   Diagnosis Date Noted   Acute renal failure superimposed on stage 3b chronic kidney disease ([place]) 10/20/2022    Priority: 1.  Alcohol abuse 04/23/2020    Priority: 2.   Hypokalemia 10/20/2022   Hypomagnesemia 10/20/2022   Hyponatremia 10/20/2022   Macrocytic anemia 10/20/2022   Tobacco dependence 04/23/2020    Resolved Hospital Problems  No resolved problems to  display.     Discharge Instructions     Diet general   Complete by: As directed    Increase activity slowly   Complete by: As directed       Allergies as of 10/22/2022       Reactions   Percocet [oxycodone-acetaminophen] Itching   Cephalexin Itching, Rash   "Scratched self raw"   Z-pak [azithromycin] Hives        Medication List     TAKE these medications    albuterol 108 (90 Base) MCG/ACT inhaler Commonly known as: VENTOLIN HFA Inhale 2 puffs into the lungs daily as needed for wheezing or shortness of breath.   amLODipine 10 MG tablet Commonly known as: NORVASC Take 1 tablet (10 mg total) by mouth daily.   Claritin 10 MG tablet Generic drug: loratadine Take 10 mg by mouth daily as needed for allergies.   cyanocobalamin 1000 MCG tablet Take 1 tablet (1,000 mcg total) by mouth daily. Start taking on: October 23, 2022   hydrOXYzine 25 MG tablet Commonly known as: ATARAX Take 1 tablet (25 mg total) by mouth every 6 (six) hours.   ketoconazole 2 % shampoo Commonly known as: NIZORAL Apply 1 Application topically 3 (three) times a week. Start taking on: October 23, 2022   neomycin-polymyxin-hydrocortisone 3.5-10000-1 OTIC suspension Commonly known as: CORTISPORIN Place 4 drops into the left ear 3 (three) times daily.   sertraline 50 MG tablet Commonly known as: ZOLOFT Take 1 tablet (50 mg total) by mouth daily. What changed:  when to take this reasons to take this   triamcinolone cream 0.1 % Commonly known as: KENALOG Apply 1 Application topically 2 (two) times daily. What changed:  when to take this reasons to take this        Allergies  Allergen Reactions   Percocet [Oxycodone-Acetaminophen] Itching   Cephalexin Itching and Rash    "Scratched self raw"   Z-Pak [Azithromycin] Hives    Consultations:   Procedures:   Discharge Exam: BP (!) 152/90 (BP Location: Left Arm)   Pulse 82   Temp 98 F (36.7 C) (Oral)   Resp 18   Ht 5'  11" (1.803 m)   Wt 66 kg   SpO2 98%   BMI 20.29 kg/m  Physical Exam Constitutional:      General: She is not in acute distress.    Appearance: She is well-developed. She is not ill-appearing.  HENT:     Head: Normocephalic and atraumatic.     Mouth/Throat:     Mouth: Mucous membranes are moist.  Eyes:     Extraocular Movements: Extraocular movements intact.     Pupils: Pupils are equal, round, and reactive to light.  Cardiovascular:     Rate and Rhythm: Normal rate and regular rhythm.  Pulmonary:     Effort: Pulmonary effort is normal. No respiratory distress.     Breath sounds: Normal breath sounds. No wheezing.  Abdominal:     General: Bowel sounds are normal. There is no distension.     Palpations: Abdomen is soft.     Tenderness: There is no abdominal tenderness.  Musculoskeletal:        General: No swelling. Normal range of motion.     Cervical back: Normal  range of motion and neck supple.  Skin:    General: Skin is warm and dry.  Neurological:     General: No focal deficit present.     Mental Status: She is alert.  Psychiatric:        Mood and Affect: Mood normal.        Behavior: Behavior normal.      The results of significant diagnostics from this hospitalization (including imaging, microbiology, ancillary and laboratory) are listed below for reference.   Microbiology: Recent Results (from the past 240 hour(s))  Urine Culture (for pregnant, neutropenic or urologic patients or patients with an indwelling urinary catheter)     Status: Abnormal   Collection Time: 10/20/22  1:42 PM   Specimen: Urine, Clean Catch  Result Value Ref Range Status   Specimen Description URINE, CLEAN CATCH  Final   Special Requests NONE  Final   Culture (A)  Final    <10,000 COLONIES/mL INSIGNIFICANT GROWTH Performed at [facility], [address] [place], [place] [address]    Report Status 10/21/2022 FINAL  Final     Labs: BNP (last 3 results) No results for input(s):  "BNP" in the last 8760 hours. Basic Metabolic Panel: Recent Labs  Lab 10/19/22 2103 10/20/22 0026 10/20/22 0319 10/21/22 0158 10/22/22 0043  NA 134* 132* 132* 131* 132*  K 3.2* 3.2* 3.8 3.8 3.9  CL 95* 94* 96* 99 102  CO2 17* 17* 21* 21* 21*  GLUCOSE 106* 104* 128* 119* 122*  BUN 43* 47* 45* 59* 55*  CREATININE 5.90* 5.68* 5.51* 4.32* 2.91*  CALCIUM 7.8* 7.3* 7.2* 7.5* 7.7*  MG 0.9*  --  1.6* 1.9 1.4*   Liver Function Tests: Recent Labs  Lab 10/19/22 2103 10/20/22 0026 10/20/22 0319  AST 124* 117* 110*  ALT 46* 40 42  ALKPHOS 112 95 95  BILITOT 0.5 0.5 0.4  PROT 6.8 6.0* 6.1*  ALBUMIN 3.8 3.4* 3.4*   Recent Labs  Lab 10/20/22 0026  LIPASE 34   No results for input(s): "AMMONIA" in the last 168 hours. CBC: Recent Labs  Lab 10/19/22 2103 10/20/22 0319 10/21/22 0158 10/22/22 0043  WBC 10.0 9.2 7.3 5.2  NEUTROABS  --  8.6* 5.0 3.1  HGB 11.0* 9.2* 7.9* 7.4*  HCT 30.7* 25.9* 23.4* 21.2*  MCV 101.7* 100.0 105.4* 102.4*  PLT 221 147* 141* 148*   Cardiac Enzymes: No results for input(s): "CKTOTAL", "CKMB", "CKMBINDEX", "TROPONINI" in the last 168 hours. BNP: Invalid input(s): "POCBNP" CBG: Recent Labs  Lab 10/19/22 2057 10/19/22 2356  GLUCAP 87 130*   D-Dimer No results for input(s): "DDIMER" in the last 72 hours. Hgb A1c No results for input(s): "HGBA1C" in the last 72 hours. Lipid Profile No results for input(s): "CHOL", "HDL", "LDLCALC", "TRIG", "CHOLHDL", "LDLDIRECT" in the last 72 hours. Thyroid function studies No results for input(s): "TSH", "T4TOTAL", "T3FREE", "THYROIDAB" in the last 72 hours.  Invalid input(s): "FREET3" Anemia work up No results for input(s): "VITAMINB12", "FOLATE", "FERRITIN", "TIBC", "IRON", "RETICCTPCT" in the last 72 hours. Urinalysis    Component Value Date/Time   COLORURINE AMBER (A) 10/19/2022 2107   APPEARANCEUR CLOUDY (A) 10/19/2022 2107   LABSPEC 1.018 10/19/2022 2107   PHURINE 5.0 10/19/2022 2107   GLUCOSEU  NEGATIVE 10/19/2022 2107   GLUCOSEU NEGATIVE 07/13/2022 1150   HGBUR SMALL (A) 10/19/2022 2107   BILIRUBINUR NEGATIVE 10/19/2022 2107   BILIRUBINUR neg 11/30/2020 1345   KETONESUR 5 (A) 10/19/2022 2107   PROTEINUR 100 (A)  10/19/2022 2107   UROBILINOGEN 1.0 07/13/2022 1150   NITRITE NEGATIVE 10/19/2022 2107   LEUKOCYTESUR LARGE (A) 10/19/2022 2107   Sepsis Labs Recent Labs  Lab 10/19/22 2103 10/20/22 0319 10/21/22 0158 10/22/22 0043  WBC 10.0 9.2 7.3 5.2   Microbiology Recent Results (from the past 240 hour(s))  Urine Culture (for pregnant, neutropenic or urologic patients or patients with an indwelling urinary catheter)     Status: Abnormal   Collection Time: 10/20/22  1:42 PM   Specimen: Urine, Clean Catch  Result Value Ref Range Status   Specimen Description URINE, CLEAN CATCH  Final   Special Requests NONE  Final   Culture (A)  Final    <10,000 COLONIES/mL INSIGNIFICANT GROWTH Performed at [facility], [address], [place], [place] [address]    Report Status 10/21/2022 FINAL  Final    Procedures/Studies: US RENAL  Result Date: 10/21/2022 CLINICAL DATA:  [id number] Acute kidney failure [facility]) [id number] EXAM: RENAL / URINARY TRACT ULTRASOUND COMPLETE COMPARISON:  03/06/2014 FINDINGS: The right kidney measured 9.1 cm and the left kidney measured 9.0 cm. The kidneys demonstrate normal echogenicity. No renal parenchymal lesions are identified. No shadowing stones are seen. No hydronephrosis. The urinary bladder was nearly empty and evaluation was limited. IMPRESSION: Unremarkable renal ultrasound. Electronically Signed   By: Sammie Bench M.D.   On: 10/21/2022 08:54   DG Chest Port 1 View  Result Date: 10/20/2022 CLINICAL DATA:  Dyspnea EXAM: PORTABLE CHEST 1 VIEW COMPARISON:  07/22/2013 FINDINGS: The heart size and mediastinal contours are within normal limits. Both lungs are clear. The visualized skeletal structures are unremarkable. IMPRESSION: No active disease.  Electronically Signed   By: Fidela Salisbury M.D.   On: 10/20/2022 00:28   CT Head Wo Contrast  Result Date: 10/19/2022 CLINICAL DATA:  Headaches with increasing frequency or severity. Nausea and vomiting for 2 days. EXAM: CT HEAD WITHOUT CONTRAST TECHNIQUE: Contiguous axial images were obtained from the base of the skull through the vertex without intravenous contrast. RADIATION DOSE REDUCTION: This exam was performed according to the departmental dose-optimization program which includes automated exposure control, adjustment of the mA and/or kV according to patient size and/or use of iterative reconstruction technique. COMPARISON:  10/12/2014 FINDINGS: Brain: Diffuse cerebral atrophy. Ventricular dilatation consistent with central atrophy. Low-attenuation changes in the deep white matter consistent with small vessel ischemia. No abnormal extra-axial fluid collections. No mass effect or midline shift. Gray-white matter junctions are distinct. Basal cisterns are not effaced. No acute intracranial hemorrhage. Vascular: No hyperdense vessel or unexpected calcification. Skull: Calvarium appears intact. Sinuses/Orbits: Paranasal sinuses and mastoid air cells are clear. Other: None. IMPRESSION: No acute intracranial abnormalities. Chronic atrophy and small vessel ischemic changes. Electronically Signed   By: Lucienne Capers M.D.   On: 10/19/2022 23:07     Time coordinating discharge: Over 30 minutes    Dwyane Dee, MD  [facility] 10/22/2022, 11:46 AM

## 2022-10-23 ENCOUNTER — Telehealth: Payer: Self-pay

## 2022-10-23 NOTE — Telephone Encounter (Signed)
Pt has been discharged from practice- opened in error

## 2022-10-24 LAB — CYTOLOGY - NON PAP
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
## 2022-10-30 ENCOUNTER — Ambulatory Visit (INDEPENDENT_AMBULATORY_CARE_PROVIDER_SITE_OTHER): Admitting: Family

## 2022-10-30 ENCOUNTER — Encounter: Payer: Self-pay | Admitting: Family

## 2022-10-30 VITALS — BP 190/100 | HR 91 | Temp 97.8°F | Ht 71.0 in | Wt 158.0 lb

## 2022-10-30 DIAGNOSIS — L309 Dermatitis, unspecified: Secondary | ICD-10-CM | POA: Diagnosis not present

## 2022-10-30 DIAGNOSIS — Z7689 Persons encountering health services in other specified circumstances: Secondary | ICD-10-CM | POA: Diagnosis not present

## 2022-10-30 DIAGNOSIS — D539 Nutritional anemia, unspecified: Secondary | ICD-10-CM

## 2022-10-30 DIAGNOSIS — I129 Hypertensive chronic kidney disease with stage 1 through stage 4 chronic kidney disease, or unspecified chronic kidney disease: Secondary | ICD-10-CM

## 2022-10-30 DIAGNOSIS — Z1211 Encounter for screening for malignant neoplasm of colon: Secondary | ICD-10-CM

## 2022-10-30 DIAGNOSIS — L299 Pruritus, unspecified: Secondary | ICD-10-CM

## 2022-10-30 DIAGNOSIS — N1832 Chronic kidney disease, stage 3b: Secondary | ICD-10-CM

## 2022-10-30 DIAGNOSIS — N183 Chronic kidney disease, stage 3 unspecified: Secondary | ICD-10-CM

## 2022-10-30 DIAGNOSIS — F331 Major depressive disorder, recurrent, moderate: Secondary | ICD-10-CM

## 2022-10-30 DIAGNOSIS — Z1231 Encounter for screening mammogram for malignant neoplasm of breast: Secondary | ICD-10-CM

## 2022-10-30 DIAGNOSIS — E7800 Pure hypercholesterolemia, unspecified: Secondary | ICD-10-CM

## 2022-10-30 DIAGNOSIS — E538 Deficiency of other specified B group vitamins: Secondary | ICD-10-CM

## 2022-10-30 MED ORDER — HYDROXYZINE HCL 25 MG PO TABS: 25.0000 mg | ORAL_TABLET | Freq: Four times a day (QID) | ORAL | 1 refills | Status: DC

## 2022-10-30 MED ORDER — ALBUTEROL SULFATE HFA 108 (90 BASE) MCG/ACT IN AERS: 2.0000 | INHALATION_SPRAY | Freq: Every day | RESPIRATORY_TRACT | 5 refills | Status: DC | PRN

## 2022-10-31 LAB — COMPLETE METABOLIC PANEL WITH GFR
AG Ratio: 1.5 (calc) (ref 1.0–2.5)
ALT: 14 U/L (ref 6–29)
AST: 33 U/L (ref 10–35)
Albumin: 3.5 g/dL — ABNORMAL LOW (ref 3.6–5.1)
Alkaline phosphatase (APISO): 101 U/L (ref 37–153)
BUN/Creatinine Ratio: 9 (calc) (ref 6–22)
BUN: 14 mg/dL (ref 7–25)
CO2: 22 mmol/L (ref 20–32)
Calcium: 8.4 mg/dL — ABNORMAL LOW (ref 8.6–10.4)
Chloride: 109 mmol/L (ref 98–110)
Creat: 1.56 mg/dL — ABNORMAL HIGH (ref 0.50–1.05)
Globulin: 2.4 g/dL (calc) (ref 1.9–3.7)
Glucose, Bld: 76 mg/dL (ref 65–99)
Potassium: 4 mmol/L (ref 3.5–5.3)
Sodium: 141 mmol/L (ref 135–146)
Total Bilirubin: 0.3 mg/dL (ref 0.2–1.2)
Total Protein: 5.9 g/dL — ABNORMAL LOW (ref 6.1–8.1)
eGFR: 38 mL/min/{1.73_m2} — ABNORMAL LOW (ref >=60)

## 2022-10-31 LAB — LIPID PANEL
Cholesterol: 159 mg/dL (ref <200)
HDL: 70 mg/dL (ref >=50)
LDL Cholesterol (Calc): 69 mg/dL (calc)
Non-HDL Cholesterol (Calc): 89 mg/dL (calc) (ref <130)
Total CHOL/HDL Ratio: 2.3 (calc) (ref <5.0)
Triglycerides: 110 mg/dL (ref <150)

## 2022-10-31 LAB — CBC WITH DIFFERENTIAL/PLATELET
Absolute Monocytes: 361 cells/uL (ref 200–950)
Basophils Absolute: 42 cells/uL (ref 0–200)
Basophils Relative: 0.5 %
Eosinophils Absolute: 101 cells/uL (ref 15–500)
Eosinophils Relative: 1.2 %
HCT: 23.7 % — ABNORMAL LOW (ref 35.0–45.0)
Hemoglobin: 8.1 g/dL — ABNORMAL LOW (ref 11.7–15.5)
Lymphs Abs: 1168 cells/uL (ref 850–3900)
MCH: 34.8 pg — ABNORMAL HIGH (ref 27.0–33.0)
MCHC: 34.2 g/dL (ref 32.0–36.0)
MCV: 101.7 fL — ABNORMAL HIGH (ref 80.0–100.0)
MPV: 10.2 fL (ref 7.5–12.5)
Monocytes Relative: 4.3 %
Neutro Abs: 6728 cells/uL (ref 1500–7800)
Neutrophils Relative %: 80.1 %
Platelets: 274 10*3/uL (ref 140–400)
RBC: 2.33 10*6/uL — ABNORMAL LOW (ref 3.80–5.10)
RDW: 12.4 % (ref 11.0–15.0)
Total Lymphocyte: 13.9 %
WBC: 8.4 10*3/uL (ref 3.8–10.8)

## 2022-10-31 LAB — VITAMIN B12: Vitamin B-12: 321 pg/mL (ref 200–1100)

## 2022-10-31 LAB — TSH: TSH: 3.12 mIU/L (ref 0.40–4.50)

## 2022-11-01 ENCOUNTER — Telehealth: Payer: Self-pay

## 2022-11-01 NOTE — Telephone Encounter (Signed)
Patient called stating she seen [place], Nelda Bucks, NP on Monday and Dinah mentioned that she needs to get [place] her colonoscopy results however, Dinah  did not have her sign anything or explain exactly what steps need to be taken in this process.   Patient then went on to explain that she signed a release of information in her new patient packet and asked if that would take care of [place] getting colonoscopy results.  I asked patient what group performed her colonoscopy and she stated [facility]. I then explained that [facility]ler is part of the [facility] and we already have access to her colonoscopy results. Patient states this is great and did not need anything further at this time.  See chart review- procedure tab- colonoscopy dated 02/02/2021

## 2022-11-03 LAB — CBC AND DIFFERENTIAL: Hemoglobin: 8.7 — AB (ref 12.0–16.0)

## 2022-11-03 LAB — PROTEIN / CREATININE RATIO, URINE
Albumin, U: 104.8
Creatinine, Urine: 46.9

## 2022-11-03 LAB — BASIC METABOLIC PANEL
BUN: 11 (ref 4–21)
CO2: 20 (ref 13–22)
Chloride: 107 (ref 99–108)
Creatinine: 1.4 — AB (ref 0.5–1.1)
Glucose: 89
Potassium: 3.8 mEq/L (ref 3.5–5.1)
Sodium: 140 (ref 137–147)

## 2022-11-03 LAB — IRON,TIBC AND FERRITIN PANEL
%SAT: 42
Ferritin: 548
Iron: 101
TIBC: 238
UIBC: 137

## 2022-11-03 LAB — COMPREHENSIVE METABOLIC PANEL
Albumin: 3.9 (ref 3.5–5.0)
Calcium: 8.5 — AB (ref 8.7–10.7)
eGFR: 42

## 2022-11-06 ENCOUNTER — Other Ambulatory Visit: Payer: Self-pay | Admitting: Family Medicine

## 2022-11-13 ENCOUNTER — Ambulatory Visit: Admitting: Family

## 2022-11-14 ENCOUNTER — Encounter: Payer: Self-pay | Admitting: Family

## 2022-11-15 ENCOUNTER — Encounter: Admitting: Family

## 2022-11-15 ENCOUNTER — Encounter: Payer: Self-pay | Admitting: Family

## 2022-11-15 NOTE — Progress Notes (Signed)
This encounter was created in error - please disregard. Patient left after vital signs were checked told CMA" just here to check Blood pressure not to see the doctor"

## 2022-11-15 NOTE — Progress Notes (Signed)
Patient walked out after I did vitals. She stated she was here for BP check not a provider visit. She reported home BP of 178/118 yesterday and 160/105 today on home BP machine. Provider has been made aware.

## 2023-01-02 ENCOUNTER — Other Ambulatory Visit: Payer: Self-pay | Admitting: Acute Care

## 2023-01-02 DIAGNOSIS — F17210 Nicotine dependence, cigarettes, uncomplicated: Secondary | ICD-10-CM

## 2023-01-02 DIAGNOSIS — Z87891 Personal history of nicotine dependence: Secondary | ICD-10-CM

## 2023-01-02 DIAGNOSIS — Z122 Encounter for screening for malignant neoplasm of respiratory organs: Secondary | ICD-10-CM

## 2023-02-02 ENCOUNTER — Other Ambulatory Visit

## 2023-02-06 LAB — LAB REPORT - SCANNED
Albumin, Urine POC: 238
Albumin/Creatinine Ratio, Urine, POC: 173
Creatinine, POC: 137.6 mg/dL
EGFR: 33

## 2023-02-08 ENCOUNTER — Telehealth: Admitting: Family

## 2023-02-09 ENCOUNTER — Encounter: Payer: Self-pay | Admitting: Family

## 2023-02-09 ENCOUNTER — Other Ambulatory Visit: Payer: Self-pay | Admitting: Family Medicine

## 2023-02-09 ENCOUNTER — Ambulatory Visit (INDEPENDENT_AMBULATORY_CARE_PROVIDER_SITE_OTHER): Admitting: Family

## 2023-02-09 ENCOUNTER — Other Ambulatory Visit: Payer: Self-pay

## 2023-02-09 VITALS — BP 124/82 | HR 79 | Temp 98.6°F | Resp 20 | Ht 71.0 in | Wt 148.6 lb

## 2023-02-09 DIAGNOSIS — S20162A Insect bite (nonvenomous) of breast, left breast, initial encounter: Secondary | ICD-10-CM | POA: Diagnosis not present

## 2023-02-09 DIAGNOSIS — N61 Mastitis without abscess: Secondary | ICD-10-CM

## 2023-02-09 DIAGNOSIS — W57XXXA Bitten or stung by nonvenomous insect and other nonvenomous arthropods, initial encounter: Secondary | ICD-10-CM

## 2023-02-09 MED ORDER — SERTRALINE HCL 50 MG PO TABS: 50.0000 mg | ORAL_TABLET | Freq: Every day | ORAL | 3 refills | Status: DC

## 2023-02-09 MED ORDER — DOXYCYCLINE HYCLATE 100 MG PO TABS: 100.0000 mg | ORAL_TABLET | Freq: Two times a day (BID) | ORAL | 0 refills | Status: AC
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
## 2023-02-09 NOTE — Patient Instructions (Signed)
Tick Bite Information, Adult  Ticks are insects that draw blood for food. They climb onto people and animals that brush against the leaves and grasses that they live in. They then bite and attach to the skin. Most ticks are harmless, but some ticks may carry germs that can cause disease. These germs are spread to a person through a bite. To lower your risk of getting a disease from a tick bite, make sure you: Take steps to prevent tick bites. Check for ticks after being outdoors where ticks live. Watch for symptoms of disease if a tick attached to you or if you think a tick bit you. How can I prevent tick bites? Take these steps to help prevent tick bites when you go outdoors in an area where ticks live: Before you go outdoors: Wear long sleeves and long pants to protect your skin from ticks. Wear light-colored clothing so you can see ticks easier. Tuck your pant legs into your socks. Apply insect repellent that has DEET (20% or higher), picaridin, or IR3535 in it to the following areas: Any bare skin. Avoid areas around the eyes and mouth. Edges of clothing, like the top of your boots, the bottom of your pant legs, and your sleeve cuffs. Consider applying an insect repellant that contains permethrin. Follow the instructions on the label. Do not apply permethrin directly to the skin. Instead, apply to the following areas: Clothing and shoes. Outdoor gear and tents. When you are outdoors: Avoid walking through areas with long grass. If you are walking on a trail, stay in the middle of the trail so your skin, hair, and clothing do not touch the bushes. Check for ticks on your clothing, hair, and skin often while you are outdoors. Check again before you go inside. When you go indoors: Check your clothing for ticks. Tumble dry clothes in a dryer on high heat for at least 10 minutes. If clothes are damp, additional time may be needed. If clothes require washing, use hot water. Check your gear and  pets. Shower soon after being outdoors. Check your body for ticks. Do a full body check using a mirror. Be sure to check your scalp, neck, armpits, waist, groin, and joint areas. These are the spots where ticks attach themselves most often. What is the best way to remove a tick?  Remove the tick as soon as possible. Removing it can prevent germs from passing to your body. Do not remove the tick with your bare fingers. Do not try to remove a tick with heat, alcohol, petroleum jelly, or fingernail polish. These things can cause the tick to salivate and regurgitate into your bloodstream, increasing your risk of getting a disease. To remove a tick that is crawling on your skin: Go outside and brush the tick off. Use tape or a lint roller. To remove a tick that is attached to your skin: Wash your hands. If you have gloves, put them on. Use a fine-tipped tweezer, curved forceps, or a tick-removal tool to gently grasp the tick as close to your skin and the tick's head as possible. Gently pull with a steady, upward, and even pressure until the tick lets go. While removing the tick: Take care to keep the tick's head attached to its body. Do not twist or jerk the tick. This can make the tick's head or mouth parts break off and stay in your skin. If this happens, try to remove the mouth parts with tweezers. If you cannot remove them, leave   the area alone and let the skin heal. Do not squeeze or crush the tick's body. This could force disease-carrying fluids from the tick into your body. What should I do after removing a tick? Clean the bite area and your hands with soap and water, rubbing alcohol, or an iodine scrub. If an antiseptic cream or ointment is available, put a small amount on the bite area. Wash and disinfect any tools that you used to remove the tick. How should I dispose of a tick? To dispose of a live tick, use one of these methods: Place it in rubbing alcohol. Place it in a sealed bag  or container, and throw it away. Wrap it tightly in tape, and throw it away. Flush it down the toilet. Where to find more information Centers for Disease Control and Prevention: cdc.gov/ticks U.S. Environmental Protection Agency: epa.gov/insect-repellents Contact a health care provider if: You have symptoms of a disease after a tick bite. Symptoms of a tick-borne disease can occur from moments after the tick bites to 30 days after a tick is removed. Symptoms include: Fever or chills. A red rash that makes a circle (bull's-eye rash) in the bite area. Redness and swelling in the bite area. Headache or stiff neck. Muscle, joint, or bone pain. Abnormal tiredness. Numbness in your legs or trouble walking or moving your legs. Tender or swollen lymph glands. Abdominal pain, vomiting, diarrhea, or weight loss. Get help right away if: You are not able to remove a tick. You have muscle weakness or paralysis. Your symptoms get worse or you experience new symptoms. You find an engorged tick on your skin and you are in an area where there is a higher risk of disease from ticks. Summary Ticks may carry germs that can spread to a person through a bite. These germs can cause disease. Wear protective clothing and use insect repellent to prevent tick bites. Follow the instructions on the label. If you find a tick on your body, remove it as soon as possible. If the tick is attached, do not try to remove it with heat, alcohol, petroleum jelly, or fingernail polish. If you have symptoms of a disease after being bitten by a tick, contact a health care provider. This information is not intended to replace advice given to you by your health care provider. Make sure you discuss any questions you have with your health care provider. Document Revised: 11/21/2021 Document Reviewed: 11/21/2021 Elsevier Patient Education  2024 Elsevier Inc.  

## 2023-02-20 ENCOUNTER — Inpatient Hospital Stay: Admission: RE | Admit: 2023-02-20 | Source: Ambulatory Visit

## 2023-02-21 ENCOUNTER — Inpatient Hospital Stay: Admission: RE | Admit: 2023-02-21 | Source: Ambulatory Visit

## 2023-03-27 ENCOUNTER — Other Ambulatory Visit (HOSPITAL_COMMUNITY)
Admission: RE | Admit: 2023-03-27 | Discharge: 2023-03-27 | Disposition: A | Discharge status: Home / Self Care | Source: Ambulatory Visit | Attending: Family Medicine | Admitting: Family Medicine

## 2023-03-27 ENCOUNTER — Other Ambulatory Visit: Payer: Self-pay | Admitting: Family Medicine

## 2023-03-27 DIAGNOSIS — Z124 Encounter for screening for malignant neoplasm of cervix: Secondary | ICD-10-CM | POA: Diagnosis present

## 2023-03-27 DIAGNOSIS — Z113 Encounter for screening for infections with a predominantly sexual mode of transmission: Secondary | ICD-10-CM | POA: Insufficient documentation

## 2023-04-09 ENCOUNTER — Ambulatory Visit
Admission: RE | Admit: 2023-04-09 | Discharge: 2023-04-09 | Disposition: A | Discharge status: Home / Self Care | Source: Ambulatory Visit | Attending: Acute Care | Admitting: Acute Care

## 2023-04-09 DIAGNOSIS — Z87891 Personal history of nicotine dependence: Secondary | ICD-10-CM

## 2023-04-09 DIAGNOSIS — Z122 Encounter for screening for malignant neoplasm of respiratory organs: Secondary | ICD-10-CM

## 2023-04-09 DIAGNOSIS — F17210 Nicotine dependence, cigarettes, uncomplicated: Secondary | ICD-10-CM

## 2023-04-09 LAB — CYTOLOGY - PAP
Chlamydia: NEGATIVE
Comment: NEGATIVE
Comment: NEGATIVE
Comment: NEGATIVE
Comment: NEGATIVE
Comment: NORMAL
Diagnosis: NEGATIVE
HSV1: NEGATIVE
HSV2: NEGATIVE
High risk HPV: NEGATIVE
Neisseria Gonorrhea: NEGATIVE
Trichomonas: NEGATIVE

## 2023-04-13 ENCOUNTER — Other Ambulatory Visit: Payer: Self-pay | Admitting: Acute Care

## 2023-04-13 DIAGNOSIS — Z122 Encounter for screening for malignant neoplasm of respiratory organs: Secondary | ICD-10-CM

## 2023-04-13 DIAGNOSIS — Z87891 Personal history of nicotine dependence: Secondary | ICD-10-CM

## 2023-04-13 DIAGNOSIS — F17210 Nicotine dependence, cigarettes, uncomplicated: Secondary | ICD-10-CM

## 2023-04-30 ENCOUNTER — Ambulatory Visit: Admitting: Family

## 2023-08-17 ENCOUNTER — Encounter: Payer: Self-pay | Admitting: Internal Medicine
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
## 2023-10-08 ENCOUNTER — Inpatient Hospital Stay: Admitting: Nurse Practitioner

## 2023-10-08 ENCOUNTER — Inpatient Hospital Stay

## 2023-10-25 ENCOUNTER — Encounter: Admitting: Physician Assistant

## 2023-10-25 ENCOUNTER — Other Ambulatory Visit

## 2023-10-31 ENCOUNTER — Inpatient Hospital Stay (HOSPITAL_COMMUNITY)

## 2023-10-31 ENCOUNTER — Emergency Department (HOSPITAL_COMMUNITY)

## 2023-10-31 ENCOUNTER — Encounter (HOSPITAL_COMMUNITY): Payer: Self-pay

## 2023-10-31 ENCOUNTER — Other Ambulatory Visit: Payer: Self-pay

## 2023-10-31 ENCOUNTER — Inpatient Hospital Stay (HOSPITAL_COMMUNITY)
Admission: EM | Admit: 2023-10-31 | Discharge: 2023-11-02 | DRG: 313 | Disposition: A | Discharge status: Home / Self Care | Source: Ambulatory Visit | Attending: Internal Medicine | Admitting: Internal Medicine

## 2023-10-31 DIAGNOSIS — Z881 Allergy status to other antibiotic agents status: Secondary | ICD-10-CM

## 2023-10-31 DIAGNOSIS — G8929 Other chronic pain: Secondary | ICD-10-CM | POA: Diagnosis present

## 2023-10-31 DIAGNOSIS — N1831 Chronic kidney disease, stage 3a: Secondary | ICD-10-CM | POA: Diagnosis present

## 2023-10-31 DIAGNOSIS — J4520 Mild intermittent asthma, uncomplicated: Secondary | ICD-10-CM | POA: Diagnosis not present

## 2023-10-31 DIAGNOSIS — R0789 Other chest pain: Secondary | ICD-10-CM | POA: Diagnosis present

## 2023-10-31 DIAGNOSIS — D539 Nutritional anemia, unspecified: Secondary | ICD-10-CM | POA: Diagnosis present

## 2023-10-31 DIAGNOSIS — F1090 Alcohol use, unspecified, uncomplicated: Secondary | ICD-10-CM | POA: Insufficient documentation

## 2023-10-31 DIAGNOSIS — N179 Acute kidney failure, unspecified: Principal | ICD-10-CM

## 2023-10-31 DIAGNOSIS — F32A Depression, unspecified: Secondary | ICD-10-CM | POA: Diagnosis present

## 2023-10-31 DIAGNOSIS — D72829 Elevated white blood cell count, unspecified: Secondary | ICD-10-CM | POA: Diagnosis present

## 2023-10-31 DIAGNOSIS — M5011 Cervical disc disorder with radiculopathy,  high cervical region: Secondary | ICD-10-CM | POA: Diagnosis present

## 2023-10-31 DIAGNOSIS — F1010 Alcohol abuse, uncomplicated: Secondary | ICD-10-CM | POA: Diagnosis present

## 2023-10-31 DIAGNOSIS — Z8249 Family history of ischemic heart disease and other diseases of the circulatory system: Secondary | ICD-10-CM | POA: Diagnosis not present

## 2023-10-31 DIAGNOSIS — D638 Anemia in other chronic diseases classified elsewhere: Secondary | ICD-10-CM

## 2023-10-31 DIAGNOSIS — I129 Hypertensive chronic kidney disease with stage 1 through stage 4 chronic kidney disease, or unspecified chronic kidney disease: Secondary | ICD-10-CM | POA: Diagnosis present

## 2023-10-31 DIAGNOSIS — E559 Vitamin D deficiency, unspecified: Secondary | ICD-10-CM | POA: Diagnosis not present

## 2023-10-31 DIAGNOSIS — D649 Anemia, unspecified: Secondary | ICD-10-CM | POA: Diagnosis not present

## 2023-10-31 DIAGNOSIS — Z885 Allergy status to narcotic agent status: Secondary | ICD-10-CM | POA: Diagnosis not present

## 2023-10-31 DIAGNOSIS — E871 Hypo-osmolality and hyponatremia: Secondary | ICD-10-CM | POA: Diagnosis present

## 2023-10-31 DIAGNOSIS — I700 Atherosclerosis of aorta: Secondary | ICD-10-CM | POA: Diagnosis present

## 2023-10-31 DIAGNOSIS — D631 Anemia in chronic kidney disease: Secondary | ICD-10-CM | POA: Diagnosis present

## 2023-10-31 DIAGNOSIS — E860 Dehydration: Secondary | ICD-10-CM | POA: Diagnosis present

## 2023-10-31 DIAGNOSIS — M5412 Radiculopathy, cervical region: Secondary | ICD-10-CM | POA: Diagnosis not present

## 2023-10-31 DIAGNOSIS — Z825 Family history of asthma and other chronic lower respiratory diseases: Secondary | ICD-10-CM | POA: Diagnosis not present

## 2023-10-31 DIAGNOSIS — I2510 Atherosclerotic heart disease of native coronary artery without angina pectoris: Secondary | ICD-10-CM | POA: Diagnosis present

## 2023-10-31 DIAGNOSIS — N1832 Chronic kidney disease, stage 3b: Secondary | ICD-10-CM | POA: Diagnosis present

## 2023-10-31 DIAGNOSIS — F17210 Nicotine dependence, cigarettes, uncomplicated: Secondary | ICD-10-CM | POA: Insufficient documentation

## 2023-10-31 DIAGNOSIS — I10 Essential (primary) hypertension: Secondary | ICD-10-CM | POA: Diagnosis not present

## 2023-10-31 DIAGNOSIS — Z79899 Other long term (current) drug therapy: Secondary | ICD-10-CM

## 2023-10-31 DIAGNOSIS — J45909 Unspecified asthma, uncomplicated: Secondary | ICD-10-CM | POA: Insufficient documentation

## 2023-10-31 DIAGNOSIS — M5030 Other cervical disc degeneration, unspecified cervical region: Secondary | ICD-10-CM | POA: Diagnosis not present

## 2023-10-31 DIAGNOSIS — E876 Hypokalemia: Secondary | ICD-10-CM | POA: Diagnosis present

## 2023-10-31 DIAGNOSIS — R079 Chest pain, unspecified: Secondary | ICD-10-CM

## 2023-10-31 DIAGNOSIS — Z83438 Family history of other disorder of lipoprotein metabolism and other lipidemia: Secondary | ICD-10-CM

## 2023-10-31 DIAGNOSIS — Z833 Family history of diabetes mellitus: Secondary | ICD-10-CM

## 2023-10-31 DIAGNOSIS — R7989 Other specified abnormal findings of blood chemistry: Secondary | ICD-10-CM | POA: Diagnosis not present

## 2023-10-31 DIAGNOSIS — Z5982 Transportation insecurity: Secondary | ICD-10-CM

## 2023-10-31 DIAGNOSIS — J029 Acute pharyngitis, unspecified: Secondary | ICD-10-CM | POA: Insufficient documentation

## 2023-10-31 DIAGNOSIS — Z531 Procedure and treatment not carried out because of patient's decision for reasons of belief and group pressure: Secondary | ICD-10-CM | POA: Diagnosis present

## 2023-10-31 LAB — MAGNESIUM: Magnesium: <0.5 mg/dL — CL (ref 1.7–2.4)

## 2023-10-31 LAB — URINALYSIS, ROUTINE W REFLEX MICROSCOPIC
Bilirubin Urine: NEGATIVE
Glucose, UA: NEGATIVE mg/dL
Hgb urine dipstick: NEGATIVE
Ketones, ur: NEGATIVE mg/dL
Nitrite: NEGATIVE
Protein, ur: NEGATIVE mg/dL
Specific Gravity, Urine: 1.021 (ref 1.005–1.030)
WBC, UA: >50 WBC/hpf (ref 0–5)
pH: 5 (ref 5.0–8.0)

## 2023-10-31 LAB — BASIC METABOLIC PANEL
Anion gap: 15 (ref 5–15)
BUN: 14 mg/dL (ref 8–23)
CO2: 19 mmol/L — ABNORMAL LOW (ref 22–32)
Calcium: 6.1 mg/dL — CL (ref 8.9–10.3)
Chloride: 99 mmol/L (ref 98–111)
Creatinine, Ser: 1.8 mg/dL — ABNORMAL HIGH (ref 0.44–1.00)
GFR, Estimated: 31 mL/min — ABNORMAL LOW (ref >60)
Glucose, Bld: 105 mg/dL — ABNORMAL HIGH (ref 70–99)
Potassium: 3.3 mmol/L — ABNORMAL LOW (ref 3.5–5.1)
Sodium: 133 mmol/L — ABNORMAL LOW (ref 135–145)

## 2023-10-31 LAB — TROPONIN I (HIGH SENSITIVITY)
Troponin I (High Sensitivity): 19 ng/L — ABNORMAL HIGH (ref <18)
Troponin I (High Sensitivity): 18 ng/L — ABNORMAL HIGH (ref <18)

## 2023-10-31 LAB — CBC
HCT: 21.6 % — ABNORMAL LOW (ref 36.0–46.0)
Hemoglobin: 7.2 g/dL — ABNORMAL LOW (ref 12.0–15.0)
MCH: 32.4 pg (ref 26.0–34.0)
MCHC: 33.3 g/dL (ref 30.0–36.0)
MCV: 97.3 fL (ref 80.0–100.0)
Platelets: 230 10*3/uL (ref 150–400)
RBC: 2.22 MIL/uL — ABNORMAL LOW (ref 3.87–5.11)
RDW: 14.9 % (ref 11.5–15.5)
WBC: 12.5 10*3/uL — ABNORMAL HIGH (ref 4.0–10.5)
nRBC: 0 % (ref 0.0–0.2)

## 2023-10-31 LAB — SODIUM, URINE, RANDOM: Sodium, Ur: 53 mmol/L

## 2023-10-31 LAB — CREATININE, URINE, RANDOM: Creatinine, Urine: 56 mg/dL

## 2023-10-31 LAB — CK: Total CK: 54 U/L (ref 38–234)

## 2023-10-31 LAB — D-DIMER, QUANTITATIVE: D-Dimer, Quant: 2.19 ug{FEU}/mL — ABNORMAL HIGH (ref 0.00–0.50)

## 2023-10-31 MED ORDER — LACTATED RINGERS IV BOLUS
1000.0000 mL | Freq: Once | INTRAVENOUS | Status: AC
  Administered 2023-10-31: 1000 mL via INTRAVENOUS

## 2023-10-31 MED ORDER — NICOTINE 7 MG/24HR TD PT24
7.0000 mg | MEDICATED_PATCH | Freq: Every day | TRANSDERMAL | Status: DC
  Administered 2023-11-01 – 2023-11-02 (×3): 7 mg via TRANSDERMAL
  Filled 2023-10-31 (×4): qty 1

## 2023-10-31 MED ORDER — CALCIUM GLUCONATE-NACL 1-0.675 GM/50ML-% IV SOLN
1.0000 g | Freq: Once | INTRAVENOUS | Status: AC
  Administered 2023-10-31: 1000 mg via INTRAVENOUS
  Filled 2023-10-31: qty 50

## 2023-10-31 MED ORDER — CALCIUM GLUCONATE-NACL 2-0.675 GM/100ML-% IV SOLN
2.0000 g | Freq: Once | INTRAVENOUS | Status: AC
Start: 2023-10-31 — End: 2023-11-01
  Administered 2023-11-01: 2000 mg via INTRAVENOUS
  Filled 2023-10-31 (×2): qty 100

## 2023-10-31 MED ORDER — SODIUM CHLORIDE 0.9% FLUSH: 3.0000 mL | INTRAVENOUS | Status: DC | PRN

## 2023-10-31 MED ORDER — MENTHOL 3 MG MT LOZG: 1.0000 | LOZENGE | OROMUCOSAL | Status: DC | PRN

## 2023-10-31 MED ORDER — ONDANSETRON HCL 4 MG/2ML IJ SOLN: 4.0000 mg | Freq: Four times a day (QID) | INTRAMUSCULAR | Status: DC | PRN

## 2023-10-31 MED ORDER — SODIUM CHLORIDE 0.9% FLUSH
3.0000 mL | Freq: Two times a day (BID) | INTRAVENOUS | Status: DC
  Administered 2023-10-31 – 2023-11-02 (×4): 3 mL via INTRAVENOUS

## 2023-10-31 MED ORDER — SODIUM CHLORIDE 0.9% FLUSH
3.0000 mL | Freq: Two times a day (BID) | INTRAVENOUS | Status: DC
  Administered 2023-11-01 – 2023-11-02 (×3): 3 mL via INTRAVENOUS

## 2023-10-31 MED ORDER — POTASSIUM CHLORIDE CRYS ER 20 MEQ PO TBCR
20.0000 meq | EXTENDED_RELEASE_TABLET | Freq: Once | ORAL | Status: AC
  Administered 2023-10-31: 20 meq via ORAL
  Filled 2023-10-31: qty 1

## 2023-10-31 MED ORDER — THIAMINE HCL 100 MG/ML IJ SOLN
100.0000 mg | Freq: Once | INTRAMUSCULAR | Status: AC
  Administered 2023-10-31: 100 mg via INTRAVENOUS
  Filled 2023-10-31: qty 2

## 2023-10-31 MED ORDER — ACETAMINOPHEN 325 MG PO TABS
650.0000 mg | ORAL_TABLET | Freq: Four times a day (QID) | ORAL | Status: DC | PRN
  Administered 2023-10-31: 650 mg via ORAL
  Filled 2023-10-31: qty 2

## 2023-10-31 MED ORDER — HEPARIN SODIUM (PORCINE) 5000 UNIT/ML IJ SOLN
5000.0000 [IU] | Freq: Three times a day (TID) | INTRAMUSCULAR | Status: DC
  Administered 2023-10-31: 5000 [IU] via SUBCUTANEOUS
  Filled 2023-10-31: qty 1

## 2023-10-31 MED ORDER — SODIUM CHLORIDE 0.9 % IV SOLN: INTRAVENOUS | Status: AC

## 2023-10-31 MED ORDER — IOHEXOL 350 MG/ML SOLN
55.0000 mL | Freq: Once | INTRAVENOUS | Status: AC | PRN
  Administered 2023-10-31: 55 mL via INTRAVENOUS

## 2023-10-31 MED ORDER — IPRATROPIUM-ALBUTEROL 0.5-2.5 (3) MG/3ML IN SOLN: 3.0000 mL | RESPIRATORY_TRACT | Status: DC | PRN

## 2023-10-31 MED ORDER — SODIUM CHLORIDE 0.9 % IV SOLN: 250.0000 mL | INTRAVENOUS | Status: AC | PRN

## 2023-10-31 MED ORDER — ONDANSETRON HCL 4 MG PO TABS: 4.0000 mg | ORAL_TABLET | Freq: Four times a day (QID) | ORAL | Status: DC | PRN

## 2023-10-31 MED ORDER — THIAMINE MONONITRATE 100 MG PO TABS
100.0000 mg | ORAL_TABLET | Freq: Every day | ORAL | Status: DC
  Administered 2023-11-01 – 2023-11-02 (×2): 100 mg via ORAL
  Filled 2023-10-31 (×2): qty 1

## 2023-10-31 MED ORDER — FOLIC ACID 1 MG PO TABS
1.0000 mg | ORAL_TABLET | Freq: Every day | ORAL | Status: DC
  Administered 2023-11-01 – 2023-11-02 (×2): 1 mg via ORAL
  Filled 2023-10-31 (×2): qty 1

## 2023-10-31 MED ORDER — MAGNESIUM SULFATE 2 GM/50ML IV SOLN
2.0000 g | Freq: Once | INTRAVENOUS | Status: AC
  Administered 2023-10-31: 2 g via INTRAVENOUS
  Filled 2023-10-31: qty 50

## 2023-10-31 NOTE — ED Provider Triage Note (Signed)
 Emergency Medicine Provider Triage Evaluation Note  Kelsey Poole , a 63 y.o. female  was evaluated in triage.  Pt complains of a few days of neck pain, shoulder pain, and then today developed radiation of that pain down the left arm and back.  Patient was at her doctor's office and they sent her here.  She received aspirin and nitroglycerin from EMS..  Review of Systems  Positive:  Negative:   Physical Exam  BP (!) 154/98 (BP Location: Right Arm)   Pulse 99   Temp 99.3 F (37.4 C) (Oral)   Resp 18   SpO2 97%  Gen:   Awake, no distress   Resp:  Normal effort  MSK:   Moves extremities without difficulty  Other:  Patient with pain to palpation of the trapezius muscle, biceps muscle.  Good pulses in bilateral upper extremities.  Normal heart sounds, clear lungs.  Medical Decision Making  Medically screening exam initiated at 2:45 PM.  Appropriate orders placed.  Kelsey Poole was informed that the remainder of the evaluation will be completed by another provider, this initial triage assessment does not replace that evaluation, and the importance of remaining in the ED until their evaluation is complete.  Nonischemic EKG, Trope and chest x-ray ordered.  Lower suspicion for thrombotic event, however did order D-dimer to use to rule out possibility of upper extremity DVT in the left.  Considered dissection workup in this patient.   Anders Simmonds T, DO 10/31/23 1447

## 2023-10-31 NOTE — ED Triage Notes (Signed)
 Pt arrives via EMS from her Doctor's office. Pt c/o fullness in left ear and neck soreness for the past 4 days. Pt reports last night she began experiencing a shooting pain down her spine that radiated to left side of her chest and left arm. Pt arrives AxOx4. EMS administred 324mg  of asa and 1000mg  of tylenol. Denies chest pain at this time.
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
## 2023-10-31 NOTE — ED Provider Notes (Signed)
 [place] EMERGENCY DEPARTMENT AT [facility] Provider Note   CSN: [id number] Arrival date & time: 10/31/23  1419     History  Chief Complaint  Patient presents with   Chest Pain   Ear Fullness    Kelsey Poole is a 63 y.o. female.  This is a 63 year old female presenting emergency department for neck pain x 4 days with development of chest pain radiating towards left arm and back.  Reports symptoms are seemingly improved, but is still having some neck pain.  Denies headache, vision changes, unilateral weakness.  No nausea no vomiting.   Chest Pain Ear Fullness Associated symptoms include chest pain.       Home Medications Prior to Admission medications   Medication Sig Start Date End Date Taking? Authorizing Provider  Melatonin 12 MG TABS Take 12 mg by mouth at bedtime.   Yes [provider]      Allergies    Percocet [oxycodone-acetaminophen], Cephalexin, and Z-pak [azithromycin]    Review of Systems   Review of Systems  Cardiovascular:  Positive for chest pain.    Physical Exam Updated Vital Signs BP (!) 134/90   Pulse 84   Temp 98.3 F (36.8 C) (Oral)   Resp 16   SpO2 99%  Physical Exam Vitals and nursing note reviewed.  Constitutional:      General: She is not in acute distress.    Appearance: She is not toxic-appearing.  HENT:     Head: Normocephalic.  Cardiovascular:     Rate and Rhythm: Normal rate and regular rhythm.     Pulses:          Radial pulses are 2+ on the right side and 2+ on the left side.  Pulmonary:     Effort: Pulmonary effort is normal.     Breath sounds: Normal breath sounds.  Musculoskeletal:     Right lower leg: No edema.     Left lower leg: No edema.  Skin:    General: Skin is warm and dry.     Capillary Refill: Capillary refill takes less than 2 seconds.  Neurological:     Mental Status: She is alert and oriented to person, place, and time.  Psychiatric:        Mood and Affect: Mood normal.         Behavior: Behavior normal.     ED Results / Procedures / Treatments   Labs (all labs ordered are listed, but only abnormal results are displayed) Labs Reviewed  BASIC METABOLIC PANEL - Abnormal; Notable for the following components:      Result Value   Sodium 133 (*)    Potassium 3.3 (*)    CO2 19 (*)    Glucose, Bld 105 (*)    Creatinine, Ser 1.80 (*)    Calcium 6.1 (*)    GFR, Estimated 31 (*)    All other components within normal limits  CBC - Abnormal; Notable for the following components:   WBC 12.5 (*)    RBC 2.22 (*)    Hemoglobin 7.2 (*)    HCT 21.6 (*)    All other components within normal limits  D-DIMER, QUANTITATIVE - Abnormal; Notable for the following components:   D-Dimer, Quant 2.19 (*)    All other components within normal limits  MAGNESIUM - Abnormal; Notable for the following components:   Magnesium <0.5 (*)    All other components within normal limits  TROPONIN I (HIGH SENSITIVITY) - Abnormal; Notable  for the following components:   Troponin I (High Sensitivity) 19 (*)    All other components within normal limits  TROPONIN I (HIGH SENSITIVITY) - Abnormal; Notable for the following components:   Troponin I (High Sensitivity) 18 (*)    All other components within normal limits  CK  BASIC METABOLIC PANEL  MAGNESIUM  COMPREHENSIVE METABOLIC PANEL  CBC  OCCULT BLOOD X 1 CARD TO LAB, STOOL  FERRITIN  FOLATE  IRON AND TIBC  VITAMIN B12  RETICULOCYTES  SODIUM, URINE, RANDOM  URINALYSIS, ROUTINE W REFLEX MICROSCOPIC  CREATININE, URINE, RANDOM  OSMOLALITY, URINE  OSMOLALITY  LIPID PANEL  LIPOPROTEIN A (LPA)  TSH  MAGNESIUM  VITAMIN D 25 HYDROXY (VIT D DEFICIENCY, FRACTURES)  PARATHYROID HORMONE, INTACT (NO CA)    EKG None  Radiology US RENAL Result Date: 10/31/2023 CLINICAL DATA:  Acute kidney injury EXAM: RENAL / URINARY TRACT ULTRASOUND COMPLETE COMPARISON:  Renal ultrasound 10/21/2022 FINDINGS: Right Kidney: Renal measurements: 9.9 x  5.1 x 4.4 cm = volume: 117 mL. Echogenicity is increased. No mass or hydronephrosis visualized. Left Kidney: Renal measurements: 10.3 x 4.9 x 4.0 cm = volume: 107 mL. Echogenicity is increased. No mass or hydronephrosis visualized. Bladder: Appears normal for degree of bladder distention. Other: None. IMPRESSION: 1. Increased echogenicity of the kidneys as can be seen in medical renal disease. 2. No hydronephrosis. Electronically Signed   By: Darliss Cheney M.D.   On: 10/31/2023 21:46   CT Angio Chest PE W and/or Wo Contrast Result Date: 10/31/2023 CLINICAL DATA:  Positive D-dimer, left-sided chest pain radiating to back and arm EXAM: CT ANGIOGRAPHY CHEST WITH CONTRAST TECHNIQUE: Multidetector CT imaging of the chest was performed using the standard protocol during bolus administration of intravenous contrast. Multiplanar CT image reconstructions and MIPs were obtained to evaluate the vascular anatomy. RADIATION DOSE REDUCTION: This exam was performed according to the departmental dose-optimization program which includes automated exposure control, adjustment of the mA and/or kV according to patient size and/or use of iterative reconstruction technique. CONTRAST:  55mL OMNIPAQUE IOHEXOL 350 MG/ML SOLN COMPARISON:  04/09/2023 FINDINGS: Cardiovascular: This is a technically adequate evaluation of the pulmonary vasculature. No filling defects or pulmonary emboli. The heart is unremarkable without pericardial effusion. No evidence of thoracic aortic aneurysm or dissection. Atherosclerosis of the aorta and coronary vasculature. Mediastinum/Nodes: No enlarged mediastinal, hilar, or axillary lymph nodes. Thyroid gland, trachea, and esophagus demonstrate no significant findings. Lungs/Pleura: Mild emphysema. No acute airspace disease, effusion, or pneumothorax. Minimal subsegmental atelectasis or scarring within the dependent lower lobes. Central airways are patent. Upper Abdomen: Hepatic steatosis. No acute upper  abdominal findings. Musculoskeletal: No acute or destructive bony abnormalities. Reconstructed images demonstrate no additional findings. Review of the MIP images confirms the above findings. IMPRESSION: 1. No evidence of pulmonary embolus. 2. No acute intrathoracic process. 3. Aortic Atherosclerosis (ICD10-I70.0) and Emphysema (ICD10-J43.9). 4. Coronary artery atherosclerosis. Electronically Signed   By: Sharlet Salina M.D.   On: 10/31/2023 19:56   DG Chest 2 View Result Date: 10/31/2023 CLINICAL DATA:  Chest pain. EXAM: CHEST - 2 VIEW COMPARISON:  October 20, 2022. FINDINGS: The heart size and mediastinal contours are within normal limits. Both lungs are clear. The visualized skeletal structures are unremarkable. IMPRESSION: No active cardiopulmonary disease. Electronically Signed   By: Lupita Raider M.D.   On: 10/31/2023 16:06    Procedures .Critical Care  Performed by: Coral Spikes, DO Authorized by: Coral Spikes, DO   Critical care provider statement:  Critical care time (minutes):  30   Critical care was necessary to treat or prevent imminent or life-threatening deterioration of the following conditions:  Metabolic crisis   Critical care was time spent personally by me on the following activities:  Development of treatment plan with patient or surrogate, discussions with consultants, evaluation of patient's response to treatment, examination of patient, ordering and review of laboratory studies, ordering and review of radiographic studies, ordering and performing treatments and interventions, pulse oximetry, re-evaluation of patient's condition and review of old charts Comments:     Low calcium and low mag which could lead to severe arrhythmia.     Medications Ordered in ED Medications  heparin injection 5,000 Units (5,000 Units Subcutaneous Given 10/31/23 2311)  magnesium sulfate IVPB 2 g 50 mL (2 g Intravenous New Bag/Given 10/31/23 2311)  0.9 %  sodium chloride infusion (  Intravenous New Bag/Given 10/31/23 2319)  sodium chloride flush (NS) 0.9 % injection 3 mL ( Intravenous Canceled Entry 10/31/23 2316)  sodium chloride flush (NS) 0.9 % injection 3 mL (3 mLs Intravenous Given 10/31/23 2316)  sodium chloride flush (NS) 0.9 % injection 3 mL (has no administration in time range)  0.9 %  sodium chloride infusion (has no administration in time range)  ondansetron (ZOFRAN) tablet 4 mg (has no administration in time range)    Or  ondansetron (ZOFRAN) injection 4 mg (has no administration in time range)  menthol-cetylpyridinium (CEPACOL) lozenge 3 mg (has no administration in time range)  acetaminophen (TYLENOL) tablet 650 mg (650 mg Oral Given 10/31/23 2310)  nicotine (NICODERM CQ - dosed in mg/24 hr) patch 7 mg (has no administration in time range)  ipratropium-albuterol (DUONEB) 0.5-2.5 (3) MG/3ML nebulizer solution 3 mL (has no administration in time range)  calcium gluconate 2 g/ 100 mL sodium chloride IVPB (has no administration in time range)  thiamine (VITAMIN B1) tablet 100 mg (has no administration in time range)  folic acid (FOLVITE) tablet 1 mg (has no administration in time range)  calcium gluconate 1 g/ 50 mL sodium chloride IVPB (0 mg Intravenous Stopped 10/31/23 1724)  potassium chloride SA (KLOR-CON M) CR tablet 20 mEq (20 mEq Oral Given 10/31/23 1623)  lactated ringers bolus 1,000 mL (1,000 mLs Intravenous New Bag/Given 10/31/23 1637)  magnesium sulfate IVPB 2 g 50 mL (0 g Intravenous Stopped 10/31/23 1955)  iohexol (OMNIPAQUE) 350 MG/ML injection 55 mL (55 mLs Intravenous Contrast Given 10/31/23 1933)  thiamine (VITAMIN B1) injection 100 mg (100 mg Intravenous Given 10/31/23 2311)    ED Course/ Medical Decision Making/ A&P Clinical Course as of 10/31/23 2346  Wed Oct 31, 2023  1757 Magnesium(!!): <0.5 Will transfuse [TY]  2345 Calcium(!!): 6.1 Repleted [TY]  2345 D-Dimer, Quant(!): 2.19 CTA ordered.  Negative for PE. [TY]  2345 CBC(!) Anemia noted.   No overt signs of bleeding. [TY]    Clinical Course User Index [TY] Coral Spikes, DO                                 Medical Decision Making This is a 63 year old female presenting emergency department for chest pain, neck pain.  She is afebrile, slightly elevated heart rate, hypertension noted.  Maintaining oxygen saturation on room air.  On exam, clear lungs.  Equal pulses.  EKG without ST segment changes to indicate ischemia on my independent interpretation.  Initial troponin with mild elevation at 19.  Patient did have  D-dimer drawn from triage which was elevated.  Concern for possible PE; lower suspicion for dissection. Will get CTA chest.  She does have some electrolyte derangements with low sodium and potassium, but most notably hypocalcemia.  Calcium ordered.  Does have a mild leukocytosis and she is anemic.  Chest x-ray without acute process.    See ED course for further MDM/disposition.  Amount and/or Complexity of Data Reviewed External Data Reviewed:     Details: Does not appear to be taking any medications that would cause hypocalcemia Labs: ordered. Decision-making details documented in ED Course.    Details: See above Radiology: ordered. Decision-making details documented in ED Course.  Risk Prescription drug management. Decision regarding hospitalization.         Final Clinical Impression(s) / ED Diagnoses Final diagnoses:  None    Rx / DC Orders ED Discharge Orders     None         Coral Spikes, DO 10/31/23 2346
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
## 2023-11-01 ENCOUNTER — Inpatient Hospital Stay (HOSPITAL_COMMUNITY)

## 2023-11-01 DIAGNOSIS — M5030 Other cervical disc degeneration, unspecified cervical region: Secondary | ICD-10-CM

## 2023-11-01 DIAGNOSIS — R079 Chest pain, unspecified: Secondary | ICD-10-CM | POA: Diagnosis not present

## 2023-11-01 DIAGNOSIS — N1831 Chronic kidney disease, stage 3a: Secondary | ICD-10-CM | POA: Diagnosis not present

## 2023-11-01 DIAGNOSIS — R7989 Other specified abnormal findings of blood chemistry: Secondary | ICD-10-CM | POA: Diagnosis not present

## 2023-11-01 DIAGNOSIS — N179 Acute kidney failure, unspecified: Secondary | ICD-10-CM | POA: Diagnosis not present

## 2023-11-01 DIAGNOSIS — E559 Vitamin D deficiency, unspecified: Secondary | ICD-10-CM | POA: Diagnosis not present

## 2023-11-01 LAB — COMPREHENSIVE METABOLIC PANEL
ALT: 9 U/L (ref 0–44)
AST: 22 U/L (ref 15–41)
Albumin: 2.4 g/dL — ABNORMAL LOW (ref 3.5–5.0)
Alkaline Phosphatase: 93 U/L (ref 38–126)
Anion gap: 15 (ref 5–15)
BUN: 13 mg/dL (ref 8–23)
CO2: 19 mmol/L — ABNORMAL LOW (ref 22–32)
Calcium: 7.2 mg/dL — ABNORMAL LOW (ref 8.9–10.3)
Chloride: 100 mmol/L (ref 98–111)
Creatinine, Ser: 1.65 mg/dL — ABNORMAL HIGH (ref 0.44–1.00)
GFR, Estimated: 35 mL/min — ABNORMAL LOW (ref >60)
Glucose, Bld: 86 mg/dL (ref 70–99)
Potassium: 3.3 mmol/L — ABNORMAL LOW (ref 3.5–5.1)
Sodium: 134 mmol/L — ABNORMAL LOW (ref 135–145)
Total Bilirubin: 0.7 mg/dL (ref 0.0–1.2)
Total Protein: 5.5 g/dL — ABNORMAL LOW (ref 6.5–8.1)

## 2023-11-01 LAB — ECHOCARDIOGRAM COMPLETE
AR max vel: 2.24 cm2
AV Area VTI: 2.18 cm2
AV Area mean vel: 2.03 cm2
AV Mean grad: 5 mmHg
AV Peak grad: 8.6 mmHg
Ao pk vel: 1.47 m/s
Area-P 1/2: 4.63 cm2
MV VTI: 3.13 cm2
S' Lateral: 2.5 cm

## 2023-11-01 LAB — CBC
HCT: 20.7 % — ABNORMAL LOW (ref 36.0–46.0)
Hemoglobin: 6.8 g/dL — CL (ref 12.0–15.0)
MCH: 32.4 pg (ref 26.0–34.0)
MCHC: 32.9 g/dL (ref 30.0–36.0)
MCV: 98.6 fL (ref 80.0–100.0)
Platelets: 212 10*3/uL (ref 150–400)
RBC: 2.1 MIL/uL — ABNORMAL LOW (ref 3.87–5.11)
RDW: 14.8 % (ref 11.5–15.5)
WBC: 10.7 10*3/uL — ABNORMAL HIGH (ref 4.0–10.5)
nRBC: 0 % (ref 0.0–0.2)

## 2023-11-01 LAB — IRON AND TIBC
Iron: 35 ug/dL (ref 28–170)
Saturation Ratios: 21 % (ref 10.4–31.8)
TIBC: 168 ug/dL — ABNORMAL LOW (ref 250–450)
UIBC: 133 ug/dL

## 2023-11-01 LAB — TSH: TSH: 1.773 u[IU]/mL (ref 0.350–4.500)

## 2023-11-01 LAB — MAGNESIUM: Magnesium: 1.9 mg/dL (ref 1.7–2.4)

## 2023-11-01 LAB — RETICULOCYTES
Immature Retic Fract: 19 % — ABNORMAL HIGH (ref 2.3–15.9)
RBC.: 2.07 MIL/uL — ABNORMAL LOW (ref 3.87–5.11)
Retic Count, Absolute: 33.9 10*3/uL (ref 19.0–186.0)
Retic Ct Pct: 1.6 % (ref 0.4–3.1)

## 2023-11-01 LAB — LIPID PANEL
Cholesterol: 124 mg/dL (ref 0–200)
HDL: 46 mg/dL (ref >40)
LDL Cholesterol: 61 mg/dL (ref 0–99)
Total CHOL/HDL Ratio: 2.7 ratio
Triglycerides: 83 mg/dL (ref <150)
VLDL: 17 mg/dL (ref 0–40)

## 2023-11-01 LAB — VITAMIN D 25 HYDROXY (VIT D DEFICIENCY, FRACTURES): Vit D, 25-Hydroxy: 8.19 ng/mL — ABNORMAL LOW (ref 30–100)

## 2023-11-01 LAB — OSMOLALITY, URINE: Osmolality, Ur: 245 mosm/kg — ABNORMAL LOW (ref 300–900)

## 2023-11-01 LAB — FOLATE: Folate: 6.3 ng/mL (ref >5.9)

## 2023-11-01 LAB — OSMOLALITY: Osmolality: 280 mosm/kg (ref 275–295)

## 2023-11-01 LAB — FERRITIN: Ferritin: 680 ng/mL — ABNORMAL HIGH (ref 11–307)

## 2023-11-01 LAB — VITAMIN B12: Vitamin B-12: 317 pg/mL (ref 180–914)

## 2023-11-01 MED ORDER — TRAMADOL HCL 50 MG PO TABS
50.0000 mg | ORAL_TABLET | Freq: Four times a day (QID) | ORAL | Status: DC | PRN
  Administered 2023-11-01 (×3): 50 mg via ORAL
  Filled 2023-11-01 (×4): qty 1

## 2023-11-01 MED ORDER — GABAPENTIN 100 MG PO CAPS: 100.0000 mg | ORAL_CAPSULE | Freq: Two times a day (BID) | ORAL | Status: DC

## 2023-11-01 MED ORDER — ATORVASTATIN CALCIUM 10 MG PO TABS
20.0000 mg | ORAL_TABLET | Freq: Every day | ORAL | Status: DC
  Administered 2023-11-01 – 2023-11-02 (×2): 20 mg via ORAL
  Filled 2023-11-01 (×2): qty 2

## 2023-11-01 MED ORDER — GABAPENTIN 100 MG PO CAPS
100.0000 mg | ORAL_CAPSULE | Freq: Three times a day (TID) | ORAL | Status: DC
  Administered 2023-11-01 – 2023-11-02 (×6): 100 mg via ORAL
  Filled 2023-11-01 (×6): qty 1

## 2023-11-01 MED ORDER — CALCIUM GLUCONATE-NACL 2-0.675 GM/100ML-% IV SOLN
2.0000 g | Freq: Once | INTRAVENOUS | Status: AC
  Administered 2023-11-01: 2000 mg via INTRAVENOUS
  Filled 2023-11-01: qty 100

## 2023-11-01 MED ORDER — ASPIRIN 81 MG PO TBEC
81.0000 mg | DELAYED_RELEASE_TABLET | Freq: Every day | ORAL | Status: DC
  Administered 2023-11-01 – 2023-11-02 (×2): 81 mg via ORAL
  Filled 2023-11-01 (×2): qty 1

## 2023-11-01 MED ORDER — POTASSIUM CHLORIDE CRYS ER 20 MEQ PO TBCR
40.0000 meq | EXTENDED_RELEASE_TABLET | Freq: Once | ORAL | Status: AC
  Administered 2023-11-01: 40 meq via ORAL
  Filled 2023-11-01: qty 2

## 2023-11-01 MED ORDER — VITAMIN D (ERGOCALCIFEROL) 1.25 MG (50000 UNIT) PO CAPS
50000.0000 [IU] | ORAL_CAPSULE | ORAL | Status: DC
  Administered 2023-11-01: 50000 [IU] via ORAL
  Filled 2023-11-01: qty 1

## 2023-11-01 MED ORDER — PERFLUTREN LIPID MICROSPHERE
1.0000 mL | INTRAVENOUS | Status: AC | PRN
  Administered 2023-11-01: 3 mL via INTRAVENOUS

## 2023-11-01 MED ORDER — ENSURE ENLIVE PO LIQD
237.0000 mL | Freq: Two times a day (BID) | ORAL | Status: DC
  Administered 2023-11-01 – 2023-11-02 (×4): 237 mL via ORAL
  Filled 2023-11-01: qty 237

## 2023-11-01 MED ORDER — CYCLOBENZAPRINE HCL 5 MG PO TABS
5.0000 mg | ORAL_TABLET | Freq: Three times a day (TID) | ORAL | Status: DC | PRN
  Administered 2023-11-01 (×3): 5 mg via ORAL
  Filled 2023-11-01 (×3): qty 1

## 2023-11-01 MED ORDER — SODIUM CHLORIDE 0.9 % IV SOLN
125.0000 mg | Freq: Once | INTRAVENOUS | Status: AC
  Administered 2023-11-01: 125 mg via INTRAVENOUS
  Filled 2023-11-01: qty 10

## 2023-11-01 MED ORDER — SODIUM CHLORIDE 0.9 % IV BOLUS
1000.0000 mL | Freq: Once | INTRAVENOUS | Status: AC
  Administered 2023-11-01: 1000 mL via INTRAVENOUS

## 2023-11-01 NOTE — Progress Notes (Signed)
 PROGRESS NOTE                                                                                                                                                                                                             Patient Demographics:    Kelsey Poole, is a 63 y.o. female, DOB - 01-Sep-1961, WUJ:[id number]  Outpatient Primary MD for the patient is Pcp, No    LOS - 1  Admit date - 10/31/2023    Chief Complaint  Patient presents with   Chest Pain   Ear Fullness       Brief Narrative (HPI from H&P)    Kelsey Poole is a 63 y.o. female with medical history significant of ckd stage IIIb, chronic alcohol use, macrocytic anemia, chronic hyponatremia, chronic hypomagnesemia, chronic hypokalemia, chronic smoking of cigarette, reactive airway disease, carpal tunnel syndrome essential hypertension presented to emergency department with complaining of left-sided ear pain, sore throat for past 4 days, patient reported that he has been experiencing shooting pain to her spine radiate to the left-sided chest and left-sided arm. Patient noted chest pain has been improved but is still having some pain in her neck.  Denies any fever, chill, neck stiffness, blurry vision, weakness and headache.    Subjective:    Kelsey Poole was evaluated at the bed side. I did have her echocardiogram at the bedside. She continues to have upper back and neck pain but denies any chest pain. Tells me her fianc passed away in July 24, 2024 because of grieving, she has had poor appetite and not eating enough food. She is pending referral to grief counseling by her PCP.   Assessment  & Plan :    Assessment and Plan: AKI on CKD stage 3A/3B - Baseline creatinine around 1.5-1.4. Secondary to dehydration from poor p.o. intake.  --Unremarkable renal ultrasound -Creatinine improved from 1.80 to 1.65 with IV fluids. -Increase IV NS to 75 cc/h  Cervical  radiculopathy - Patient is complaining about left-sided neck pain that radiates to the left-sided shoulder, left-sided forearm and arm. - MRI cervical spine showed multi factorial degenerative change resulting foraminal narrowing C3-C4 and C6-6 7.  - Continue gabapentin 100 mg 3 times daily and Flexeril 5 mg 3 times daily as needed. - Patient should be follow-up with PCP to receive referral to neurosurgery outpatient.  Acute on  chronic hyponatremia - Patient has history of recurrent hyponatremia in the past in the setting of chronic alcohol use - Sodium slightly improved to 134. - Continue NS 75 cc/h - Continue to monitor electrolytes.   Hypokalemia - Potassium still low at 3.3. - Repleting with oral KCl   Hypomagnesemia, resolved - Low magnesium less than 0.5 - Repleted with IV mag 2 mg x 2 doses --Mag improved to 1.9 today   Vitamin D deficiency Hypocalcemia - Serum calcium level 6.1 on admission.  Status post a total of 3 g IV calcium.  -- Potassium improved to 7.2. Corrected calcium of 8.5 still low. Vitamin D significantly low at 8.19, likely cause of her hypocalcemia  -- Start vitamin D 50,000 units every 7 days for 8 doses --Give additional IV calcium 2 g --Follow-up PTH   Chronic macrocytic anemia - Baseline hemoglobin around 7.4-9.2. Hemoglobin dropped to 6.8 however patient refused blood transfusion for religious reasons.  Iron studies more consistent with anemia of chronic disease.  Normal folate and vitamin B12.  Patient okay with receiving IV iron. --Give IV Ferrlecit - Continue to monitor CBC   Elevated troponin secondary to demand ischemia in the setting of electrolyte derangement - Troponin level 18 and 19. Blood troponin without any delta change. - EKG showed sinus tachycardia heart rate 104. T wave abnormality in lead inferior leads.   -- Case discussed with cardiology on admission and his symptoms felt not to be due to ACS. --Patient denied any chest pain  today --Cardiology signed off   Chronic alcohol use -Patient continues to drink 16 ounces of alcohol in 19. No evidence of EtOH withdrawal. - CTM   Chronic smoking of cigarette -Patient reports smoking 10 to 12 cigarettes in a day. -- Patient counseled on smoking cessation, states she will work on this --Continue nicotine patch   Reactive airway disease -Patient reported history of COPD or asthma but never have any formal diagnosis in the past report currently having productive cough. Also continues to smoke - Continue DuoNeb as needed.   Essential hypertension Elevated blood pressure -BP has been stable with SBP in the 120s to 130s. - CTM   Nutrition Problem:        Obesity: Estimated body mass index is 20.73 kg/m as calculated from the following:   Height as of 02/09/23: 5\' 11"  (1.803 m).   Weight as of 02/09/23: 67.4 kg.          Condition -good  Family Communication  : No family at bedside  Code Status : Full  Consults  : Cardiology, signed off  PUD Prophylaxis : SCDs   Procedures  :     None      Disposition Plan  :    Status is: Inpatient Remains inpatient appropriate because: Ongoing electrolyte repletion and management of anemia  DVT Prophylaxis  :    SCDs Start: 10/31/23 2032 Place TED hose Start: 10/31/23 2032     Lab Results  Component Value Date   PLT 212 11/01/2023    Diet :  Diet Order             Diet Heart Room service appropriate? Yes; Fluid consistency: Thin  Diet effective now                    Inpatient Medications  Scheduled Meds:  aspirin EC  81 mg Oral Daily   atorvastatin  20 mg Oral Daily   folic acid  1 mg  Oral Daily   gabapentin  100 mg Oral TID   nicotine  7 mg Transdermal Daily   sodium chloride flush  3 mL Intravenous Q12H   sodium chloride flush  3 mL Intravenous Q12H   thiamine  100 mg Oral Daily   Continuous Infusions:  sodium chloride 50 mL/hr at 10/31/23 2319   sodium chloride     PRN  Meds:.sodium chloride, acetaminophen, cyclobenzaprine, ipratropium-albuterol, menthol-cetylpyridinium, ondansetron **OR** ondansetron (ZOFRAN) IV, sodium chloride flush, traMADol  Antibiotics  :    Anti-infectives (From admission, onward)    None         Objective:   Vitals:   11/01/23 0515 11/01/23 0530 11/01/23 0600 11/01/23 0711  BP:   (!) 142/91   Pulse: 94 96 94 98  Resp: 16 18 16 13   Temp:   98.9 F (37.2 C)   TempSrc:      SpO2: 96% 99% 98% 100%    Wt Readings from Last 3 Encounters:  02/09/23 67.4 kg  11/14/22 66.8 kg  10/30/22 71.7 kg     Intake/Output Summary (Last 24 hours) at 11/01/2023 1610 Last data filed at 11/01/2023 0422 Gross per 24 hour  Intake 200 ml  Output --  Net 200 ml     Physical Exam  General: Pleasant, well-appearing woman laying in bed. No acute distress. HEENT: [place]/AT. Anicteric sclera. Dry mucous membrane. CV: Mild tachycardia. Regular rhythm. No murmurs, rubs, or gallops. No LE edema Pulmonary: Lungs CTAB. Normal effort. No wheezing or rales. Abdominal: Soft, NT/ND. Normal bowel sounds. MSK: Mild tenderness to palpation of the C-spine. Normal ROM Skin: Warm and dry. No obvious rash or lesions. Neuro: A&Ox3. Moves all extremities. Normal sensation to light touch. No focal deficit. Psych: Normal mood and affect    RN pressure injury documentation:      Data Review:    Recent Labs  Lab 10/31/23 1430 11/01/23 0448  WBC 12.5* 10.7*  HGB 7.2* 6.8*  HCT 21.6* 20.7*  PLT 230 212  MCV 97.3 98.6  MCH 32.4 32.4  MCHC 33.3 32.9  RDW 14.9 14.8    Recent Labs  Lab 10/31/23 1430 10/31/23 1443 10/31/23 1616 11/01/23 0448  NA 133*  --   --  134*  K 3.3*  --   --  3.3*  CL 99  --   --  100  CO2 19*  --   --  19*  ANIONGAP 15  --   --  15  GLUCOSE 105*  --   --  86  BUN 14  --   --  13  CREATININE 1.80*  --   --  1.65*  AST  --   --   --  22  ALT  --   --   --  9  ALKPHOS  --   --   --  93  BILITOT  --   --   --  0.7   ALBUMIN  --   --   --  2.4*  DDIMER  --  2.19*  --   --   MG  --   --  <0.5* 1.9  CALCIUM 6.1*  --   --  7.2*      Recent Labs  Lab 10/31/23 1430 10/31/23 1443 10/31/23 1616 11/01/23 0448  DDIMER  --  2.19*  --   --   MG  --   --  <0.5* 1.9  CALCIUM 6.1*  --   --  7.2*    ---------------------------------------------------------------------------------------------------------------  Lab Results  Component Value Date   CHOL 124 11/01/2023   HDL 46 11/01/2023   LDLCALC 61 11/01/2023   TRIG 83 11/01/2023   CHOLHDL 2.7 11/01/2023    Lab Results  Component Value Date   HGBA1C 5.0 01/11/2021   No results for input(s): "TSH", "T4TOTAL", "FREET4", "T3FREE", "THYROIDAB" in the last 72 hours. No results for input(s): "VITAMINB12", "FOLATE", "FERRITIN", "TIBC", "IRON", "RETICCTPCT" in the last 72 hours. ------------------------------------------------------------------------------------------------------------------ Cardiac Enzymes No results for input(s): "CKMB", "TROPONINI", "MYOGLOBIN" in the last 168 hours.  Invalid input(s): "CK"  Micro Results No results found for this or any previous visit (from the past 240 hours).  Radiology Reports MR CERVICAL SPINE WO CONTRAST Result Date: 11/01/2023 CLINICAL DATA:  Initial evaluation for chronic neck pain. EXAM: MRI CERVICAL SPINE WITHOUT CONTRAST TECHNIQUE: Multiplanar, multisequence MR imaging of the cervical spine was performed. No intravenous contrast was administered. COMPARISON:  None Available. FINDINGS: Alignment: Straightening of the normal cervical lordosis. Underlying mild levoscoliosis. No significant listhesis. Vertebrae: Vertebral body height maintained without acute or chronic fracture. Bone marrow signal intensity diffusely heterogeneous but overall within normal limits. No worrisome osseous lesions. No abnormal marrow edema. Cord: Normal signal and morphology. Posterior Fossa, vertebral arteries, paraspinal tissues:  Unremarkable. Disc levels: C2-C3: Mild disc bulge with uncovertebral spurring. Mild left-sided facet hypertrophy. No significant spinal stenosis. Foramina remain patent. C3-C4: Degenerative intervertebral disc space narrowing with diffuse disc osteophyte complex. Flattening and partial effacement of the ventral thecal sac with mild cord flattening, but no cord signal changes. Superimposed mild facet hypertrophy. Resultant moderate spinal stenosis. Mild to moderate left with moderate to severe right C4 foraminal stenosis. C4-C5: Degenerative vertebral disc space narrowing with diffuse disc osteophyte complex. Flattening and partial effacement of the ventral thecal sac. Superimposed mild right-sided facet hypertrophy. Resultant moderate spinal stenosis. Moderate to severe right with moderate left C5 foraminal stenosis. C5-C6: Degenerative vertebral disc space narrowing with diffuse disc osteophyte complex. Broad posterior component flattens and partially effaces the ventral thecal sac. Superimposed small central soft disc protrusion (series 9, image 27). Mild to moderate spinal stenosis. Mild right C6 foraminal narrowing. Left neural foramina remains patent. C6-C7: Diffuse disc bulge with bilateral uncovertebral spurring. Flattening and partial effacement of the ventral thecal sac. Superimposed small central soft disc protrusion (series 9, image 31). Mild cord flattening without cord signal changes, worse on the left. Mild to moderate spinal stenosis. Foramina remain patent. C7-T1: Right-sided uncovertebral spurring without significant disc bulge. Mild right-sided facet hypertrophy. No significant spinal stenosis. Mild right C8 foraminal narrowing. Left neural foramina remains patent. IMPRESSION: 1. No acute abnormality within the cervical spine. 2. Moderate cervical spondylosis with resultant mild to moderate diffuse spinal stenosis at C3-4 through C6-7. 3. Multifactorial degenerative changes with resultant  multilevel foraminal narrowing as above. Notable findings include moderate to severe right C4 and C5 foraminal stenosis, with mild to moderate left C4 and C5 foraminal narrowing. Electronically Signed   By: Rise Mu M.D.   On: 11/01/2023 00:18   US RENAL Result Date: 10/31/2023 CLINICAL DATA:  Acute kidney injury EXAM: RENAL / URINARY TRACT ULTRASOUND COMPLETE COMPARISON:  Renal ultrasound 10/21/2022 FINDINGS: Right Kidney: Renal measurements: 9.9 x 5.1 x 4.4 cm = volume: 117 mL. Echogenicity is increased. No mass or hydronephrosis visualized. Left Kidney: Renal measurements: 10.3 x 4.9 x 4.0 cm = volume: 107 mL. Echogenicity is increased. No mass or hydronephrosis visualized. Bladder: Appears normal for degree of bladder distention. Other: None. IMPRESSION: 1. Increased echogenicity of the  kidneys as can be seen in medical renal disease. 2. No hydronephrosis. Electronically Signed   By: Darliss Cheney M.D.   On: 10/31/2023 21:46   CT Angio Chest PE W and/or Wo Contrast Result Date: 10/31/2023 CLINICAL DATA:  Positive D-dimer, left-sided chest pain radiating to back and arm EXAM: CT ANGIOGRAPHY CHEST WITH CONTRAST TECHNIQUE: Multidetector CT imaging of the chest was performed using the standard protocol during bolus administration of intravenous contrast. Multiplanar CT image reconstructions and MIPs were obtained to evaluate the vascular anatomy. RADIATION DOSE REDUCTION: This exam was performed according to the departmental dose-optimization program which includes automated exposure control, adjustment of the mA and/or kV according to patient size and/or use of iterative reconstruction technique. CONTRAST:  55mL OMNIPAQUE IOHEXOL 350 MG/ML SOLN COMPARISON:  04/09/2023 FINDINGS: Cardiovascular: This is a technically adequate evaluation of the pulmonary vasculature. No filling defects or pulmonary emboli. The heart is unremarkable without pericardial effusion. No evidence of thoracic aortic aneurysm  or dissection. Atherosclerosis of the aorta and coronary vasculature. Mediastinum/Nodes: No enlarged mediastinal, hilar, or axillary lymph nodes. Thyroid gland, trachea, and esophagus demonstrate no significant findings. Lungs/Pleura: Mild emphysema. No acute airspace disease, effusion, or pneumothorax. Minimal subsegmental atelectasis or scarring within the dependent lower lobes. Central airways are patent. Upper Abdomen: Hepatic steatosis. No acute upper abdominal findings. Musculoskeletal: No acute or destructive bony abnormalities. Reconstructed images demonstrate no additional findings. Review of the MIP images confirms the above findings. IMPRESSION: 1. No evidence of pulmonary embolus. 2. No acute intrathoracic process. 3. Aortic Atherosclerosis (ICD10-I70.0) and Emphysema (ICD10-J43.9). 4. Coronary artery atherosclerosis. Electronically Signed   By: Sharlet Salina M.D.   On: 10/31/2023 19:56   DG Chest 2 View Result Date: 10/31/2023 CLINICAL DATA:  Chest pain. EXAM: CHEST - 2 VIEW COMPARISON:  October 20, 2022. FINDINGS: The heart size and mediastinal contours are within normal limits. Both lungs are clear. The visualized skeletal structures are unremarkable. IMPRESSION: No active cardiopulmonary disease. Electronically Signed   By: Lupita Raider M.D.   On: 10/31/2023 16:06      Signature  -   Steffanie Rainwater M.D on 11/01/2023 at 8:28 AM   -  To page go to [email or web address]

## 2023-11-01 NOTE — ED Notes (Signed)
 Provider called about patients hemoglobin. Due to rebellious reasons patient is refusing blood at this time. MD made aware. Patient is on full cardiac monitor with pulse ox. Patient is in no noted distress at the present time will continue to monitor for any changes.

## 2023-11-01 NOTE — Evaluation (Signed)
 Occupational Therapy Evaluation Patient Details Name: Kelsey Poole MRN: [id number] DOB: July 05, 1961 Today's Date: 11/01/2023   History of Present Illness   Pt is a 63 y/o F admitted on 10/31/23 after presenting via EMS from her doctor's office. Pt presents with c/o L sided ear pain, sore throat x 4 days, shooting pain to her spine, radiating to L side of chest & L arm. MRI cervical spine showing multi factorial degenerative change resulting foraminal narrowing C3-C4 and C6-7. Pt also being treated for electrolyte derangement. PMH: CKD 3B, chronic alcohol use, macrocytic anemia, chronic hyponatremia, chronic hypomagnesemia, chronic hypokalemia, smoking, reactive airway disease, CTS, essential HTN     Clinical Impressions Pt eval by OT with no further acute OT needs identified. All education has been completed and the pt has no further questions. Pt functioning at baseline level and is independent with ADLs. No follow up OT recommended. See below for recommended DME. OT is signing off. Thank you for the referral.     If plan is discharge home, recommend the following:   A little help with walking and/or transfers;Assist for transportation;Help with stairs or ramp for entrance     Functional Status Assessment   Patient has not had a recent decline in their functional status     Equipment Recommendations   BSC/3in1;Tub/shower seat     Recommendations for Other Services         Precautions/Restrictions         Mobility Bed Mobility Overal bed mobility: Modified Independent Bed Mobility: Supine to Sit, Sit to Supine     Supine to sit: Modified independent (Device/Increase time), HOB elevated Sit to supine: Modified independent (Device/Increase time), HOB elevated        Transfers Overall transfer level: Modified independent Equipment used: None Transfers: Sit to/from Stand Sit to Stand: Independent, Modified independent (Device/Increase time)                   Balance Overall balance assessment: Needs assistance Sitting-balance support: Feet supported Sitting balance-Leahy Scale: Good     Standing balance support: During functional activity, No upper extremity supported Standing balance-Leahy Scale: Fair                             ADL either performed or assessed with clinical judgement   ADL Overall ADL's : Independent                                             Vision Baseline Vision/History: 0 No visual deficits Vision Assessment?: No apparent visual deficits     Perception         Praxis         Pertinent Vitals/Pain Pain Assessment Pain Assessment: Faces Faces Pain Scale: Hurts whole lot Pain Location: Neck Pain Descriptors / Indicators: Constant, Discomfort, Grimacing Pain Intervention(s): Limited activity within patient's tolerance     Extremity/Trunk Assessment Upper Extremity Assessment Upper Extremity Assessment: Overall WFL for tasks assessed   Lower Extremity Assessment Lower Extremity Assessment: Defer to PT evaluation       Communication Communication Communication: No apparent difficulties   Cognition Arousal: Alert Behavior During Therapy: [facility] for tasks assessed/performed  Following commands: Intact       Cueing  General Comments   Cueing Techniques: Verbal cues      Exercises     Shoulder Instructions      Home Living Family/patient expects to be discharged to:: Private residence Living Arrangements: Alone   Type of Home: House Home Access: Stairs to enter [employer] of Steps: 4-5 Entrance Stairs-Rails: None Home Layout: One level     Bathroom Shower/Tub: IT trainer: Standard Bathroom Accessibility: Yes How Accessible: Accessible via walker Home Equipment: Cane - quad   Additional Comments: Closest family is Programme researcher, broadcasting/film/video, recent widow       Prior Functioning/Environment Prior Level of Function : Independent/Modified Independent             Mobility Comments: takes public transportation & notes 3 falls getting onto the bus, almost unable to stand up from bus seat recently ADLs Comments: Independent with ADLs and IADLs, no driving    OT Problem List: Decreased strength;Decreased activity tolerance;Impaired balance (sitting and/or standing)   OT Treatment/Interventions:        OT Goals(Current goals can be found in the care plan section)   Acute Rehab OT Goals Patient Stated Goal: To not be in pain   OT Frequency:       Co-evaluation              AM-PAC OT "6 Clicks" Daily Activity     Outcome Measure Help from another person eating meals?: None Help from another person taking care of personal grooming?: None Help from another person toileting, which includes using toliet, bedpan, or urinal?: None Help from another person bathing (including washing, rinsing, drying)?: None Help from another person to put on and taking off regular upper body clothing?: None Help from another person to put on and taking off regular lower body clothing?: None 6 Click Score: 24   End of Session Nurse Communication: Patient requests pain meds  Activity Tolerance: Patient limited by pain Patient left: in bed;with call bell/phone within reach (Pt declined bed alarm)  OT Visit Diagnosis: Unsteadiness on feet (R26.81);Other abnormalities of gait and mobility (R26.89)                Time: 1558-1610 OT Time Calculation (min): 12 min Charges:  OT General Charges $OT Visit: 1 Visit OT Evaluation $OT Eval Low Complexity: 1 Low  Ivor Messier, OT  Acute Rehabilitation Services Office [phone] Secure chat preferred   Marilynne Drivers 11/01/2023, 4:23 PM

## 2023-11-01 NOTE — ED Notes (Signed)
 Just assumed care of patient. Patient placed on full cardiac monitor with pulse ox. Patient given medications for pain and night medication. Patient is A&O times 4. Patient is in no noted distress at the present time will continue to monitor for any changes.

## 2023-11-01 NOTE — Progress Notes (Signed)
 Echocardiogram 2D Echocardiogram has been performed.  Kelsey Poole [name] 11/01/2023, 9:08 AM

## 2023-11-01 NOTE — Progress Notes (Signed)
 Rounding Note    Patient Name: Kelsey Poole Date of Encounter: 11/01/2023  [facility] Cardiologist: None   Subjective   She has acute back pain  Inpatient Medications    Scheduled Meds:  folic acid  1 mg Oral Daily   gabapentin  100 mg Oral TID   nicotine  7 mg Transdermal Daily   potassium chloride  40 mEq Oral Once   sodium chloride flush  3 mL Intravenous Q12H   sodium chloride flush  3 mL Intravenous Q12H   thiamine  100 mg Oral Daily   Continuous Infusions:  sodium chloride 50 mL/hr at 10/31/23 2319   sodium chloride     PRN Meds: sodium chloride, acetaminophen, cyclobenzaprine, ipratropium-albuterol, menthol-cetylpyridinium, ondansetron **OR** ondansetron (ZOFRAN) IV, sodium chloride flush, traMADol   Vital Signs    Vitals:   11/01/23 0515 11/01/23 0530 11/01/23 0600 11/01/23 0711  BP:   (!) 142/91   Pulse: 94 96 94 98  Resp: 16 18 16 13   Temp:   98.9 F (37.2 C)   TempSrc:      SpO2: 96% 99% 98% 100%    Intake/Output Summary (Last 24 hours) at 11/01/2023 0752 Last data filed at 11/01/2023 0422 Gross per 24 hour  Intake 200 ml  Output --  Net 200 ml      02/09/2023    1:37 PM 11/14/2022    4:05 PM 10/30/2022    9:05 AM  Last 3 Weights  Weight (lbs) 148 lb 9.6 oz 147 lb 3.2 oz 158 lb  Weight (kg) 67.405 kg 66.769 kg 71.668 kg      Telemetry    Sinus rhythm- Personally Reviewed  ECG    No new - Personally Reviewed  Physical Exam   Vitals:   11/01/23 0600 11/01/23 0711  BP: (!) 142/91   Pulse: 94 98  Resp: 16 13  Temp: 98.9 F (37.2 C)   SpO2: 98% 100%    GEN: No acute distress.   Neck: No JVD Cardiac: RRR, no murmurs, rubs, or gallops.  Respiratory: Clear to auscultation bilaterally. GI: Soft, nontender, non-distended  MS: No edema; No deformity. Neuro:  Nonfocal  Psych: Normal affect   Labs    High Sensitivity Troponin:   Recent Labs  Lab 10/31/23 1430 10/31/23 1616  TROPONINIHS 19* 18*      Chemistry Recent Labs  Lab 10/31/23 1430 10/31/23 1616 11/01/23 0448  NA 133*  --  134*  K 3.3*  --  3.3*  CL 99  --  100  CO2 19*  --  19*  GLUCOSE 105*  --  86  BUN 14  --  13  CREATININE 1.80*  --  1.65*  CALCIUM 6.1*  --  7.2*  MG  --  <0.5* 1.9  PROT  --   --  5.5*  ALBUMIN  --   --  2.4*  AST  --   --  22  ALT  --   --  9  ALKPHOS  --   --  93  BILITOT  --   --  0.7  GFRNONAA 31*  --  35*  ANIONGAP 15  --  15    Lipids  Recent Labs  Lab 11/01/23 0448  CHOL 124  TRIG 83  HDL 46  LDLCALC 61  CHOLHDL 2.7    Hematology Recent Labs  Lab 10/31/23 1430 11/01/23 0448  WBC 12.5* 10.7*  RBC 2.22* 2.10*  HGB 7.2* 6.8*  HCT 21.6*  20.7*  MCV 97.3 98.6  MCH 32.4 32.4  MCHC 33.3 32.9  RDW 14.9 14.8  PLT 230 212   Thyroid No results for input(s): "TSH", "FREET4" in the last 168 hours.  BNPNo results for input(s): "BNP", "PROBNP" in the last 168 hours.  DDimer  Recent Labs  Lab 10/31/23 1443  DDIMER 2.19*     Radiology    MR CERVICAL SPINE WO CONTRAST Result Date: 11/01/2023 CLINICAL DATA:  Initial evaluation for chronic neck pain. EXAM: MRI CERVICAL SPINE WITHOUT CONTRAST TECHNIQUE: Multiplanar, multisequence MR imaging of the cervical spine was performed. No intravenous contrast was administered. COMPARISON:  None Available. FINDINGS: Alignment: Straightening of the normal cervical lordosis. Underlying mild levoscoliosis. No significant listhesis. Vertebrae: Vertebral body height maintained without acute or chronic fracture. Bone marrow signal intensity diffusely heterogeneous but overall within normal limits. No worrisome osseous lesions. No abnormal marrow edema. Cord: Normal signal and morphology. Posterior Fossa, vertebral arteries, paraspinal tissues: Unremarkable. Disc levels: C2-C3: Mild disc bulge with uncovertebral spurring. Mild left-sided facet hypertrophy. No significant spinal stenosis. Foramina remain patent. C3-C4: Degenerative intervertebral disc  space narrowing with diffuse disc osteophyte complex. Flattening and partial effacement of the ventral thecal sac with mild cord flattening, but no cord signal changes. Superimposed mild facet hypertrophy. Resultant moderate spinal stenosis. Mild to moderate left with moderate to severe right C4 foraminal stenosis. C4-C5: Degenerative vertebral disc space narrowing with diffuse disc osteophyte complex. Flattening and partial effacement of the ventral thecal sac. Superimposed mild right-sided facet hypertrophy. Resultant moderate spinal stenosis. Moderate to severe right with moderate left C5 foraminal stenosis. C5-C6: Degenerative vertebral disc space narrowing with diffuse disc osteophyte complex. Broad posterior component flattens and partially effaces the ventral thecal sac. Superimposed small central soft disc protrusion (series 9, image 27). Mild to moderate spinal stenosis. Mild right C6 foraminal narrowing. Left neural foramina remains patent. C6-C7: Diffuse disc bulge with bilateral uncovertebral spurring. Flattening and partial effacement of the ventral thecal sac. Superimposed small central soft disc protrusion (series 9, image 31). Mild cord flattening without cord signal changes, worse on the left. Mild to moderate spinal stenosis. Foramina remain patent. C7-T1: Right-sided uncovertebral spurring without significant disc bulge. Mild right-sided facet hypertrophy. No significant spinal stenosis. Mild right C8 foraminal narrowing. Left neural foramina remains patent. IMPRESSION: 1. No acute abnormality within the cervical spine. 2. Moderate cervical spondylosis with resultant mild to moderate diffuse spinal stenosis at C3-4 through C6-7. 3. Multifactorial degenerative changes with resultant multilevel foraminal narrowing as above. Notable findings include moderate to severe right C4 and C5 foraminal stenosis, with mild to moderate left C4 and C5 foraminal narrowing. Electronically Signed   By: Rise Mu M.D.   On: 11/01/2023 00:18   US RENAL Result Date: 10/31/2023 CLINICAL DATA:  Acute kidney injury EXAM: RENAL / URINARY TRACT ULTRASOUND COMPLETE COMPARISON:  Renal ultrasound 10/21/2022 FINDINGS: Right Kidney: Renal measurements: 9.9 x 5.1 x 4.4 cm = volume: 117 mL. Echogenicity is increased. No mass or hydronephrosis visualized. Left Kidney: Renal measurements: 10.3 x 4.9 x 4.0 cm = volume: 107 mL. Echogenicity is increased. No mass or hydronephrosis visualized. Bladder: Appears normal for degree of bladder distention. Other: None. IMPRESSION: 1. Increased echogenicity of the kidneys as can be seen in medical renal disease. 2. No hydronephrosis. Electronically Signed   By: Darliss Cheney M.D.   On: 10/31/2023 21:46   CT Angio Chest PE W and/or Wo Contrast Result Date: 10/31/2023 CLINICAL DATA:  Positive D-dimer, left-sided chest pain radiating  to back and arm EXAM: CT ANGIOGRAPHY CHEST WITH CONTRAST TECHNIQUE: Multidetector CT imaging of the chest was performed using the standard protocol during bolus administration of intravenous contrast. Multiplanar CT image reconstructions and MIPs were obtained to evaluate the vascular anatomy. RADIATION DOSE REDUCTION: This exam was performed according to the departmental dose-optimization program which includes automated exposure control, adjustment of the mA and/or kV according to patient size and/or use of iterative reconstruction technique. CONTRAST:  55mL OMNIPAQUE IOHEXOL 350 MG/ML SOLN COMPARISON:  04/09/2023 FINDINGS: Cardiovascular: This is a technically adequate evaluation of the pulmonary vasculature. No filling defects or pulmonary emboli. The heart is unremarkable without pericardial effusion. No evidence of thoracic aortic aneurysm or dissection. Atherosclerosis of the aorta and coronary vasculature. Mediastinum/Nodes: No enlarged mediastinal, hilar, or axillary lymph nodes. Thyroid gland, trachea, and esophagus demonstrate no significant  findings. Lungs/Pleura: Mild emphysema. No acute airspace disease, effusion, or pneumothorax. Minimal subsegmental atelectasis or scarring within the dependent lower lobes. Central airways are patent. Upper Abdomen: Hepatic steatosis. No acute upper abdominal findings. Musculoskeletal: No acute or destructive bony abnormalities. Reconstructed images demonstrate no additional findings. Review of the MIP images confirms the above findings. IMPRESSION: 1. No evidence of pulmonary embolus. 2. No acute intrathoracic process. 3. Aortic Atherosclerosis (ICD10-I70.0) and Emphysema (ICD10-J43.9). 4. Coronary artery atherosclerosis. Electronically Signed   By: Sharlet Salina M.D.   On: 10/31/2023 19:56   DG Chest 2 View Result Date: 10/31/2023 CLINICAL DATA:  Chest pain. EXAM: CHEST - 2 VIEW COMPARISON:  October 20, 2022. FINDINGS: The heart size and mediastinal contours are within normal limits. Both lungs are clear. The visualized skeletal structures are unremarkable. IMPRESSION: No active cardiopulmonary disease. Electronically Signed   By: Lupita Raider M.D.   On: 10/31/2023 16:06    Cardiac Studies   Pending TTE  Patient Profile     Kelsey Poole is a 63 y.o. female with a hx of HTN, CKD 3, EtOH use disorder and tobacco use disorder who is being seen 10/31/2023 for the evaluation of chest pain and ECG changes at the request of Dr. Janalyn Shy.   Assessment & Plan    Non cardiac CP Trop very mild, no significant ischemic changes on her ecg. Her coronary CTA does show plaque in the LAD.  Can medically manage this.  Her symptoms are clearly not related to an MI.  She has significant cervical disc disease, likely the main etiology for her pain.  Can follow-up the echocardiogram and if it shows normal EF and no wall motion abnormalities she can be discharged from a cardiology perspective. -start lipitor 20 mg daily [LDL at goal 61 mg/dL] and aspirin 81 mg daily  Acute on chronic macrocytic anemia - per  IM    For questions or updates, please contact [facility] Please consult [email or web address] for contact info under        Signed, Maisie Fus, MD  11/01/2023, 7:52 AM

## 2023-11-01 NOTE — Evaluation (Signed)
 Physical Therapy Evaluation Patient Details Name: Kelsey Poole MRN: [id number] DOB: 19-Nov-1960 Today's Date: 11/01/2023  History of Present Illness  Pt is a 63 y/o F admitted on 10/31/23 after presenting via EMS from her doctor's office. Pt presents with c/o L sided ear pain, sore throat x 4 days, shooting pain to her spine, radiating to L side of chest & L arm. MRI cervical spine showing multi factorial degenerative change resulting foraminal narrowing C3-C4 and C6-7. Pt also being treated for electrolyte derangement. PMH: CKD 3B, chronic alcohol use, macrocytic anemia, chronic hyponatremia, chronic hypomagnesemia, chronic hypokalemia, smoking, reactive airway disease, CTS, essential HTN  Clinical Impression  Pt seen for PT evaluation with pt agreeable to tx. Pt reports prior to admission she was living alone in a 1 level home with 4-5 steps without rails to enter, ambulatory without AD, but notes ~3 falls trying to get on the public transportation bus. 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 On this date, pt is able to ambulate with impaired gait pattern, progressing to more normalized gait pattern without AD with supervision.  Pt requires min assist to negotiate 3 steps with 1 rail as pt is limited by pain & weakness. Pt would benefit from acute PT services to address strengthening, stair negotiation to help reduce fall risk prior to return home alone.      If plan is discharge home, recommend the following: Help with stairs or ramp for entrance   Can travel by private vehicle        Equipment Recommendations Other (comment) (TBD)  Recommendations for Other Services       Functional Status Assessment Patient has had a recent decline in their functional status and demonstrates the ability to make significant improvements in function in a reasonable and predictable amount of time.     Precautions / Restrictions  Precautions Precautions: Fall Restrictions Weight Bearing Restrictions Per Provider Order: No      Mobility  Bed Mobility Overal bed mobility: Modified Independent Bed Mobility: Supine to Sit, Sit to Supine     Supine to sit: Modified independent (Device/Increase time), HOB elevated, Used rails Sit to supine: Modified independent (Device/Increase time), HOB elevated, Used rails (extra time to elevate BLE onto bed)        Transfers Overall transfer level: Needs assistance Equipment used: None Transfers: Sit to/from Stand Sit to Stand: Supervision                Ambulation/Gait Ambulation/Gait assistance: Supervision Gait Distance (Feet): 200 Feet Assistive device: None   Gait velocity: decreased     General Gait Details: Pt initially ambulates with shuffled gait with absent heel strike but pt able to progress to more normalized gait pattern with BLE dorsiflexion & heel strike as gait progressed.  Stairs Stairs: Yes Stairs assistance: Min assist Stair Management: One rail Left, Step to pattern Number of Stairs: 3 (6") General stair comments: PT provides education re: compensatory pattern. Pt requires extra time/energy to power up each step 2/2 BLE weakness & knee pain.  Wheelchair Mobility     Tilt Bed    Modified Rankin (Stroke Patients Only)       Balance Overall balance assessment: Needs assistance Sitting-balance support: Feet supported Sitting balance-Leahy Scale: Good Sitting balance - Comments: dons shoes sitting EOB without LOB   Standing balance support: During functional activity, No upper extremity supported Standing balance-Leahy Scale: Fair  Pertinent Vitals/Pain Pain Assessment Pain Assessment: 0-10 Pain Score: 6  Pain Location: center of thoracic spine, R knee Pain Descriptors / Indicators: Discomfort, Sharp Pain Intervention(s): Monitored during session    Home Living Family/patient  expects to be discharged to:: Private residence Living Arrangements: Alone   Type of Home: House Home Access: Stairs to enter Entrance Stairs-Rails: None Secretary/administrator of Steps: 4-5   Home Layout: One level Home Equipment: None      Prior Function               Mobility Comments: takes public transportation & notes 3 falls getting onto the bus, almost unable to stand up from bus seat recently       Extremity/Trunk Assessment   Upper Extremity Assessment Upper Extremity Assessment: Overall WFL for tasks assessed    Lower Extremity Assessment Lower Extremity Assessment: Generalized weakness (pt reports BLE knee arthritis, reports hx of getting cortisone shot in R knee)       Communication   Communication Communication: No apparent difficulties    Cognition Arousal: Alert Behavior During Therapy: WFL for tasks assessed/performed   PT - Cognitive impairments: No apparent impairments                                 Cueing       General Comments General comments (skin integrity, edema, etc.): Max HR 130 bpm    Exercises Other Exercises Other Exercises: Pt performs 5x STS from slightly elevated EOB (pt tall) with supervision with focus on BLE strengthening & endurance training; pt with good anterior weight shift, requires extra time to power up to standing.   Assessment/Plan    PT Assessment Patient needs continued PT services  PT Problem List Decreased strength;Pain;Decreased activity tolerance;Decreased balance;Decreased mobility       PT Treatment Interventions DME instruction;Balance training;Modalities;Gait training;Neuromuscular re-education;Stair training;Functional mobility training;Therapeutic activities;Therapeutic exercise;Manual techniques;Patient/family education    PT Goals (Current goals can be found in the Care Plan section)  Acute Rehab PT Goals Patient Stated Goal: decreased pain, get better, go home PT Goal  Formulation: With patient Time For Goal Achievement: 11/15/23 Potential to Achieve Goals: Good    Frequency Min 1X/week     Co-evaluation               AM-PAC PT "6 Clicks" Mobility  Outcome Measure Help needed turning from your back to your side while in a flat bed without using bedrails?: None Help needed moving from lying on your back to sitting on the side of a flat bed without using bedrails?: None Help needed moving to and from a bed to a chair (including a wheelchair)?: A Little Help needed standing up from a chair using your arms (e.g., wheelchair or bedside chair)?: None Help needed to walk in hospital room?: A Little Help needed climbing 3-5 steps with a railing? : A Little 6 Click Score: 21    End of Session   Activity Tolerance: Patient tolerated treatment well Patient left: in bed;with call bell/phone within reach   PT Visit Diagnosis: Muscle weakness (generalized) (M62.81);Pain;Other abnormalities of gait and mobility (R26.89) Pain - Right/Left: Right Pain - part of body: Knee    Time: 8413-2440 PT Time Calculation (min) (ACUTE ONLY): 13 min   Charges:   PT Evaluation $PT Eval Moderate Complexity: 1 Mod   PT General Charges $$ ACUTE PT VISIT: 1 Visit  Aleda Grana, PT, DPT 11/01/23, 3:37 PM   Sandi Mariscal 11/01/2023, 3:35 PM

## 2023-11-01 NOTE — ED Notes (Signed)
 Patient is resting in bed with lights off for comfort.

## 2023-11-01 NOTE — Progress Notes (Signed)
Patient refused bed alarm.    

## 2023-11-01 NOTE — Progress Notes (Addendum)
 Acute on chronic anemia Patient hemoglobin dropped to 7.2 to 6.8 this morning.  Patient has history of anemia of chronic disease in the setting of CKD stage IIIa/IIIb.  Pending FOBT and anemia panel.  Patient denies any blood loss per stool. - Holding any pharmacological DVT prophylaxis - Discussed with patient patient declining blood transfusion due to religion cause.

## 2023-11-02 DIAGNOSIS — N1831 Chronic kidney disease, stage 3a: Secondary | ICD-10-CM

## 2023-11-02 DIAGNOSIS — N179 Acute kidney failure, unspecified: Secondary | ICD-10-CM

## 2023-11-02 DIAGNOSIS — D539 Nutritional anemia, unspecified: Secondary | ICD-10-CM

## 2023-11-02 DIAGNOSIS — E871 Hypo-osmolality and hyponatremia: Secondary | ICD-10-CM | POA: Diagnosis not present

## 2023-11-02 LAB — CBC
HCT: 18.5 % — ABNORMAL LOW (ref 36.0–46.0)
Hemoglobin: 6.2 g/dL — CL (ref 12.0–15.0)
MCH: 32.8 pg (ref 26.0–34.0)
MCHC: 33.5 g/dL (ref 30.0–36.0)
MCV: 97.9 fL (ref 80.0–100.0)
Platelets: 202 10*3/uL (ref 150–400)
RBC: 1.89 MIL/uL — ABNORMAL LOW (ref 3.87–5.11)
RDW: 14.7 % (ref 11.5–15.5)
WBC: 9.5 10*3/uL (ref 4.0–10.5)
nRBC: 0.2 % (ref 0.0–0.2)

## 2023-11-02 LAB — PHOSPHORUS: Phosphorus: 1.8 mg/dL — ABNORMAL LOW (ref 2.5–4.6)

## 2023-11-02 LAB — BASIC METABOLIC PANEL
Anion gap: 13 (ref 5–15)
BUN: 14 mg/dL (ref 8–23)
CO2: 19 mmol/L — ABNORMAL LOW (ref 22–32)
Calcium: 7.6 mg/dL — ABNORMAL LOW (ref 8.9–10.3)
Chloride: 100 mmol/L (ref 98–111)
Creatinine, Ser: 1.68 mg/dL — ABNORMAL HIGH (ref 0.44–1.00)
GFR, Estimated: 34 mL/min — ABNORMAL LOW (ref >60)
Glucose, Bld: 87 mg/dL (ref 70–99)
Potassium: 4.1 mmol/L (ref 3.5–5.1)
Sodium: 132 mmol/L — ABNORMAL LOW (ref 135–145)

## 2023-11-02 LAB — PARATHYROID HORMONE, INTACT (NO CA): PTH: 97 pg/mL — ABNORMAL HIGH (ref 15–65)

## 2023-11-02 LAB — MAGNESIUM: Magnesium: 1.4 mg/dL — ABNORMAL LOW (ref 1.7–2.4)

## 2023-11-02 MED ORDER — GABAPENTIN 100 MG PO CAPS: 100.0000 mg | ORAL_CAPSULE | Freq: Three times a day (TID) | ORAL | 2 refills | Status: DC

## 2023-11-02 MED ORDER — SODIUM BICARBONATE 650 MG PO TABS: 650.0000 mg | ORAL_TABLET | Freq: Three times a day (TID) | ORAL | 2 refills | Status: DC

## 2023-11-02 MED ORDER — FOLIC ACID 1 MG PO TABS: 1.0000 mg | ORAL_TABLET | Freq: Every day | ORAL | 1 refills | Status: DC

## 2023-11-02 MED ORDER — VITAMIN B-1 100 MG PO TABS: 100.0000 mg | ORAL_TABLET | Freq: Every day | ORAL | 2 refills | Status: DC

## 2023-11-02 MED ORDER — ENSURE ENLIVE PO LIQD: 237.0000 mL | Freq: Two times a day (BID) | ORAL | 12 refills | Status: DC

## 2023-11-02 MED ORDER — FERROUS SULFATE 325 (65 FE) MG PO TABS
325.0000 mg | ORAL_TABLET | Freq: Two times a day (BID) | ORAL | Status: DC
  Administered 2023-11-02 (×2): 325 mg via ORAL
  Filled 2023-11-02 (×2): qty 1

## 2023-11-02 MED ORDER — DARBEPOETIN ALFA 100 MCG/0.5ML IJ SOSY: 100.0000 ug | PREFILLED_SYRINGE | INTRAMUSCULAR | 2 refills | Status: DC

## 2023-11-02 MED ORDER — DARBEPOETIN ALFA 100 MCG/0.5ML IJ SOSY: 100.0000 ug | PREFILLED_SYRINGE | INTRAMUSCULAR | Status: DC

## 2023-11-02 MED ORDER — VITAMIN B-12 1000 MCG PO TABS: 1000.0000 ug | ORAL_TABLET | Freq: Every day | ORAL | 2 refills | Status: AC

## 2023-11-02 MED ORDER — SODIUM PHOSPHATES 45 MMOLE/15ML IV SOLN
30.0000 mmol | Freq: Once | INTRAVENOUS | Status: AC
  Administered 2023-11-02: 30 mmol via INTRAVENOUS
  Filled 2023-11-02: qty 10

## 2023-11-02 MED ORDER — CYCLOBENZAPRINE HCL 5 MG PO TABS: 5.0000 mg | ORAL_TABLET | Freq: Three times a day (TID) | ORAL | 0 refills | Status: DC | PRN

## 2023-11-02 MED ORDER — NICOTINE 7 MG/24HR TD PT24: 7.0000 mg | MEDICATED_PATCH | Freq: Every day | TRANSDERMAL | 0 refills | Status: DC

## 2023-11-02 MED ORDER — MAGNESIUM SULFATE 4 GM/100ML IV SOLN
4.0000 g | Freq: Once | INTRAVENOUS | Status: AC
  Administered 2023-11-02: 4 g via INTRAVENOUS
  Filled 2023-11-02: qty 100

## 2023-11-02 MED ORDER — VITAMIN D (ERGOCALCIFEROL) 1.25 MG (50000 UNIT) PO CAPS: 50000.0000 [IU] | ORAL_CAPSULE | ORAL | 0 refills | Status: DC

## 2023-11-02 MED ORDER — DARBEPOETIN ALFA 100 MCG/0.5ML IJ SOSY
100.0000 ug | PREFILLED_SYRINGE | Freq: Once | INTRAMUSCULAR | Status: AC
Start: 2023-11-02 — End: 2023-11-02
  Administered 2023-11-02: 100 ug via SUBCUTANEOUS
  Filled 2023-11-02: qty 0.5

## 2023-11-02 MED ORDER — FERROUS SULFATE 325 (65 FE) MG PO TABS: 325.0000 mg | ORAL_TABLET | Freq: Two times a day (BID) | ORAL | 3 refills | Status: DC

## 2023-11-02 MED ORDER — ATORVASTATIN CALCIUM 20 MG PO TABS: 20.0000 mg | ORAL_TABLET | Freq: Every day | ORAL | 1 refills | Status: DC

## 2023-11-02 MED ORDER — ASPIRIN 81 MG PO TBEC: 81.0000 mg | DELAYED_RELEASE_TABLET | Freq: Every day | ORAL | 12 refills | Status: AC

## 2023-11-02 MED ORDER — ACETAMINOPHEN 325 MG PO TABS: 650.0000 mg | ORAL_TABLET | Freq: Four times a day (QID) | ORAL | 1 refills | Status: DC | PRN

## 2023-11-02 MED ORDER — OYSTER SHELL CALCIUM/D3 500-5 MG-MCG PO TABS: 1.0000 | ORAL_TABLET | Freq: Two times a day (BID) | ORAL | 2 refills | Status: DC

## 2023-11-02 MED ORDER — TRAMADOL HCL 50 MG PO TABS
50.0000 mg | ORAL_TABLET | Freq: Four times a day (QID) | ORAL | 0 refills | Status: DC | PRN
Start: 2023-11-02 — End: 2024-03-14

## 2023-11-02 MED ORDER — SODIUM BICARBONATE 650 MG PO TABS
650.0000 mg | ORAL_TABLET | Freq: Three times a day (TID) | ORAL | Status: DC
  Administered 2023-11-02 (×2): 650 mg via ORAL
  Filled 2023-11-02 (×2): qty 1

## 2023-11-02 NOTE — Discharge Summary (Signed)
 Physician Discharge Summary   Patient: Kelsey Poole MRN: [id number] DOB: 09/09/1960  Admit date:     10/31/2023  Discharge date: 11/02/23  Discharge Physician: Arnetha Courser   PCP: Pcp, No   Recommendations at discharge:  Please obtain CBC and renal function follow-up Patient is being discharged on Aranesp, please monitor hemoglobin to keep it between 10-11. Patient has refused blood transfusion due to religious reason. Patient is also being discharged on multiple supplements for significant different deficiencies. Follow-up with primary care provider-please consider arranging referral to neurosurgery for cervical radiculopathy Follow-up with nephrology Patient need persistent of counseling for alcohol and smoking cessation.  Discharge Diagnoses: Principal Problem:   Acute kidney injury superimposed on stage 3a chronic kidney disease (HCC) Active Problems:   Hypomagnesemia   Acute hyponatremia   Anemia of chronic disease   Stage 3b chronic kidney disease (CKD) (HCC)   Hypokalemia   Macrocytic anemia   Chronic alcohol use   Reactive airway disease   Acute on chronic anemia   Sore throat   Continuous dependence on cigarette smoking   Cervical radiculopathy   Hypocalcemia   DDD (degenerative disc disease), cervical   Vitamin D deficiency  Resolved Problems:   * No resolved hospital problems. *  Hospital Course: Kelsey Poole is a 63 y.o. female with medical history significant of ckd stage IIIb, chronic alcohol use, macrocytic anemia, chronic hyponatremia, chronic hypomagnesemia, chronic hypokalemia, chronic smoking of cigarette, reactive airway disease, carpal tunnel syndrome essential hypertension presented to emergency department with complaining of left-sided ear pain, sore throat for past 4 days, patient reported that he has been experiencing shooting pain to her spine radiate to the left-sided chest and left-sided arm. Patient noted chest pain has been improved but  is still having some pain in her neck.  Denies any fever, chill, neck stiffness, blurry vision, weakness and headache.   On admission patient has AKI with CKD stage IIIb along with multiple other electrolyte and nutritional deficiencies which were corrected.  Renal ultrasound was unremarkable.  Patient also had significant anemia but declined blood transfusion due to religious reason.  Anemia panel consistent with borderline iron, folic acid and B12 along with anemia of chronic disease.  She was started on multiple supplements and Aranesp.  His PCP and nephrologist can monitor to keep the hemoglobin between 10-11.  She also received IV calcium, magnesium, phosphorus supplements.  She was started on multiple supplements to help.  She was given nicotine patch to help with quit smoking.  She was also counseled for quitting smoking and alcohol abuse.  She will need continuation of counseling.  We offered home health PT which she declined.  She was found to have barely positive troponin and mild sinus tachycardia which was likely due to anemia on admission.  Case was discussed with cardiology.  Echocardiogram was normal.  Patient likely have some reactive airway disease but no formal diagnosis of COPD.  Patient can benefit from seeing a pulmonologist for formal evaluation.  She complained of left-sided neck pain radiating to the left shoulder and arm,MRI cervical spine showed multi factorial degenerative change resulting foraminal narrowing C3-C4 and C6-6 7.  She was started on gabapentin and Flexeril.  She was also given some tramadol if Tylenol does not work.  Her PCP should be able to arrange outpatient neurosurgery evaluation.  She will continue on current medications and need to have a close follow-up with her providers for further management.   Pain control - [facility]  Controlled Substance Reporting System database was reviewed. and patient was instructed, not to drive, operate heavy  machinery, perform activities at heights, swimming or participation in water activities or provide baby-sitting services while on Pain, Sleep and Anxiety Medications; until their outpatient Physician has advised to do so again. Also recommended to not to take more than prescribed Pain, Sleep and Anxiety Medications.  Consultants: None Procedures performed: None Disposition: Home Diet recommendation:  Discharge Diet Orders (From admission, onward)     Start     Ordered   11/02/23 0000  Diet - low sodium heart healthy        11/02/23 1011           Renal diet DISCHARGE MEDICATION: Allergies as of 11/02/2023       Reactions   Percocet [oxycodone-acetaminophen] Itching   Cephalexin Itching, Rash   "Scratched self raw"   Z-pak [azithromycin] Hives        Medication List     TAKE these medications    acetaminophen 325 MG tablet Commonly known as: TYLENOL Take 2 tablets (650 mg total) by mouth every 6 (six) hours as needed for mild pain (pain score 1-3), moderate pain (pain score 4-6), fever or headache.   aspirin EC 81 MG tablet Take 1 tablet (81 mg total) by mouth daily. Swallow whole. Start taking on: November 03, 2023   atorvastatin 20 MG tablet Commonly known as: LIPITOR Take 1 tablet (20 mg total) by mouth daily. Start taking on: November 03, 2023   cyanocobalamin 1000 MCG tablet Commonly known as: VITAMIN B12 Take 1 tablet (1,000 mcg total) by mouth daily.   cyclobenzaprine 5 MG tablet Commonly known as: FLEXERIL Take 1 tablet (5 mg total) by mouth 3 (three) times daily as needed for muscle spasms.   Darbepoetin Alfa 100 MCG/0.5ML Sosy injection Commonly known as: ARANESP Inject 0.5 mLs (100 mcg total) into the skin every Friday at 6 PM. Start taking on: November 09, 2023   feeding supplement Liqd Take 237 mLs by mouth 2 (two) times daily between meals.   ferrous sulfate 325 (65 FE) MG tablet Take 1 tablet (325 mg total) by mouth 2 (two) times daily with a meal.    folic acid 1 MG tablet Commonly known as: FOLVITE Take 1 tablet (1 mg total) by mouth daily. Start taking on: November 03, 2023   gabapentin 100 MG capsule Commonly known as: NEURONTIN Take 1 capsule (100 mg total) by mouth 3 (three) times daily.   Melatonin 12 MG Tabs Take 12 mg by mouth at bedtime.   nicotine 7 mg/24hr patch Commonly known as: NICODERM CQ - dosed in mg/24 hr Place 1 patch (7 mg total) onto the skin daily. Start taking on: November 03, 2023   sodium bicarbonate 650 MG tablet Take 1 tablet (650 mg total) by mouth 3 (three) times daily.   thiamine 100 MG tablet Commonly known as: Vitamin B-1 Take 1 tablet (100 mg total) by mouth daily. Start taking on: November 03, 2023   traMADol 50 MG tablet Commonly known as: ULTRAM Take 1 tablet (50 mg total) by mouth every 6 (six) hours as needed for severe pain (pain score 7-10).   Vitamin D (Ergocalciferol) 1.25 MG (50000 UNIT) Caps capsule Commonly known as: DRISDOL Take 1 capsule (50,000 Units total) by mouth every 7 (seven) days. Start taking on: November 08, 2023        Discharge Exam:  General.  Frail lady, in no acute distress. Pulmonary.  Lungs clear bilaterally, normal respiratory effort. CV.  Regular rate and rhythm, no JVD, rub or murmur. Abdomen.  Soft, nontender, nondistended, BS positive. CNS.  Alert and oriented .  No focal neurologic deficit. Extremities.  No edema, no cyanosis, pulses intact and symmetrical. Psychiatry.  Judgment and insight appears normal.   Condition at discharge: stable  The results of significant diagnostics from this hospitalization (including imaging, microbiology, ancillary and laboratory) are listed below for reference.   Imaging Studies: ECHOCARDIOGRAM COMPLETE Result Date: 11/01/2023    ECHOCARDIOGRAM REPORT   Patient Name:   Kelsey Poole Date of Exam: 11/01/2023 Medical Rec #:  [id number]         Height:       71.0 in Accession #:    [id number]        Weight:       148.6 lb  Date of Birth:  08-29-1961         BSA:          1.858 m Patient Age:    62 years          BP:           142/91 mmHg Patient Gender: F                 HR:           100 bpm. Exam Location:  Inpatient Procedure: 2D Echo, Cardiac Doppler, Color Doppler and Intracardiac            Opacification Agent (Both Spectral and Color Flow Doppler were            utilized during procedure). Indications:    Elevated Troponin  History:        Patient has no prior history of Echocardiogram examinations.                 Risk Factors:Hypertension and Current Smoker.  Sonographer:    Karma Ganja Referring Phys: Tereasa Coop  Sonographer Comments: Technically challenging study due to limited acoustic windows. Image acquisition challenging due to patient body habitus. IMPRESSIONS  1. Left ventricular ejection fraction, by estimation, is 70 to 75%. The left ventricle has hyperdynamic function. The left ventricle has no regional wall motion abnormalities. There is mild concentric left ventricular hypertrophy. Left ventricular diastolic parameters were normal.  2. Right ventricular systolic function is normal. The right ventricular size is normal.  3. The mitral valve is normal in structure. No evidence of mitral valve regurgitation. No evidence of mitral stenosis.  4. The aortic valve is tricuspid. Aortic valve regurgitation is not visualized. No aortic stenosis is present.  5. The inferior vena cava is normal in size with greater than 50% respiratory variability, suggesting right atrial pressure of 3 mmHg. Comparison(s): No prior Echocardiogram. FINDINGS  Left Ventricle: Left ventricular ejection fraction, by estimation, is 70 to 75%. The left ventricle has hyperdynamic function. The left ventricle has no regional wall motion abnormalities. Definity contrast agent was given IV to delineate the left ventricular endocardial borders. Strain imaging was not performed. The left ventricular internal cavity size was normal in size. There is  mild concentric left ventricular hypertrophy. Left ventricular diastolic parameters were normal. Right Ventricle: The right ventricular size is normal. No increase in right ventricular wall thickness. Right ventricular systolic function is normal. Left Atrium: Left atrial size was normal in size. Right Atrium: Right atrial size was normal in size. Pericardium: There is no evidence of pericardial effusion. Mitral Valve: The mitral valve  is normal in structure. No evidence of mitral valve regurgitation. No evidence of mitral valve stenosis. MV peak gradient, 3.9 mmHg. The mean mitral valve gradient is 2.0 mmHg. Tricuspid Valve: The tricuspid valve is normal in structure. Tricuspid valve regurgitation is not demonstrated. No evidence of tricuspid stenosis. Aortic Valve: The aortic valve is tricuspid. Aortic valve regurgitation is not visualized. No aortic stenosis is present. Aortic valve mean gradient measures 5.0 mmHg. Aortic valve peak gradient measures 8.6 mmHg. Aortic valve area, by VTI measures 2.18 cm. Pulmonic Valve: The pulmonic valve was not well visualized. Pulmonic valve regurgitation is not visualized. No evidence of pulmonic stenosis. Aorta: The aortic root, ascending aorta and aortic arch are all structurally normal, with no evidence of dilitation or obstruction. Venous: The inferior vena cava is normal in size with greater than 50% respiratory variability, suggesting right atrial pressure of 3 mmHg. IAS/Shunts: The atrial septum is grossly normal. Additional Comments: 3D imaging was not performed.  LEFT VENTRICLE PLAX 2D LVIDd:         4.40 cm   Diastology LVIDs:         2.50 cm   LV e' medial:    9.46 cm/s LV PW:         1.10 cm   LV E/e' medial:  8.4 LV IVS:        1.10 cm   LV e' lateral:   14.70 cm/s LVOT diam:     1.90 cm   LV E/e' lateral: 5.4 LV SV:         53 LV SV Index:   28 LVOT Area:     2.84 cm  RIGHT VENTRICLE             IVC RV Basal diam:  3.50 cm     IVC diam: 1.20 cm RV S prime:      13.20 cm/s TAPSE (M-mode): 2.0 cm LEFT ATRIUM             Index        RIGHT ATRIUM           Index LA diam:        3.70 cm 1.99 cm/m   RA Area:     15.50 cm LA Vol (A2C):   64.6 ml 34.76 ml/m  RA Volume:   46.80 ml  25.18 ml/m LA Vol (A4C):   41.6 ml 22.38 ml/m LA Biplane Vol: 54.5 ml 29.33 ml/m  AORTIC VALVE AV Area (Vmax):    2.24 cm AV Area (Vmean):   2.03 cm AV Area (VTI):     2.18 cm AV Vmax:           147.00 cm/s AV Vmean:          99.800 cm/s AV VTI:            0.242 m AV Peak Grad:      8.6 mmHg AV Mean Grad:      5.0 mmHg LVOT Vmax:         116.00 cm/s LVOT Vmean:        71.300 cm/s LVOT VTI:          0.186 m LVOT/AV VTI ratio: 0.77  AORTA Ao Root diam: 2.60 cm MITRAL VALVE MV Area (PHT): 4.63 cm    SHUNTS MV Area VTI:   3.13 cm    Systemic VTI:  0.19 m MV Peak grad:  3.9 mmHg    Systemic Diam: 1.90 cm MV Mean grad:  2.0  mmHg MV Vmax:       0.98 m/s MV Vmean:      66.8 cm/s MV Decel Time: 164 msec MV E velocity: 79.70 cm/s MV A velocity: 88.60 cm/s MV E/A ratio:  0.90 Riley Lam MD Electronically signed by Riley Lam MD Signature Date/Time: 11/01/2023/11:51:24 AM    Final    MR CERVICAL SPINE WO CONTRAST Result Date: 11/01/2023 CLINICAL DATA:  Initial evaluation for chronic neck pain. EXAM: MRI CERVICAL SPINE WITHOUT CONTRAST TECHNIQUE: Multiplanar, multisequence MR imaging of the cervical spine was performed. No intravenous contrast was administered. COMPARISON:  None Available. FINDINGS: Alignment: Straightening of the normal cervical lordosis. Underlying mild levoscoliosis. No significant listhesis. Vertebrae: Vertebral body height maintained without acute or chronic fracture. Bone marrow signal intensity diffusely heterogeneous but overall within normal limits. No worrisome osseous lesions. No abnormal marrow edema. Cord: Normal signal and morphology. Posterior Fossa, vertebral arteries, paraspinal tissues: Unremarkable. Disc levels: C2-C3: Mild disc bulge with  uncovertebral spurring. Mild left-sided facet hypertrophy. No significant spinal stenosis. Foramina remain patent. C3-C4: Degenerative intervertebral disc space narrowing with diffuse disc osteophyte complex. Flattening and partial effacement of the ventral thecal sac with mild cord flattening, but no cord signal changes. Superimposed mild facet hypertrophy. Resultant moderate spinal stenosis. Mild to moderate left with moderate to severe right C4 foraminal stenosis. C4-C5: Degenerative vertebral disc space narrowing with diffuse disc osteophyte complex. Flattening and partial effacement of the ventral thecal sac. Superimposed mild right-sided facet hypertrophy. Resultant moderate spinal stenosis. Moderate to severe right with moderate left C5 foraminal stenosis. C5-C6: Degenerative vertebral disc space narrowing with diffuse disc osteophyte complex. Broad posterior component flattens and partially effaces the ventral thecal sac. Superimposed small central soft disc protrusion (series 9, image 27). Mild to moderate spinal stenosis. Mild right C6 foraminal narrowing. Left neural foramina remains patent. C6-C7: Diffuse disc bulge with bilateral uncovertebral spurring. Flattening and partial effacement of the ventral thecal sac. Superimposed small central soft disc protrusion (series 9, image 31). Mild cord flattening without cord signal changes, worse on the left. Mild to moderate spinal stenosis. Foramina remain patent. C7-T1: Right-sided uncovertebral spurring without significant disc bulge. Mild right-sided facet hypertrophy. No significant spinal stenosis. Mild right C8 foraminal narrowing. Left neural foramina remains patent. IMPRESSION: 1. No acute abnormality within the cervical spine. 2. Moderate cervical spondylosis with resultant mild to moderate diffuse spinal stenosis at C3-4 through C6-7. 3. Multifactorial degenerative changes with resultant multilevel foraminal narrowing as above. Notable findings  include moderate to severe right C4 and C5 foraminal stenosis, with mild to moderate left C4 and C5 foraminal narrowing. Electronically Signed   By: Rise Mu M.D.   On: 11/01/2023 00:18   US RENAL Result Date: 10/31/2023 CLINICAL DATA:  Acute kidney injury EXAM: RENAL / URINARY TRACT ULTRASOUND COMPLETE COMPARISON:  Renal ultrasound 10/21/2022 FINDINGS: Right Kidney: Renal measurements: 9.9 x 5.1 x 4.4 cm = volume: 117 mL. Echogenicity is increased. No mass or hydronephrosis visualized. Left Kidney: Renal measurements: 10.3 x 4.9 x 4.0 cm = volume: 107 mL. Echogenicity is increased. No mass or hydronephrosis visualized. Bladder: Appears normal for degree of bladder distention. Other: None. IMPRESSION: 1. Increased echogenicity of the kidneys as can be seen in medical renal disease. 2. No hydronephrosis. Electronically Signed   By: Darliss Cheney M.D.   On: 10/31/2023 21:46   CT Angio Chest PE W and/or Wo Contrast Result Date: 10/31/2023 CLINICAL DATA:  Positive D-dimer, left-sided chest pain radiating to back and arm EXAM: CT ANGIOGRAPHY  CHEST WITH CONTRAST TECHNIQUE: Multidetector CT imaging of the chest was performed using the standard protocol during bolus administration of intravenous contrast. Multiplanar CT image reconstructions and MIPs were obtained to evaluate the vascular anatomy. RADIATION DOSE REDUCTION: This exam was performed according to the departmental dose-optimization program which includes automated exposure control, adjustment of the mA and/or kV according to patient size and/or use of iterative reconstruction technique. CONTRAST:  55mL OMNIPAQUE IOHEXOL 350 MG/ML SOLN COMPARISON:  04/09/2023 FINDINGS: Cardiovascular: This is a technically adequate evaluation of the pulmonary vasculature. No filling defects or pulmonary emboli. The heart is unremarkable without pericardial effusion. No evidence of thoracic aortic aneurysm or dissection. Atherosclerosis of the aorta and coronary  vasculature. Mediastinum/Nodes: No enlarged mediastinal, hilar, or axillary lymph nodes. Thyroid gland, trachea, and esophagus demonstrate no significant findings. Lungs/Pleura: Mild emphysema. No acute airspace disease, effusion, or pneumothorax. Minimal subsegmental atelectasis or scarring within the dependent lower lobes. Central airways are patent. Upper Abdomen: Hepatic steatosis. No acute upper abdominal findings. Musculoskeletal: No acute or destructive bony abnormalities. Reconstructed images demonstrate no additional findings. Review of the MIP images confirms the above findings. IMPRESSION: 1. No evidence of pulmonary embolus. 2. No acute intrathoracic process. 3. Aortic Atherosclerosis (ICD10-I70.0) and Emphysema (ICD10-J43.9). 4. Coronary artery atherosclerosis. Electronically Signed   By: Sharlet Salina M.D.   On: 10/31/2023 19:56   DG Chest 2 View Result Date: 10/31/2023 CLINICAL DATA:  Chest pain. EXAM: CHEST - 2 VIEW COMPARISON:  October 20, 2022. FINDINGS: The heart size and mediastinal contours are within normal limits. Both lungs are clear. The visualized skeletal structures are unremarkable. IMPRESSION: No active cardiopulmonary disease. Electronically Signed   By: Lupita Raider M.D.   On: 10/31/2023 16:06    Microbiology: Results for orders placed or performed during the hospital encounter of 10/19/22  Urine Culture (for pregnant, neutropenic or urologic patients or patients with an indwelling urinary catheter)     Status: Abnormal   Collection Time: 10/20/22  1:42 PM   Specimen: Urine, Clean Catch  Result Value Ref Range Status   Specimen Description URINE, CLEAN CATCH  Final   Special Requests NONE  Final   Culture (A)  Final    <10,000 COLONIES/mL INSIGNIFICANT GROWTH Performed at [facility], [address] [place], [place] [address]    Report Status 10/21/2022 FINAL  Final    Labs: CBC: Recent Labs  Lab 10/31/23 1430 11/01/23 0448 11/02/23 0616  WBC  12.5* 10.7* 9.5  HGB 7.2* 6.8* 6.2*  HCT 21.6* 20.7* 18.5*  MCV 97.3 98.6 97.9  PLT 230 212 202   Basic Metabolic Panel: Recent Labs  Lab 10/31/23 1430 10/31/23 1616 11/01/23 0448 11/02/23 0616  NA 133*  --  134* 132*  K 3.3*  --  3.3* 4.1  CL 99  --  100 100  CO2 19*  --  19* 19*  GLUCOSE 105*  --  86 87  BUN 14  --  13 14  CREATININE 1.80*  --  1.65* 1.68*  CALCIUM 6.1*  --  7.2* 7.6*  MG  --  <0.5* 1.9 1.4*  PHOS  --   --   --  1.8*   Liver Function Tests: Recent Labs  Lab 11/01/23 0448  AST 22  ALT 9  ALKPHOS 93  BILITOT 0.7  PROT 5.5*  ALBUMIN 2.4*   CBG: No results for input(s): "GLUCAP" in the last 168 hours.  Discharge time spent: greater than 30 minutes.  This record  has been created using Conservation officer, historic buildings. Errors have been sought and corrected,but may not always be located. Such creation errors do not reflect on the standard of care.   Signed: Arnetha Courser, MD [facility] 11/02/2023

## 2023-11-02 NOTE — TOC Transition Note (Addendum)
 Transition of Care [facility]) - Discharge Note   Patient Details  Name: Kelsey Poole MRN: [id number] Date of Birth: 09-Mar-1961  [facility] CM/SW Contact:  Tom-Johnson, Hershal Coria, RN Phone Number: 11/02/2023, 10:52 AM   Clinical Narrative:     Patient is scheduled for discharge today.  Readmission Risk Assessment done. PCP updated on Epic. Outpatient f/u, hospital f/u and discharge instructions on AVS. No PT/OT f/u and no TOC needs or recommendations noted. Friend/Neighbor, Ramonia to transport at discharge.  No further TOC needs noted.         Final next level of care: Home/Self Care Barriers to Discharge: Barriers Resolved   Patient Goals and CMS Choice Patient states their goals for this hospitalization and ongoing recovery are:: To return home CMS Medicare.gov Compare Post Acute Care list provided to:: Patient Choice offered to / list presented to : Patient      Discharge Placement                Patient to be transferred to facility by: Friend/Neighbor Name of family member notified: [place]    Discharge Plan and Services Additional resources added to the After Visit Summary for                  DME Arranged: N/A DME Agency: NA         HH Agency: NA        Social Drivers of Health (SDOH) Interventions SDOH Screenings   Food Insecurity: No Food Insecurity (10/31/2023)  Housing: Low Risk  (10/31/2023)  Transportation Needs: Unmet Transportation Needs (10/31/2023)  Utilities: Not At Risk (10/31/2023)  Depression (PHQ2-9): High Risk (09/20/2022)  Tobacco Use: High Risk (10/31/2023)     Readmission Risk Interventions    11/02/2023   10:51 AM 10/22/2022    9:32 AM  Readmission Risk Prevention Plan  Transportation Screening Complete Complete  PCP or Specialist Appt within 5-7 Days Complete   PCP or Specialist Appt within 3-5 Days  --  Home Care Screening Complete   Medication Review (RN CM) Referral to Pharmacy   HRI or  Home Care Consult  Complete  Social Work Consult for Recovery Care Planning/Counseling  Complete  Palliative Care Screening  Not Applicable  Medication Review Oceanographer)  Complete

## 2023-11-02 NOTE — Plan of Care (Signed)
 Her echocardiogram was normal. She has non cardiac pain. We can arrange for FU. Otherwise cardiology will sign off.

## 2023-11-02 NOTE — Care Management Important Message (Signed)
 Important Message  Patient Details  Name: Kelsey Poole MRN: [id number] Date of Birth: 21-Apr-1961   Important Message Given:  Yes - Medicare IM     Dorena Bodo 11/02/2023, 3:39 PM

## 2023-11-02 NOTE — Plan of Care (Signed)

## 2023-11-03 LAB — LIPOPROTEIN A (LPA): Lipoprotein (a): 21.3 nmol/L (ref <75.0)

## 2023-11-05 ENCOUNTER — Telehealth: Payer: Self-pay

## 2023-11-05 NOTE — Transitions of Care (Post Inpatient/ED Visit) (Signed)
   11/05/2023  Name: Kelsey Poole MRN: [id number] DOB: 1961/03/27  Today's TOC FU Call Status: Today's TOC FU Call Status:: Unsuccessful Call (1st Attempt) Unsuccessful Call (1st Attempt) Date: 11/05/23  Attempted to reach the patient regarding the most recent Inpatient/ED visit. Patient reported she is at dentist office.  Follow Up Plan: Additional outreach attempts will be made to reach the patient to complete the Transitions of Care (Post Inpatient/ED visit) call.   Hilbert Odor RN, CCM [place]  VBCI-Population Health RN Care Manager [phone]

## 2023-11-05 NOTE — Telephone Encounter (Signed)
 Spoke with patient and confirmed visit on 11/06/23

## 2023-11-06 ENCOUNTER — Other Ambulatory Visit: Payer: Self-pay | Admitting: *Deleted

## 2023-11-06 ENCOUNTER — Telehealth: Payer: Self-pay | Admitting: *Deleted

## 2023-11-06 ENCOUNTER — Inpatient Hospital Stay (HOSPITAL_BASED_OUTPATIENT_CLINIC_OR_DEPARTMENT_OTHER): Admitting: Hematology and Oncology

## 2023-11-06 ENCOUNTER — Inpatient Hospital Stay: Attending: Hematology and Oncology

## 2023-11-06 VITALS — BP 121/81 | HR 96 | Temp 98.7°F | Resp 18 | Ht 71.0 in | Wt 155.5 lb

## 2023-11-06 DIAGNOSIS — G629 Polyneuropathy, unspecified: Secondary | ICD-10-CM | POA: Diagnosis not present

## 2023-11-06 DIAGNOSIS — R309 Painful micturition, unspecified: Secondary | ICD-10-CM

## 2023-11-06 DIAGNOSIS — D631 Anemia in chronic kidney disease: Secondary | ICD-10-CM

## 2023-11-06 DIAGNOSIS — N189 Chronic kidney disease, unspecified: Secondary | ICD-10-CM | POA: Insufficient documentation

## 2023-11-06 DIAGNOSIS — I129 Hypertensive chronic kidney disease with stage 1 through stage 4 chronic kidney disease, or unspecified chronic kidney disease: Secondary | ICD-10-CM

## 2023-11-06 DIAGNOSIS — J449 Chronic obstructive pulmonary disease, unspecified: Secondary | ICD-10-CM

## 2023-11-06 DIAGNOSIS — F17210 Nicotine dependence, cigarettes, uncomplicated: Secondary | ICD-10-CM | POA: Insufficient documentation

## 2023-11-06 DIAGNOSIS — D638 Anemia in other chronic diseases classified elsewhere: Secondary | ICD-10-CM

## 2023-11-06 LAB — URINALYSIS, COMPLETE (UACMP) WITH MICROSCOPIC
Bilirubin Urine: NEGATIVE
Glucose, UA: NEGATIVE mg/dL
Hgb urine dipstick: NEGATIVE
Ketones, ur: NEGATIVE mg/dL
Nitrite: NEGATIVE
Protein, ur: 30 mg/dL — AB
Specific Gravity, Urine: 1.011 (ref 1.005–1.030)
WBC, UA: >50 WBC/hpf (ref 0–5)
pH: 5 (ref 5.0–8.0)

## 2023-11-06 LAB — CBC WITH DIFFERENTIAL/PLATELET
Abs Immature Granulocytes: 0.01 10*3/uL (ref 0.00–0.07)
Basophils Absolute: 0 10*3/uL (ref 0.0–0.1)
Basophils Relative: 0 %
Eosinophils Absolute: 0.1 10*3/uL (ref 0.0–0.5)
Eosinophils Relative: 1 %
HCT: 20.8 % — ABNORMAL LOW (ref 36.0–46.0)
Hemoglobin: 6.6 g/dL — CL (ref 12.0–15.0)
Immature Granulocytes: 0 %
Lymphocytes Relative: 14 %
Lymphs Abs: 0.7 10*3/uL (ref 0.7–4.0)
MCH: 31.7 pg (ref 26.0–34.0)
MCHC: 31.7 g/dL (ref 30.0–36.0)
MCV: 100 fL (ref 80.0–100.0)
Monocytes Absolute: 0.3 10*3/uL (ref 0.1–1.0)
Monocytes Relative: 6 %
Neutro Abs: 4 10*3/uL (ref 1.7–7.7)
Neutrophils Relative %: 79 %
Platelets: 283 10*3/uL (ref 150–400)
RBC: 2.08 MIL/uL — ABNORMAL LOW (ref 3.87–5.11)
RDW: 15.1 % (ref 11.5–15.5)
WBC: 5.2 10*3/uL (ref 4.0–10.5)
nRBC: 0 % (ref 0.0–0.2)

## 2023-11-06 NOTE — Transitions of Care (Post Inpatient/ED Visit) (Signed)
 11/06/2023  Name: Kelsey Poole MRN: [id number] DOB: Nov 12, 1960  Today's TOC FU Call Status: Today's TOC FU Call Status:: Successful TOC FU Call Completed TOC FU Call Complete Date: 11/06/23 Patient's Name and Date of Birth confirmed.  Transition Care Management Follow-up Telephone Call Date of Discharge: 11/02/23 Discharge Facility: [facility]) Type of Discharge: Inpatient Admission Primary Inpatient Discharge Diagnosis:: AKI in setting of CKD- III/ Chronic ETOH use disorder How have you been since you were released from the hospital?: Better ("I am okay.  I just left the new hematology doctor's office.  I am on the bus.  I now go to a PCP at [facility], I think my visit with her is on 11/13/23") Any questions or concerns?: No  Items Reviewed: Did you receive and understand the discharge instructions provided?: Yes (briefly reviewed with patient who verbalizes good understanding of same - she is on the bus for public transportation and is is very loud) Medications obtained,verified, and reconciled?: No Medications Not Reviewed Reasons:: Other: (on public transportation with extremely loud conditions, and she is not near her medications to review: confirmed non-[facility] PCP; reports self-manages medications and denies questions/ concerns around medications today) Any new allergies since your discharge?: No Dietary orders reviewed?: No Do you have support at home?:  (unable to assess)  Medications Reviewed Today: Medications Reviewed Today     Reviewed by Michaela Corner, RN (Registered Nurse) on 11/06/23 at 1628  Med List Status: <None>   Medication Order Taking? Sig Documenting Provider Last Dose Status Informant  acetaminophen (TYLENOL) 325 MG tablet [id number]  Take 2 tablets (650 mg total) by mouth every 6 (six) hours as needed for mild pain (pain score 1-3), moderate pain (pain score 4-6), fever or headache. Arnetha Courser, MD  Active   aspirin EC 81 MG tablet [id number]  Take  1 tablet (81 mg total) by mouth daily. Swallow whole. Arnetha Courser, MD  Active   atorvastatin (LIPITOR) 20 MG tablet [id number]  Take 1 tablet (20 mg total) by mouth daily. Arnetha Courser, MD  Active   calcium-vitamin D Ruthell Rummage WITH D) 500-5 MG-MCG tablet [id number]  Take 1 tablet by mouth 2 (two) times daily. Arnetha Courser, MD  Active   cyanocobalamin (VITAMIN B12) 1000 MCG tablet [id number]  Take 1 tablet (1,000 mcg total) by mouth daily. Arnetha Courser, MD  Active   cyclobenzaprine (FLEXERIL) 5 MG tablet [id number]  Take 1 tablet (5 mg total) by mouth 3 (three) times daily as needed for muscle spasms. Arnetha Courser, MD  Active   Darbepoetin Alfa (ARANESP) 100 MCG/0.5ML SOSY injection [id number]  Inject 0.5 mLs (100 mcg total) into the skin every Friday at 6 PM. Arnetha Courser, MD  Active   feeding supplement (ENSURE ENLIVE / ENSURE PLUS) LIQD [id number]  Take 237 mLs by mouth 2 (two) times daily between meals. Arnetha Courser, MD  Active   ferrous sulfate 325 (65 FE) MG tablet [id number]  Take 1 tablet (325 mg total) by mouth 2 (two) times daily with a meal. Arnetha Courser, MD  Active   folic acid (FOLVITE) 1 MG tablet [id number]  Take 1 tablet (1 mg total) by mouth daily. Arnetha Courser, MD  Active   gabapentin (NEURONTIN) 100 MG capsule [id number]  Take 1 capsule (100 mg total) by mouth 3 (three) times daily. Arnetha Courser, MD  Active   Melatonin 12 MG TABS [id number] No Take 12 mg by mouth at bedtime. [provider] 10/30/2023 Bedtime Active Self  nicotine (  NICODERM CQ - DOSED IN MG/24 HR) 7 mg/24hr patch [id number]  Place 1 patch (7 mg total) onto the skin daily. Arnetha Courser, MD  Active   sodium bicarbonate 650 MG tablet [id number]  Take 1 tablet (650 mg total) by mouth 3 (three) times daily. Arnetha Courser, MD  Active   thiamine (VITAMIN B-1) 100 MG tablet [id number]  Take 1 tablet (100 mg total) by mouth daily. Arnetha Courser, MD  Active   traMADol Janean Sark) 50 MG tablet [id number]  Take 1 tablet (50 mg  total) by mouth every 6 (six) hours as needed for severe pain (pain score 7-10). Arnetha Courser, MD  Active   Vitamin D, Ergocalciferol, (DRISDOL) 1.25 MG (50000 UNIT) CAPS capsule [id number]  Take 1 capsule (50,000 Units total) by mouth every 7 (seven) days. Arnetha Courser, MD  Active            Home Care and Equipment/Supplies: Were Home Health Services Ordered?: No Any new equipment or medical supplies ordered?: No  Functional Questionnaire: Do you need assistance with bathing/showering or dressing?: No Do you need assistance with meal preparation?: No Do you need assistance with eating?: No Do you have difficulty maintaining continence: No Do you need assistance with getting out of bed/getting out of a chair/moving?: No Do you have difficulty managing or taking your medications?: No  Follow up appointments reviewed: PCP Follow-up appointment confirmed?: Yes Date of PCP follow-up appointment?: 11/13/23 Follow-up Provider: non- [facility] PCP: "I go to a PCP at [facility] Follow-up appointment confirmed?: Yes Date of Specialist follow-up appointment?: 11/06/23 Follow-Up Specialty Provider:: hematology provider Do you need transportation to your follow-up appointment?: No Do you understand care options if your condition(s) worsen?: Yes-patient verbalized understanding  SDOH Interventions Today    Flowsheet Row Most Recent Value  SDOH Interventions   Food Insecurity Interventions Patient Declined  [on public transportation: very loud/ patient declines full TOC call and confirms has nhon-[facility] PCP]  Housing Interventions Patient Declined  [on public transportation: very loud/ patient declines full TOC call and confirms has non-[facility] PCP]  Transportation Interventions Patient Declined  [on public transportation: very loud/ patient declines full TOC call and confirms has [facility] PCP]  Utilities Interventions Patient Declined  [on public transportation: very loud/  patient declines full TOC call and confirms has [facility] PCP]      Interventions Today    Flowsheet Row Most Recent Value  Chronic Disease   Chronic disease during today's visit Other  [AKI in setting of CKD 3]  General Interventions   General Interventions Discussed/Reviewed General Interventions Discussed, Doctor Visits  Doctor Visits Discussed/Reviewed Specialist, PCP, Doctor Visits Discussed  PCP/Specialist Visits Compliance with follow-up visit      TOC Interventions Today    Flowsheet Row Most Recent Value  TOC Interventions   TOC Interventions Discussed/Reviewed TOC Interventions Discussed      Total time spent from review to signing of note/ including any care coordination interventions: 31 minutes  Pls call/ message for questions,  Caryl Pina, RN, BSN, Media planner  Transitions of Care  [facility] Population Health  [place] [phone]: direct office
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
## 2023-11-07 LAB — FERRITIN: Ferritin: 2015 ng/mL — ABNORMAL HIGH (ref 11–307)

## 2023-11-08 ENCOUNTER — Telehealth: Payer: Self-pay | Admitting: Hematology and Oncology

## 2023-11-08 LAB — PROTEIN ELECTROPHORESIS, SERUM, WITH REFLEX
A/G Ratio: 0.9 (ref 0.7–1.7)
Albumin ELP: 2.8 g/dL — ABNORMAL LOW (ref 2.9–4.4)
Alpha-1-Globulin: 0.4 g/dL (ref 0.0–0.4)
Alpha-2-Globulin: 1 g/dL (ref 0.4–1.0)
Beta Globulin: 1 g/dL (ref 0.7–1.3)
Gamma Globulin: 0.9 g/dL (ref 0.4–1.8)
Globulin, Total: 3.2 g/dL (ref 2.2–3.9)
Total Protein ELP: 6 g/dL (ref 6.0–8.5)

## 2023-11-08 NOTE — Telephone Encounter (Signed)
 Left patient a vm regarding upcoming appointment

## 2023-11-15 ENCOUNTER — Telehealth: Payer: Self-pay | Admitting: Hematology and Oncology

## 2023-11-15 NOTE — Telephone Encounter (Signed)
 Spoke with patient confirming upcoming appointment

## 2023-11-21 ENCOUNTER — Telehealth: Payer: Self-pay | Admitting: *Deleted

## 2023-11-21 ENCOUNTER — Encounter: Payer: Self-pay | Admitting: *Deleted

## 2023-11-21 NOTE — Telephone Encounter (Signed)
 This RN spoke with pt per urine sample obtained on 11/06/2022.  Noted pt had left sample though MD didn't order a urine with lab upon visit but pt left it and requested to be done.  Pt was very pleasant in discussion though she stated " I can't keep up with what doctor is doing what"  with this RN having to give information further as in our location for her to remember and recall visit.  She was seen by her primary MD and kidney MD as well (now receiving aranesp injections at Dr Valentino Nose) after visit in this office but does not recall if the urine results were discussed.  She denies any current urine issues - not pain, no fevers, no urgency.  She does state her urine "has a bad odor but I think that is from the medications I was on "  She has not been put on any antibiotics.  She is scheduled for follow up in this office next week with visit.   Informed pt discussion will be forwarded to MD for review and if any further recommendations needed and this RN will call her tomorrow.  Pt stated appreciation of follow up and concern for her health.

## 2023-11-21 NOTE — Telephone Encounter (Signed)
 This RN attempted to reach pt several times with noted recording that VM box is full.  This RN sent pt a message per noted active My Chart account.

## 2023-11-29 ENCOUNTER — Inpatient Hospital Stay (HOSPITAL_BASED_OUTPATIENT_CLINIC_OR_DEPARTMENT_OTHER): Admitting: Adult Health

## 2023-11-29 ENCOUNTER — Inpatient Hospital Stay

## 2023-11-29 VITALS — BP 145/98 | HR 121 | Temp 97.5°F | Resp 17 | Wt 144.8 lb

## 2023-11-29 DIAGNOSIS — D539 Nutritional anemia, unspecified: Secondary | ICD-10-CM | POA: Diagnosis not present

## 2023-11-29 DIAGNOSIS — I129 Hypertensive chronic kidney disease with stage 1 through stage 4 chronic kidney disease, or unspecified chronic kidney disease: Secondary | ICD-10-CM | POA: Diagnosis not present

## 2023-11-29 DIAGNOSIS — D638 Anemia in other chronic diseases classified elsewhere: Secondary | ICD-10-CM

## 2023-11-29 LAB — FOLATE: Folate: 11.8 ng/mL (ref >5.9)

## 2023-11-29 LAB — URINALYSIS, COMPLETE (UACMP) WITH MICROSCOPIC
Bilirubin Urine: NEGATIVE
Glucose, UA: NEGATIVE mg/dL
Hgb urine dipstick: NEGATIVE
Ketones, ur: NEGATIVE mg/dL
Nitrite: NEGATIVE
Protein, ur: 100 mg/dL — AB
Specific Gravity, Urine: 1.012 (ref 1.005–1.030)
WBC, UA: >50 WBC/hpf (ref 0–5)
pH: 5 (ref 5.0–8.0)

## 2023-11-29 LAB — CBC WITH DIFFERENTIAL/PLATELET
Abs Immature Granulocytes: 0.03 10*3/uL (ref 0.00–0.07)
Basophils Absolute: 0 10*3/uL (ref 0.0–0.1)
Basophils Relative: 1 %
Eosinophils Absolute: 0.1 10*3/uL (ref 0.0–0.5)
Eosinophils Relative: 1 %
HCT: 28.4 % — ABNORMAL LOW (ref 36.0–46.0)
Hemoglobin: 9.2 g/dL — ABNORMAL LOW (ref 12.0–15.0)
Immature Granulocytes: 0 %
Lymphocytes Relative: 22 %
Lymphs Abs: 1.9 10*3/uL (ref 0.7–4.0)
MCH: 33.5 pg (ref 26.0–34.0)
MCHC: 32.4 g/dL (ref 30.0–36.0)
MCV: 103.3 fL — ABNORMAL HIGH (ref 80.0–100.0)
Monocytes Absolute: 0.5 10*3/uL (ref 0.1–1.0)
Monocytes Relative: 6 %
Neutro Abs: 6.3 10*3/uL (ref 1.7–7.7)
Neutrophils Relative %: 70 %
Platelets: 161 10*3/uL (ref 150–400)
RBC: 2.75 MIL/uL — ABNORMAL LOW (ref 3.87–5.11)
RDW: 20.1 % — ABNORMAL HIGH (ref 11.5–15.5)
WBC: 8.8 10*3/uL (ref 4.0–10.5)
nRBC: 0 % (ref 0.0–0.2)

## 2023-11-29 LAB — RETIC PANEL
Immature Retic Fract: 33.4 % — ABNORMAL HIGH (ref 2.3–15.9)
RBC.: 2.74 MIL/uL — ABNORMAL LOW (ref 3.87–5.11)
Retic Count, Absolute: 91.5 10*3/uL (ref 19.0–186.0)
Retic Ct Pct: 3.3 % — ABNORMAL HIGH (ref 0.4–3.1)
Reticulocyte Hemoglobin: 38.2 pg (ref >27.9)

## 2023-11-30 ENCOUNTER — Telehealth: Payer: Self-pay | Admitting: Licensed Clinical Social Worker

## 2023-11-30 ENCOUNTER — Inpatient Hospital Stay: Admitting: Licensed Clinical Social Worker

## 2023-11-30 ENCOUNTER — Encounter: Payer: Self-pay | Admitting: Adult Health

## 2023-11-30 ENCOUNTER — Telehealth: Payer: Self-pay | Admitting: Adult Health

## 2023-11-30 LAB — KAPPA/LAMBDA LIGHT CHAINS
Kappa free light chain: 88.4 mg/L — ABNORMAL HIGH (ref 3.3–19.4)
Kappa, lambda light chain ratio: 1.21 (ref 0.26–1.65)
Lambda free light chains: 73.3 mg/L — ABNORMAL HIGH (ref 5.7–26.3)

## 2023-11-30 NOTE — Progress Notes (Signed)
 [facility] Clinical Social Work  Clinical Social Work was referred by  Deforest Hoyles  for assessment of psychosocial needs.  Clinical Social Worker contacted patient by phone to offer support and assess for needs.   Pt was not able to speak in depth as she had people doing work at her home, but did confirm she is interested in therapy resources as well as transportation information through her insurance.  She agreed to receive information through MyChart and CSW sent a message with pertinent information today.     Hubbert Landrigan E Joyelle Siedlecki, LCSW  Clinical Social Worker [facility]        Patient is participating in a Managed Medicaid Plan:  Yes

## 2023-11-30 NOTE — Telephone Encounter (Signed)
 [facility] Clinical Social Work  Clinical Social Work was referred by  L. Causey  for assessment of psychosocial needs.  Clinical Social Worker attempted to contact patient by phone to offer support and assess for needs.   No answer. VM full, unable to leave message.     Paw Karstens E Jazelle Achey, LCSW  Clinical Social Worker [facility]        Patient is participating in a Managed Medicaid Plan:  Yes

## 2023-11-30 NOTE — Telephone Encounter (Signed)
 Rescheduled appointment per 3/27 los. Talked with the patient and she is aware of the changes made to her upcoming appointments.

## 2023-12-01 LAB — URINE CULTURE: Culture: >=100000 — AB
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
## 2023-12-02 NOTE — Assessment & Plan Note (Signed)
 Anemia Hemoglobin improved to 9.2 g/dL, indicating treatment response but ongoing anemia. Alcohol may impair B12 absorption. - Administer Arenesp injection tomorrow  - Recommend sublingual B12 supplement. - Reviewed with Dr. Al Pimple who requests additional lab testing which was ordered - Patient with dysuria, urinalysis and urine culture obtained.   Diarrhea Diarrhea likely due to lactose intolerance from Ensure consumption. - Advise to avoid Ensure. - Consider lactose-free protein supplements.  Depression Significant depression likely exacerbated by personal losses. Considering Zoloft and therapy. - Offer referral to Child psychotherapist for resources. - Encourage follow-up with primary care for Zoloft. - Discuss therapy options with primary care.  Transportation difficulties Transportation issues affecting appointment attendance. Exploring insurance-covered options. - Social worker to assist with transportation options.  Follow-up Confusion regarding appointments. Scheduled with primary care next Tuesday and this clinic in two weeks. - Schedule follow-up in two weeks. - Ensure access to schedule via MyChart.

## 2023-12-03 ENCOUNTER — Other Ambulatory Visit: Payer: Self-pay | Admitting: *Deleted

## 2023-12-03 LAB — HGB FRACTIONATION CASCADE
Hgb A2: 2.8 % (ref 1.8–3.2)
Hgb A: 97.2 % (ref 96.4–98.8)
Hgb F: 0 % (ref 0.0–2.0)
Hgb S: 0 %

## 2023-12-03 LAB — VITAMIN B1: Vitamin B1 (Thiamine): 101.1 nmol/L (ref 66.5–200.0)

## 2023-12-03 MED ORDER — SULFAMETHOXAZOLE-TRIMETHOPRIM 800-160 MG PO TABS: 1.0000 | ORAL_TABLET | Freq: Two times a day (BID) | ORAL | 0 refills | Status: DC

## 2023-12-03 NOTE — Progress Notes (Signed)
 Per Lillard Anes, NP, called pt to make aware that she does have a UTI and Bactrim DS x2 daily for 10 days was sent to her pharmacy on file. Pt verbalized understanding

## 2023-12-06 ENCOUNTER — Other Ambulatory Visit

## 2023-12-06 ENCOUNTER — Ambulatory Visit: Admitting: Hematology and Oncology

## 2023-12-13 ENCOUNTER — Other Ambulatory Visit: Payer: Self-pay | Admitting: *Deleted

## 2023-12-13 ENCOUNTER — Inpatient Hospital Stay: Attending: Hematology and Oncology

## 2023-12-13 ENCOUNTER — Inpatient Hospital Stay (HOSPITAL_BASED_OUTPATIENT_CLINIC_OR_DEPARTMENT_OTHER): Admitting: Hematology and Oncology

## 2023-12-13 VITALS — BP 155/90 | HR 99 | Temp 97.3°F | Resp 18 | Ht 71.0 in | Wt 146.5 lb

## 2023-12-13 DIAGNOSIS — D631 Anemia in chronic kidney disease: Secondary | ICD-10-CM | POA: Insufficient documentation

## 2023-12-13 DIAGNOSIS — G4700 Insomnia, unspecified: Secondary | ICD-10-CM | POA: Diagnosis not present

## 2023-12-13 DIAGNOSIS — R309 Painful micturition, unspecified: Secondary | ICD-10-CM

## 2023-12-13 DIAGNOSIS — D539 Nutritional anemia, unspecified: Secondary | ICD-10-CM | POA: Insufficient documentation

## 2023-12-13 DIAGNOSIS — F17210 Nicotine dependence, cigarettes, uncomplicated: Secondary | ICD-10-CM | POA: Insufficient documentation

## 2023-12-13 DIAGNOSIS — I129 Hypertensive chronic kidney disease with stage 1 through stage 4 chronic kidney disease, or unspecified chronic kidney disease: Secondary | ICD-10-CM | POA: Diagnosis not present

## 2023-12-13 DIAGNOSIS — F32A Depression, unspecified: Secondary | ICD-10-CM | POA: Insufficient documentation

## 2023-12-13 DIAGNOSIS — F1020 Alcohol dependence, uncomplicated: Secondary | ICD-10-CM | POA: Insufficient documentation

## 2023-12-13 DIAGNOSIS — F419 Anxiety disorder, unspecified: Secondary | ICD-10-CM | POA: Insufficient documentation

## 2023-12-13 DIAGNOSIS — D638 Anemia in other chronic diseases classified elsewhere: Secondary | ICD-10-CM

## 2023-12-13 DIAGNOSIS — N1832 Chronic kidney disease, stage 3b: Secondary | ICD-10-CM | POA: Diagnosis not present

## 2023-12-13 LAB — CBC WITH DIFFERENTIAL/PLATELET
Abs Immature Granulocytes: 0.02 10*3/uL (ref 0.00–0.07)
Basophils Absolute: 0 10*3/uL (ref 0.0–0.1)
Basophils Relative: 1 %
Eosinophils Absolute: 0.1 10*3/uL (ref 0.0–0.5)
Eosinophils Relative: 2 %
HCT: 28.2 % — ABNORMAL LOW (ref 36.0–46.0)
Hemoglobin: 9.3 g/dL — ABNORMAL LOW (ref 12.0–15.0)
Immature Granulocytes: 0 %
Lymphocytes Relative: 26 %
Lymphs Abs: 1.7 10*3/uL (ref 0.7–4.0)
MCH: 33.7 pg (ref 26.0–34.0)
MCHC: 33 g/dL (ref 30.0–36.0)
MCV: 102.2 fL — ABNORMAL HIGH (ref 80.0–100.0)
Monocytes Absolute: 0.4 10*3/uL (ref 0.1–1.0)
Monocytes Relative: 6 %
Neutro Abs: 4.4 10*3/uL (ref 1.7–7.7)
Neutrophils Relative %: 65 %
Platelets: 225 10*3/uL (ref 150–400)
RBC: 2.76 MIL/uL — ABNORMAL LOW (ref 3.87–5.11)
RDW: 18.6 % — ABNORMAL HIGH (ref 11.5–15.5)
WBC: 6.6 10*3/uL (ref 4.0–10.5)
nRBC: 0 % (ref 0.0–0.2)

## 2023-12-13 LAB — URINALYSIS, COMPLETE (UACMP) WITH MICROSCOPIC
Bilirubin Urine: NEGATIVE
Glucose, UA: NEGATIVE mg/dL
Hgb urine dipstick: NEGATIVE
Ketones, ur: NEGATIVE mg/dL
Leukocytes,Ua: NEGATIVE
Nitrite: NEGATIVE
Protein, ur: 30 mg/dL — AB
Specific Gravity, Urine: 1.009 (ref 1.005–1.030)
pH: 5 (ref 5.0–8.0)

## 2023-12-13 MED ORDER — SCOPOLAMINE 1 MG/3DAYS TD PT72: 1.0000 | MEDICATED_PATCH | TRANSDERMAL | 0 refills | Status: DC
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
## 2023-12-14 ENCOUNTER — Other Ambulatory Visit: Payer: Self-pay | Admitting: *Deleted

## 2023-12-14 ENCOUNTER — Telehealth: Payer: Self-pay | Admitting: Hematology and Oncology

## 2023-12-14 DIAGNOSIS — R309 Painful micturition, unspecified: Secondary | ICD-10-CM

## 2023-12-14 NOTE — Telephone Encounter (Signed)
 Spoke with patient confirming upcoming appointment

## 2023-12-16 LAB — URINE CULTURE: Culture: 20000 — AB

## 2023-12-17 ENCOUNTER — Telehealth: Payer: Self-pay

## 2023-12-17 NOTE — Telephone Encounter (Signed)
 Followed up with patient regarding abnormal urine specimen results.  Patient reports that she is not experiencing any urinary symptoms at this time. No burning sensations, increased urinary frequency, nausea, chills or fever noted at this time.  Patient reports taking prescription in its entirety for UTI. She is still concerned with abnormal results.  Patient is following up with PCP later this week. Dr. Arno Bibles aware.

## 2023-12-28 ENCOUNTER — Encounter: Payer: Self-pay | Admitting: *Deleted

## 2023-12-28 ENCOUNTER — Inpatient Hospital Stay

## 2023-12-28 DIAGNOSIS — D539 Nutritional anemia, unspecified: Secondary | ICD-10-CM | POA: Diagnosis not present

## 2023-12-28 DIAGNOSIS — D638 Anemia in other chronic diseases classified elsewhere: Secondary | ICD-10-CM

## 2023-12-28 LAB — CBC WITH DIFFERENTIAL/PLATELET
Abs Immature Granulocytes: 0.02 10*3/uL (ref 0.00–0.07)
Basophils Absolute: 0.1 10*3/uL (ref 0.0–0.1)
Basophils Relative: 1 %
Eosinophils Absolute: 0.1 10*3/uL (ref 0.0–0.5)
Eosinophils Relative: 1 %
HCT: 30.1 % — ABNORMAL LOW (ref 36.0–46.0)
Hemoglobin: 10 g/dL — ABNORMAL LOW (ref 12.0–15.0)
Immature Granulocytes: 0 %
Lymphocytes Relative: 24 %
Lymphs Abs: 1.6 10*3/uL (ref 0.7–4.0)
MCH: 34.2 pg — ABNORMAL HIGH (ref 26.0–34.0)
MCHC: 33.2 g/dL (ref 30.0–36.0)
MCV: 103.1 fL — ABNORMAL HIGH (ref 80.0–100.0)
Monocytes Absolute: 0.3 10*3/uL (ref 0.1–1.0)
Monocytes Relative: 5 %
Neutro Abs: 4.5 10*3/uL (ref 1.7–7.7)
Neutrophils Relative %: 69 %
Platelets: 221 10*3/uL (ref 150–400)
RBC: 2.92 MIL/uL — ABNORMAL LOW (ref 3.87–5.11)
RDW: 17.2 % — ABNORMAL HIGH (ref 11.5–15.5)
WBC: 6.5 10*3/uL (ref 4.0–10.5)
nRBC: 0 % (ref 0.0–0.2)

## 2023-12-28 LAB — VITAMIN B12: Vitamin B-12: 541 pg/mL (ref 180–914)

## 2024-01-01 ENCOUNTER — Other Ambulatory Visit: Payer: Self-pay | Admitting: *Deleted

## 2024-01-01 ENCOUNTER — Telehealth: Payer: Self-pay | Admitting: *Deleted

## 2024-01-01 NOTE — Telephone Encounter (Signed)
 This RN received VM from pt stating per her communication with [facility] her prescription for the aranesp  needs to be sent to [facility].  Noted aranesp  was ordered by hospitalist in March upon discharge.  Noted pt is to be seen by Dr Louis Row per nephrology with appt scheduled for 5/8 - this RN asked to speak with his nurse/cma - transferred to VM of [facility].  Message left per above and need for clarification on ordering prescriber as this office is monitoring heme and iron levels - aranesp  has been order for kidney issues

## 2024-01-03 ENCOUNTER — Other Ambulatory Visit: Payer: Self-pay | Admitting: *Deleted

## 2024-01-03 MED ORDER — DARBEPOETIN ALFA 100 MCG/0.5ML IJ SOSY: 100.0000 ug | PREFILLED_SYRINGE | INTRAMUSCULAR | 0 refills | Status: AC

## 2024-01-03 NOTE — Telephone Encounter (Signed)
 Per follow up on who needs to prescribe darbepoetin - with no response from nephrologist office - refill obtained per Dr Arno Bibles.  Refill sent to [facility] per pt's request for best cost coverage.

## 2024-01-06 ENCOUNTER — Encounter (HOSPITAL_COMMUNITY): Payer: Self-pay

## 2024-01-06 ENCOUNTER — Ambulatory Visit (HOSPITAL_COMMUNITY)
Admission: EM | Admit: 2024-01-06 | Discharge: 2024-01-06 | Disposition: A | Discharge status: Home / Self Care | Attending: Internal Medicine | Admitting: Internal Medicine

## 2024-01-06 ENCOUNTER — Ambulatory Visit (HOSPITAL_COMMUNITY)

## 2024-01-06 ENCOUNTER — Ambulatory Visit (INDEPENDENT_AMBULATORY_CARE_PROVIDER_SITE_OTHER)

## 2024-01-06 DIAGNOSIS — M79671 Pain in right foot: Secondary | ICD-10-CM

## 2024-01-06 DIAGNOSIS — S93491A Sprain of other ligament of right ankle, initial encounter: Secondary | ICD-10-CM

## 2024-01-06 NOTE — Discharge Instructions (Addendum)
 X-ray done today of the right foot and right ankle.  Final evaluation by the radiologist shows no evidence of acute fracture.  This does not rule out a ligament injury.  Given the pain and swelling this is likely a high-grade ankle sprain. We will place you in an ankle brace today and have you wear this when you are up walking.  Ice the area 2-3 times daily for 10-15 minutes to help with pain and swelling. Do not apply ice directly to the skin.  May use ibuprofen or Tylenol  for pain.  Keep the area elevated to help with swelling.  Recommend following up with orthopedics if symptoms do not improve in the next 1 to 2 days.  Slowly increase activity while wearing the brace after 2-3 more days of resting the area. If able to increase activity without increase in pain then may discontinue the brace after a week.
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
## 2024-01-06 NOTE — ED Triage Notes (Signed)
 Patient reports that she missed a step at a restaurant 2 days ago. Patient has swelling and pain to the right ankle.   Patient states she took Advil once with no relief.

## 2024-01-08 ENCOUNTER — Encounter: Payer: Self-pay | Admitting: Adult Health

## 2024-01-08 ENCOUNTER — Inpatient Hospital Stay: Attending: Hematology and Oncology

## 2024-01-08 ENCOUNTER — Inpatient Hospital Stay: Attending: Hematology and Oncology | Admitting: Adult Health

## 2024-01-08 VITALS — BP 159/92 | HR 85 | Temp 98.6°F | Resp 18 | Ht 71.0 in | Wt 146.3 lb

## 2024-01-08 DIAGNOSIS — D638 Anemia in other chronic diseases classified elsewhere: Secondary | ICD-10-CM

## 2024-01-08 DIAGNOSIS — F17210 Nicotine dependence, cigarettes, uncomplicated: Secondary | ICD-10-CM | POA: Diagnosis not present

## 2024-01-08 DIAGNOSIS — D539 Nutritional anemia, unspecified: Secondary | ICD-10-CM | POA: Insufficient documentation

## 2024-01-08 DIAGNOSIS — F1020 Alcohol dependence, uncomplicated: Secondary | ICD-10-CM | POA: Insufficient documentation

## 2024-01-08 LAB — CBC WITH DIFFERENTIAL/PLATELET
Abs Immature Granulocytes: 0.02 10*3/uL (ref 0.00–0.07)
Basophils Absolute: 0 10*3/uL (ref 0.0–0.1)
Basophils Relative: 1 %
Eosinophils Absolute: 0.2 10*3/uL (ref 0.0–0.5)
Eosinophils Relative: 2 %
HCT: 31.2 % — ABNORMAL LOW (ref 36.0–46.0)
Hemoglobin: 10.5 g/dL — ABNORMAL LOW (ref 12.0–15.0)
Immature Granulocytes: 0 %
Lymphocytes Relative: 25 %
Lymphs Abs: 1.9 10*3/uL (ref 0.7–4.0)
MCH: 34.3 pg — ABNORMAL HIGH (ref 26.0–34.0)
MCHC: 33.7 g/dL (ref 30.0–36.0)
MCV: 102 fL — ABNORMAL HIGH (ref 80.0–100.0)
Monocytes Absolute: 0.3 10*3/uL (ref 0.1–1.0)
Monocytes Relative: 4 %
Neutro Abs: 5.2 10*3/uL (ref 1.7–7.7)
Neutrophils Relative %: 68 %
Platelets: 206 10*3/uL (ref 150–400)
RBC: 3.06 MIL/uL — ABNORMAL LOW (ref 3.87–5.11)
RDW: 15.8 % — ABNORMAL HIGH (ref 11.5–15.5)
WBC: 7.6 10*3/uL (ref 4.0–10.5)
nRBC: 0 % (ref 0.0–0.2)

## 2024-01-08 NOTE — Assessment & Plan Note (Signed)
 Kelsey Poole has anemia of chronic disease with her most recent hemoglobin today being 10.5.  I recommended that when she received the Aranesp  to go ahead and give herself another shot and continue weekly as prescribed by Dr. Arno Bibles.   She will return in 2 weeks for labs and then in 4 weeks for labs and follow-up with Dr. Arno Bibles for any potential dose modifications at that time.
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
## 2024-01-09 ENCOUNTER — Telehealth: Payer: Self-pay | Admitting: Hematology and Oncology

## 2024-01-09 NOTE — Telephone Encounter (Signed)
 Confirmed with pt scheduled appt date and time

## 2024-01-25 ENCOUNTER — Inpatient Hospital Stay

## 2024-01-25 ENCOUNTER — Other Ambulatory Visit: Payer: Self-pay | Admitting: *Deleted

## 2024-01-25 ENCOUNTER — Ambulatory Visit: Payer: Self-pay | Admitting: Hematology and Oncology

## 2024-01-25 DIAGNOSIS — D539 Nutritional anemia, unspecified: Secondary | ICD-10-CM | POA: Diagnosis not present

## 2024-01-25 DIAGNOSIS — D638 Anemia in other chronic diseases classified elsewhere: Secondary | ICD-10-CM

## 2024-01-25 DIAGNOSIS — R309 Painful micturition, unspecified: Secondary | ICD-10-CM

## 2024-01-25 LAB — CBC WITH DIFFERENTIAL/PLATELET
Abs Immature Granulocytes: 0.02 10*3/uL (ref 0.00–0.07)
Basophils Absolute: 0 10*3/uL (ref 0.0–0.1)
Basophils Relative: 0 %
Eosinophils Absolute: 0.1 10*3/uL (ref 0.0–0.5)
Eosinophils Relative: 2 %
HCT: 33.7 % — ABNORMAL LOW (ref 36.0–46.0)
Hemoglobin: 11.5 g/dL — ABNORMAL LOW (ref 12.0–15.0)
Immature Granulocytes: 0 %
Lymphocytes Relative: 17 %
Lymphs Abs: 1.4 10*3/uL (ref 0.7–4.0)
MCH: 34.1 pg — ABNORMAL HIGH (ref 26.0–34.0)
MCHC: 34.1 g/dL (ref 30.0–36.0)
MCV: 100 fL (ref 80.0–100.0)
Monocytes Absolute: 0.5 10*3/uL (ref 0.1–1.0)
Monocytes Relative: 6 %
Neutro Abs: 6.1 10*3/uL (ref 1.7–7.7)
Neutrophils Relative %: 75 %
Platelets: 196 10*3/uL (ref 150–400)
RBC: 3.37 MIL/uL — ABNORMAL LOW (ref 3.87–5.11)
RDW: 13.5 % (ref 11.5–15.5)
WBC: 8.2 10*3/uL (ref 4.0–10.5)
nRBC: 0 % (ref 0.0–0.2)

## 2024-01-25 LAB — URINALYSIS, COMPLETE (UACMP) WITH MICROSCOPIC
Bacteria, UA: NONE SEEN
Bilirubin Urine: NEGATIVE
Glucose, UA: NEGATIVE mg/dL
Hgb urine dipstick: NEGATIVE
Ketones, ur: NEGATIVE mg/dL
Nitrite: NEGATIVE
Protein, ur: 100 mg/dL — AB
Specific Gravity, Urine: 1.013 (ref 1.005–1.030)
pH: 5 (ref 5.0–8.0)

## 2024-01-27 LAB — URINE CULTURE: Culture: 30000 — AB

## 2024-01-29 MED ORDER — NITROFURANTOIN MONOHYD MACRO 100 MG PO CAPS: 100.0000 mg | ORAL_CAPSULE | Freq: Two times a day (BID) | ORAL | 0 refills | Status: DC

## 2024-01-29 NOTE — Telephone Encounter (Signed)
 This RN spoke with Ammon Bales per her UA results - and need for antibiotic per culture- but this RN also informed of ongoing concern with repeated UA's - antibiotic has been sent in per MD in this office.  Note -pt ask for a UA with her CBC lab per getting epogen ( per home injection) approximately with every lab draw.  This RN inquired with pt concern that she is having ongoing urinary symptoms that this office may not be the best to manage due to her other issues.  This RN informed pt she needs to discuss the above with her nephrologist per ongoing issues and symptoms.  This RN also discussed need for increase hydration for benefit.  Per discussion pt verbalized that she will not ask for a UA in this office   This note will be forwarded to the pt's nephrologist - and primary MD for review of discussion for best follow up.

## 2024-02-05 ENCOUNTER — Telehealth: Payer: Self-pay

## 2024-02-05 NOTE — Telephone Encounter (Signed)
 Verbally confirmed appt for 6/4

## 2024-02-06 ENCOUNTER — Inpatient Hospital Stay (HOSPITAL_BASED_OUTPATIENT_CLINIC_OR_DEPARTMENT_OTHER): Admitting: Hematology and Oncology

## 2024-02-06 ENCOUNTER — Inpatient Hospital Stay: Attending: Hematology and Oncology

## 2024-02-06 VITALS — BP 163/96 | HR 92 | Temp 100.0°F | Resp 17 | Wt 148.1 lb

## 2024-02-06 DIAGNOSIS — D539 Nutritional anemia, unspecified: Secondary | ICD-10-CM

## 2024-02-06 DIAGNOSIS — R8271 Bacteriuria: Secondary | ICD-10-CM | POA: Insufficient documentation

## 2024-02-06 DIAGNOSIS — R3989 Other symptoms and signs involving the genitourinary system: Secondary | ICD-10-CM | POA: Diagnosis not present

## 2024-02-06 DIAGNOSIS — F17210 Nicotine dependence, cigarettes, uncomplicated: Secondary | ICD-10-CM | POA: Diagnosis not present

## 2024-02-06 DIAGNOSIS — D631 Anemia in chronic kidney disease: Secondary | ICD-10-CM | POA: Diagnosis present

## 2024-02-06 DIAGNOSIS — G4700 Insomnia, unspecified: Secondary | ICD-10-CM | POA: Diagnosis not present

## 2024-02-06 DIAGNOSIS — N1832 Chronic kidney disease, stage 3b: Secondary | ICD-10-CM | POA: Diagnosis present

## 2024-02-06 DIAGNOSIS — F1020 Alcohol dependence, uncomplicated: Secondary | ICD-10-CM | POA: Insufficient documentation

## 2024-02-06 DIAGNOSIS — F1090 Alcohol use, unspecified, uncomplicated: Secondary | ICD-10-CM | POA: Diagnosis not present

## 2024-02-06 DIAGNOSIS — D638 Anemia in other chronic diseases classified elsewhere: Secondary | ICD-10-CM

## 2024-02-06 LAB — CBC WITH DIFFERENTIAL/PLATELET
Abs Immature Granulocytes: 0.1 10*3/uL — ABNORMAL HIGH (ref 0.00–0.07)
Basophils Absolute: 0 10*3/uL (ref 0.0–0.1)
Basophils Relative: 0 %
Eosinophils Absolute: 0 10*3/uL (ref 0.0–0.5)
Eosinophils Relative: 0 %
HCT: 36.3 % (ref 36.0–46.0)
Hemoglobin: 12.1 g/dL (ref 12.0–15.0)
Immature Granulocytes: 1 %
Lymphocytes Relative: 11 %
Lymphs Abs: 1.4 10*3/uL (ref 0.7–4.0)
MCH: 33.1 pg (ref 26.0–34.0)
MCHC: 33.3 g/dL (ref 30.0–36.0)
MCV: 99.2 fL (ref 80.0–100.0)
Monocytes Absolute: 0.7 10*3/uL (ref 0.1–1.0)
Monocytes Relative: 5 %
Neutro Abs: 11.1 10*3/uL — ABNORMAL HIGH (ref 1.7–7.7)
Neutrophils Relative %: 83 %
Platelets: 209 10*3/uL (ref 150–400)
RBC: 3.66 MIL/uL — ABNORMAL LOW (ref 3.87–5.11)
RDW: 13.3 % (ref 11.5–15.5)
WBC: 13.3 10*3/uL — ABNORMAL HIGH (ref 4.0–10.5)
nRBC: 0 % (ref 0.0–0.2)
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
## 2024-02-12 ENCOUNTER — Other Ambulatory Visit

## 2024-03-13 ENCOUNTER — Other Ambulatory Visit: Payer: Self-pay | Admitting: *Deleted

## 2024-03-13 ENCOUNTER — Inpatient Hospital Stay: Attending: Hematology and Oncology

## 2024-03-13 ENCOUNTER — Ambulatory Visit: Payer: Self-pay | Admitting: *Deleted

## 2024-03-13 DIAGNOSIS — G621 Alcoholic polyneuropathy: Secondary | ICD-10-CM | POA: Insufficient documentation

## 2024-03-13 DIAGNOSIS — D631 Anemia in chronic kidney disease: Secondary | ICD-10-CM | POA: Insufficient documentation

## 2024-03-13 DIAGNOSIS — F1020 Alcohol dependence, uncomplicated: Secondary | ICD-10-CM | POA: Diagnosis not present

## 2024-03-13 DIAGNOSIS — R600 Localized edema: Secondary | ICD-10-CM | POA: Diagnosis not present

## 2024-03-13 DIAGNOSIS — I129 Hypertensive chronic kidney disease with stage 1 through stage 4 chronic kidney disease, or unspecified chronic kidney disease: Secondary | ICD-10-CM | POA: Insufficient documentation

## 2024-03-13 DIAGNOSIS — D638 Anemia in other chronic diseases classified elsewhere: Secondary | ICD-10-CM

## 2024-03-13 DIAGNOSIS — R634 Abnormal weight loss: Secondary | ICD-10-CM | POA: Insufficient documentation

## 2024-03-13 DIAGNOSIS — D539 Nutritional anemia, unspecified: Secondary | ICD-10-CM

## 2024-03-13 DIAGNOSIS — N1832 Chronic kidney disease, stage 3b: Secondary | ICD-10-CM | POA: Insufficient documentation

## 2024-03-13 DIAGNOSIS — F17210 Nicotine dependence, cigarettes, uncomplicated: Secondary | ICD-10-CM | POA: Insufficient documentation

## 2024-03-13 LAB — URINALYSIS, COMPLETE (UACMP) WITH MICROSCOPIC
Bacteria, UA: NONE SEEN
Bilirubin Urine: NEGATIVE
Glucose, UA: NEGATIVE mg/dL
Hgb urine dipstick: NEGATIVE
Ketones, ur: NEGATIVE mg/dL
Nitrite: NEGATIVE
Protein, ur: 100 mg/dL — AB
Specific Gravity, Urine: 1.011 (ref 1.005–1.030)
pH: 5 (ref 5.0–8.0)

## 2024-03-13 LAB — CBC WITH DIFFERENTIAL/PLATELET
Abs Immature Granulocytes: 0.02 K/uL (ref 0.00–0.07)
Basophils Absolute: 0 K/uL (ref 0.0–0.1)
Basophils Relative: 0 %
Eosinophils Absolute: 0.1 K/uL (ref 0.0–0.5)
Eosinophils Relative: 1 %
HCT: 28.9 % — ABNORMAL LOW (ref 36.0–46.0)
Hemoglobin: 9.6 g/dL — ABNORMAL LOW (ref 12.0–15.0)
Immature Granulocytes: 0 %
Lymphocytes Relative: 25 %
Lymphs Abs: 1.7 K/uL (ref 0.7–4.0)
MCH: 31.9 pg (ref 26.0–34.0)
MCHC: 33.2 g/dL (ref 30.0–36.0)
MCV: 96 fL (ref 80.0–100.0)
Monocytes Absolute: 0.4 K/uL (ref 0.1–1.0)
Monocytes Relative: 6 %
Neutro Abs: 4.4 K/uL (ref 1.7–7.7)
Neutrophils Relative %: 68 %
Platelets: 203 K/uL (ref 150–400)
RBC: 3.01 MIL/uL — ABNORMAL LOW (ref 3.87–5.11)
RDW: 13.8 % (ref 11.5–15.5)
WBC: 6.7 K/uL (ref 4.0–10.5)
nRBC: 0 % (ref 0.0–0.2)

## 2024-03-14 ENCOUNTER — Ambulatory Visit (HOSPITAL_COMMUNITY)
Admission: RE | Admit: 2024-03-14 | Discharge: 2024-03-14 | Disposition: A | Discharge status: Home / Self Care | Source: Ambulatory Visit | Attending: Emergency Medicine | Admitting: Emergency Medicine

## 2024-03-14 ENCOUNTER — Encounter (HOSPITAL_COMMUNITY): Payer: Self-pay

## 2024-03-14 VITALS — BP 142/94 | HR 98 | Temp 98.1°F | Resp 18 | Ht 71.0 in

## 2024-03-14 DIAGNOSIS — J45901 Unspecified asthma with (acute) exacerbation: Secondary | ICD-10-CM

## 2024-03-14 DIAGNOSIS — H00015 Hordeolum externum left lower eyelid: Secondary | ICD-10-CM | POA: Diagnosis not present

## 2024-03-14 DIAGNOSIS — J209 Acute bronchitis, unspecified: Secondary | ICD-10-CM | POA: Diagnosis not present

## 2024-03-14 MED ORDER — ALBUTEROL SULFATE HFA 108 (90 BASE) MCG/ACT IN AERS: 1.0000 | INHALATION_SPRAY | Freq: Four times a day (QID) | RESPIRATORY_TRACT | 0 refills | Status: AC | PRN

## 2024-03-14 MED ORDER — POLYMYXIN B-TRIMETHOPRIM 10000-0.1 UNIT/ML-% OP SOLN: 1.0000 [drp] | Freq: Four times a day (QID) | OPHTHALMIC | 0 refills | Status: AC

## 2024-03-14 MED ORDER — PREDNISONE 20 MG PO TABS: 40.0000 mg | ORAL_TABLET | Freq: Every day | ORAL | 0 refills | Status: AC

## 2024-03-14 NOTE — ED Triage Notes (Signed)
 Patient presenting with left eye pain, swelling, and drainage onset 1 month ago. States she woke up this morning with the eye stuck shut and some bleeding.  Prescriptions or OTC medications tried: No

## 2024-03-14 NOTE — Discharge Instructions (Signed)
 Continue to use warm compresses to the stye.  Afterwards you can place the eyedrops to help prevent bacterial infection.  Follow-up with an eye doctor if no improvement by Monday.  For your continued cough take the prednisone  40 mg daily with breakfast for the next 5 days to help with inflammation.  I have sent a refill of your albuterol  inhaler and you can use this for any wheezing or shortness of breath.  Follow-up with a primary care provider for ongoing management of your asthma.  Return to clinic for any new or urgent symptoms.
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
## 2024-03-16 LAB — URINE CULTURE: Culture: 10000 — AB

## 2024-03-21 ENCOUNTER — Telehealth: Payer: Self-pay | Admitting: *Deleted

## 2024-03-21 ENCOUNTER — Other Ambulatory Visit: Payer: Self-pay | Admitting: *Deleted

## 2024-03-21 DIAGNOSIS — D638 Anemia in other chronic diseases classified elsewhere: Secondary | ICD-10-CM

## 2024-03-21 NOTE — Telephone Encounter (Signed)
 Received message from NP requesting to f/u with pt on UA s/s.  Pt states she is asymptomatic.  Pt also states she is currently scheduled for lab only next week and would prefer to have lab and see Dr. Loretha.  Appts adjusted per pt request.

## 2024-03-27 ENCOUNTER — Inpatient Hospital Stay

## 2024-03-31 ENCOUNTER — Encounter: Payer: Self-pay | Admitting: *Deleted

## 2024-03-31 ENCOUNTER — Ambulatory Visit

## 2024-04-01 ENCOUNTER — Telehealth: Payer: Self-pay

## 2024-04-01 NOTE — Telephone Encounter (Signed)
 Pt verbally confirmed appt for 7/30

## 2024-04-02 ENCOUNTER — Inpatient Hospital Stay

## 2024-04-02 ENCOUNTER — Inpatient Hospital Stay (HOSPITAL_BASED_OUTPATIENT_CLINIC_OR_DEPARTMENT_OTHER): Admitting: Hematology and Oncology

## 2024-04-02 VITALS — BP 154/70 | HR 93 | Temp 98.1°F | Resp 21 | Wt 145.3 lb

## 2024-04-02 DIAGNOSIS — N189 Chronic kidney disease, unspecified: Secondary | ICD-10-CM | POA: Diagnosis not present

## 2024-04-02 DIAGNOSIS — D638 Anemia in other chronic diseases classified elsewhere: Secondary | ICD-10-CM | POA: Diagnosis not present

## 2024-04-02 DIAGNOSIS — I129 Hypertensive chronic kidney disease with stage 1 through stage 4 chronic kidney disease, or unspecified chronic kidney disease: Secondary | ICD-10-CM | POA: Diagnosis not present

## 2024-04-02 LAB — URINALYSIS, COMPLETE (UACMP) WITH MICROSCOPIC
Bilirubin Urine: NEGATIVE
Glucose, UA: NEGATIVE mg/dL
Hgb urine dipstick: NEGATIVE
Ketones, ur: NEGATIVE mg/dL
Nitrite: NEGATIVE
Protein, ur: 100 mg/dL — AB
Specific Gravity, Urine: 1.009 (ref 1.005–1.030)
pH: 6 (ref 5.0–8.0)

## 2024-04-02 LAB — CBC WITH DIFFERENTIAL (CANCER CENTER ONLY)
Abs Immature Granulocytes: 0.08 K/uL — ABNORMAL HIGH (ref 0.00–0.07)
Basophils Absolute: 0 K/uL (ref 0.0–0.1)
Basophils Relative: 1 %
Eosinophils Absolute: 0.1 K/uL (ref 0.0–0.5)
Eosinophils Relative: 1 %
HCT: 27.8 % — ABNORMAL LOW (ref 36.0–46.0)
Hemoglobin: 9.2 g/dL — ABNORMAL LOW (ref 12.0–15.0)
Immature Granulocytes: 1 %
Lymphocytes Relative: 26 %
Lymphs Abs: 2 K/uL (ref 0.7–4.0)
MCH: 31.7 pg (ref 26.0–34.0)
MCHC: 33.1 g/dL (ref 30.0–36.0)
MCV: 95.9 fL (ref 80.0–100.0)
Monocytes Absolute: 0.3 K/uL (ref 0.1–1.0)
Monocytes Relative: 4 %
Neutro Abs: 5.2 K/uL (ref 1.7–7.7)
Neutrophils Relative %: 67 %
Platelet Count: 161 K/uL (ref 150–400)
RBC: 2.9 MIL/uL — ABNORMAL LOW (ref 3.87–5.11)
RDW: 15.6 % — ABNORMAL HIGH (ref 11.5–15.5)
WBC Count: 7.7 K/uL (ref 4.0–10.5)
nRBC: 0 % (ref 0.0–0.2)

## 2024-04-02 LAB — CMP (CANCER CENTER ONLY)
ALT: 16 U/L (ref 0–44)
AST: 43 U/L — ABNORMAL HIGH (ref 15–41)
Albumin: 4.1 g/dL (ref 3.5–5.0)
Alkaline Phosphatase: 124 U/L (ref 38–126)
Anion gap: 15 (ref 5–15)
BUN: 20 mg/dL (ref 8–23)
CO2: 18 mmol/L — ABNORMAL LOW (ref 22–32)
Calcium: 8.9 mg/dL (ref 8.9–10.3)
Chloride: 105 mmol/L (ref 98–111)
Creatinine: 1.6 mg/dL — ABNORMAL HIGH (ref 0.44–1.00)
GFR, Estimated: 36 mL/min — ABNORMAL LOW (ref >60)
Glucose, Bld: 70 mg/dL (ref 70–99)
Potassium: 4.1 mmol/L (ref 3.5–5.1)
Sodium: 138 mmol/L (ref 135–145)
Total Bilirubin: 0.6 mg/dL (ref 0.0–1.2)
Total Protein: 7.1 g/dL (ref 6.5–8.1)
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
## 2024-04-04 ENCOUNTER — Ambulatory Visit: Payer: Self-pay | Admitting: Hematology and Oncology

## 2024-04-04 LAB — URINE CULTURE: Culture: 60000 — AB

## 2024-04-04 NOTE — Telephone Encounter (Signed)
 This RN called pt's primary MD per abnormal U/A and culture obtained 7/30. Of note pt informed MD at visit that she is asymptomatic.  Note pt has chronic abnormal urine test with pt coming in this office and due to having lab draw will leave a urine sample often.  Per call to pt's primary MD at [facility] was requested to fax above labs to them for follow up.  Labs faxed to given number with confirmed receipt.

## 2024-04-10 ENCOUNTER — Inpatient Hospital Stay: Admitting: Adult Health

## 2024-04-10 ENCOUNTER — Inpatient Hospital Stay: Attending: Hematology and Oncology

## 2024-04-10 DIAGNOSIS — Z8719 Personal history of other diseases of the digestive system: Secondary | ICD-10-CM | POA: Insufficient documentation

## 2024-04-10 DIAGNOSIS — R630 Anorexia: Secondary | ICD-10-CM | POA: Insufficient documentation

## 2024-04-10 DIAGNOSIS — F17210 Nicotine dependence, cigarettes, uncomplicated: Secondary | ICD-10-CM | POA: Insufficient documentation

## 2024-04-10 DIAGNOSIS — R634 Abnormal weight loss: Secondary | ICD-10-CM | POA: Insufficient documentation

## 2024-04-10 DIAGNOSIS — G629 Polyneuropathy, unspecified: Secondary | ICD-10-CM | POA: Insufficient documentation

## 2024-04-10 DIAGNOSIS — D539 Nutritional anemia, unspecified: Secondary | ICD-10-CM | POA: Insufficient documentation

## 2024-04-10 DIAGNOSIS — F1090 Alcohol use, unspecified, uncomplicated: Secondary | ICD-10-CM | POA: Insufficient documentation

## 2024-04-23 ENCOUNTER — Inpatient Hospital Stay (HOSPITAL_BASED_OUTPATIENT_CLINIC_OR_DEPARTMENT_OTHER)

## 2024-04-23 ENCOUNTER — Inpatient Hospital Stay: Admitting: Hematology and Oncology

## 2024-04-23 VITALS — BP 121/71 | HR 101 | Temp 97.6°F | Resp 14 | Ht 71.0 in | Wt 142.8 lb

## 2024-04-23 DIAGNOSIS — R630 Anorexia: Secondary | ICD-10-CM | POA: Diagnosis not present

## 2024-04-23 DIAGNOSIS — Z8719 Personal history of other diseases of the digestive system: Secondary | ICD-10-CM | POA: Diagnosis not present

## 2024-04-23 DIAGNOSIS — D539 Nutritional anemia, unspecified: Secondary | ICD-10-CM | POA: Diagnosis present

## 2024-04-23 DIAGNOSIS — G622 Polyneuropathy due to other toxic agents: Secondary | ICD-10-CM | POA: Diagnosis not present

## 2024-04-23 DIAGNOSIS — D631 Anemia in chronic kidney disease: Secondary | ICD-10-CM

## 2024-04-23 DIAGNOSIS — D638 Anemia in other chronic diseases classified elsewhere: Secondary | ICD-10-CM

## 2024-04-23 DIAGNOSIS — N189 Chronic kidney disease, unspecified: Secondary | ICD-10-CM | POA: Diagnosis not present

## 2024-04-23 DIAGNOSIS — R634 Abnormal weight loss: Secondary | ICD-10-CM

## 2024-04-23 DIAGNOSIS — G629 Polyneuropathy, unspecified: Secondary | ICD-10-CM | POA: Diagnosis not present

## 2024-04-23 DIAGNOSIS — D649 Anemia, unspecified: Secondary | ICD-10-CM

## 2024-04-23 DIAGNOSIS — F1090 Alcohol use, unspecified, uncomplicated: Secondary | ICD-10-CM | POA: Diagnosis not present

## 2024-04-23 DIAGNOSIS — F17210 Nicotine dependence, cigarettes, uncomplicated: Secondary | ICD-10-CM | POA: Diagnosis not present

## 2024-04-23 LAB — CBC WITH DIFFERENTIAL/PLATELET
Abs Immature Granulocytes: 0.07 K/uL (ref 0.00–0.07)
Basophils Absolute: 0 K/uL (ref 0.0–0.1)
Basophils Relative: 0 %
Eosinophils Absolute: 0.1 K/uL (ref 0.0–0.5)
Eosinophils Relative: 1 %
HCT: 22.4 % — ABNORMAL LOW (ref 36.0–46.0)
Hemoglobin: 7.4 g/dL — ABNORMAL LOW (ref 12.0–15.0)
Immature Granulocytes: 1 %
Lymphocytes Relative: 18 %
Lymphs Abs: 1.7 K/uL (ref 0.7–4.0)
MCH: 30.8 pg (ref 26.0–34.0)
MCHC: 33 g/dL (ref 30.0–36.0)
MCV: 93.3 fL (ref 80.0–100.0)
Monocytes Absolute: 0.4 K/uL (ref 0.1–1.0)
Monocytes Relative: 4 %
Neutro Abs: 7.1 K/uL (ref 1.7–7.7)
Neutrophils Relative %: 76 %
Platelets: 292 K/uL (ref 150–400)
RBC: 2.4 MIL/uL — ABNORMAL LOW (ref 3.87–5.11)
RDW: 15.9 % — ABNORMAL HIGH (ref 11.5–15.5)
WBC: 9.4 K/uL (ref 4.0–10.5)
nRBC: 0 % (ref 0.0–0.2)
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
## 2024-04-25 ENCOUNTER — Other Ambulatory Visit: Payer: Self-pay

## 2024-04-25 DIAGNOSIS — D638 Anemia in other chronic diseases classified elsewhere: Secondary | ICD-10-CM

## 2024-04-25 MED ORDER — DARBEPOETIN ALFA 100 MCG/0.5ML IJ SOSY: 100.0000 ug | PREFILLED_SYRINGE | INTRAMUSCULAR | 0 refills | Status: DC

## 2024-04-25 NOTE — Telephone Encounter (Signed)
 Order entered for Darbopoetin per MD to [facility]. Urgent referral and all supporting documents faxed to [facility] with fax confirmation received.

## 2024-05-02 ENCOUNTER — Inpatient Hospital Stay

## 2024-05-06 ENCOUNTER — Other Ambulatory Visit: Payer: Self-pay | Admitting: Radiology

## 2024-05-06 ENCOUNTER — Ambulatory Visit

## 2024-05-06 VITALS — BP 120/82 | Temp 98.7°F | Resp 16 | Ht 71.0 in | Wt 148.0 lb

## 2024-05-06 DIAGNOSIS — D631 Anemia in chronic kidney disease: Secondary | ICD-10-CM | POA: Diagnosis not present

## 2024-05-06 DIAGNOSIS — D638 Anemia in other chronic diseases classified elsewhere: Secondary | ICD-10-CM

## 2024-05-06 DIAGNOSIS — F1090 Alcohol use, unspecified, uncomplicated: Secondary | ICD-10-CM | POA: Diagnosis not present

## 2024-05-06 DIAGNOSIS — Z7689 Persons encountering health services in other specified circumstances: Secondary | ICD-10-CM

## 2024-05-06 DIAGNOSIS — N1832 Chronic kidney disease, stage 3b: Secondary | ICD-10-CM

## 2024-05-06 LAB — POCT URINALYSIS DIP (CLINITEK)
Blood, UA: NEGATIVE
Glucose, UA: NEGATIVE mg/dL
Leukocytes, UA: NEGATIVE
Nitrite, UA: NEGATIVE
POC PROTEIN,UA: 100 — AB
Spec Grav, UA: 1.015 (ref 1.010–1.025)
Urobilinogen, UA: 0.2 U/dL
pH, UA: 5.5 (ref 5.0–8.0)
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
## 2024-05-06 NOTE — Progress Notes (Unsigned)
 Patient is here to established care with provider. ~health hx address ~care gaps address
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
## 2024-05-08 ENCOUNTER — Ambulatory Visit (HOSPITAL_COMMUNITY)
Admission: RE | Admit: 2024-05-08 | Discharge: 2024-05-08 | Disposition: A | Discharge status: Home / Self Care | Source: Ambulatory Visit | Attending: Hematology and Oncology | Admitting: Hematology and Oncology

## 2024-05-08 ENCOUNTER — Other Ambulatory Visit: Payer: Self-pay

## 2024-05-08 ENCOUNTER — Telehealth: Payer: Self-pay

## 2024-05-08 ENCOUNTER — Encounter (HOSPITAL_COMMUNITY): Payer: Self-pay

## 2024-05-08 ENCOUNTER — Other Ambulatory Visit: Payer: Self-pay | Admitting: Hematology and Oncology

## 2024-05-08 DIAGNOSIS — Z79899 Other long term (current) drug therapy: Secondary | ICD-10-CM | POA: Diagnosis not present

## 2024-05-08 DIAGNOSIS — I129 Hypertensive chronic kidney disease with stage 1 through stage 4 chronic kidney disease, or unspecified chronic kidney disease: Secondary | ICD-10-CM | POA: Diagnosis not present

## 2024-05-08 DIAGNOSIS — D638 Anemia in other chronic diseases classified elsewhere: Secondary | ICD-10-CM

## 2024-05-08 DIAGNOSIS — D631 Anemia in chronic kidney disease: Secondary | ICD-10-CM | POA: Insufficient documentation

## 2024-05-08 DIAGNOSIS — E538 Deficiency of other specified B group vitamins: Secondary | ICD-10-CM | POA: Diagnosis not present

## 2024-05-08 DIAGNOSIS — F1020 Alcohol dependence, uncomplicated: Secondary | ICD-10-CM | POA: Diagnosis not present

## 2024-05-08 DIAGNOSIS — F17210 Nicotine dependence, cigarettes, uncomplicated: Secondary | ICD-10-CM | POA: Diagnosis not present

## 2024-05-08 DIAGNOSIS — J45909 Unspecified asthma, uncomplicated: Secondary | ICD-10-CM | POA: Insufficient documentation

## 2024-05-08 DIAGNOSIS — G629 Polyneuropathy, unspecified: Secondary | ICD-10-CM | POA: Diagnosis not present

## 2024-05-08 DIAGNOSIS — Z7982 Long term (current) use of aspirin: Secondary | ICD-10-CM | POA: Diagnosis not present

## 2024-05-08 DIAGNOSIS — R634 Abnormal weight loss: Secondary | ICD-10-CM | POA: Diagnosis not present

## 2024-05-08 DIAGNOSIS — N189 Chronic kidney disease, unspecified: Secondary | ICD-10-CM | POA: Insufficient documentation

## 2024-05-08 DIAGNOSIS — D539 Nutritional anemia, unspecified: Secondary | ICD-10-CM | POA: Diagnosis not present

## 2024-05-08 LAB — CBC WITH DIFFERENTIAL/PLATELET
Abs Immature Granulocytes: 0.01 K/uL (ref 0.00–0.07)
Basophils Absolute: 0 K/uL (ref 0.0–0.1)
Basophils Relative: 1 %
Eosinophils Absolute: 0.1 K/uL (ref 0.0–0.5)
Eosinophils Relative: 2 %
HCT: 22.5 % — ABNORMAL LOW (ref 36.0–46.0)
Hemoglobin: 7.1 g/dL — ABNORMAL LOW (ref 12.0–15.0)
Immature Granulocytes: 0 %
Lymphocytes Relative: 18 %
Lymphs Abs: 1.1 K/uL (ref 0.7–4.0)
MCH: 30.7 pg (ref 26.0–34.0)
MCHC: 31.6 g/dL (ref 30.0–36.0)
MCV: 97.4 fL (ref 80.0–100.0)
Monocytes Absolute: 0.4 K/uL (ref 0.1–1.0)
Monocytes Relative: 6 %
Neutro Abs: 4.7 K/uL (ref 1.7–7.7)
Neutrophils Relative %: 73 %
Platelets: 214 K/uL (ref 150–400)
RBC: 2.31 MIL/uL — ABNORMAL LOW (ref 3.87–5.11)
RDW: 18 % — ABNORMAL HIGH (ref 11.5–15.5)
WBC: 6.3 K/uL (ref 4.0–10.5)
nRBC: 0 % (ref 0.0–0.2)

## 2024-05-08 MED ORDER — FENTANYL CITRATE (PF) 100 MCG/2ML IJ SOLN
INTRAMUSCULAR | Status: AC | PRN
  Administered 2024-05-08: 50 ug via INTRAVENOUS

## 2024-05-08 MED ORDER — MIDAZOLAM HCL 2 MG/2ML IJ SOLN
INTRAMUSCULAR | Status: AC
  Filled 2024-05-08: qty 2

## 2024-05-08 MED ORDER — MIDAZOLAM HCL 2 MG/2ML IJ SOLN
INTRAMUSCULAR | Status: AC | PRN
  Administered 2024-05-08 (×2): 1 mg via INTRAVENOUS

## 2024-05-08 MED ORDER — SODIUM CHLORIDE 0.9 % IV SOLN: INTRAVENOUS | Status: DC

## 2024-05-08 MED ORDER — FENTANYL CITRATE (PF) 100 MCG/2ML IJ SOLN
INTRAMUSCULAR | Status: AC
Start: 2024-05-08 — End: 2024-05-08
  Filled 2024-05-08: qty 2

## 2024-05-08 NOTE — Procedures (Signed)
Interventional Radiology Procedure:   Indications: Macrocytic anemia  Procedure: CT guided bone marrow biopsy  Findings: 2 aspirates and 1 core from right ilium  Complications: None     EBL: Minimal, less than 10 ml  Plan: Discharge to home in one hour.   Gwenna Fuston R. Doraine Schexnider, MD  Pager: [phone]    

## 2024-05-08 NOTE — Sedation Documentation (Signed)
 RN Christoffer Currier pulled 2 mg Versed  and 100 mcg Fentanyl  in IR room. Pt. Received 2 mg Versed  and 100 mcg Fentanyl  throughout the procedure.

## 2024-05-08 NOTE — Telephone Encounter (Signed)
 error

## 2024-05-08 NOTE — Telephone Encounter (Signed)
-----   Message from Nurse Leita B sent at 05/08/2024  2:56 PM EDT ----- Regarding: RE: eligibility Please disregard. Dr Loretha will you remove supportive? Patient's insurance lapsed because the credit card she had on file was compromised. This has been resolved and she can get her darbo at home.  Thanks. LB ----- Message ----- From: Delores Leita MATSU, LPN Sent: 0/01/7973   1:24 PM EDT To: Olam MARLA Pinal; Amber Loretha, MD; [facility] Subject: eligibility                                    Erskin Olam,  We have ordered Darbopoetin for this patient and she gets it at [facility].  She hasn't received her medication so I called [facility] and they said her tricare eligibility for medical/pharmacy benefits has ended ? She did say that she also has [employer] medicaid. Could we please run that for auth on her darbo? In the meantime, she is supposed to be contacting [employer] to find out what is going on with her tricare eligibility.   Thanks, Leita

## 2024-05-09 ENCOUNTER — Other Ambulatory Visit: Payer: Self-pay | Admitting: Hematology and Oncology

## 2024-05-09 ENCOUNTER — Inpatient Hospital Stay: Attending: Hematology and Oncology | Admitting: Hematology and Oncology

## 2024-05-09 DIAGNOSIS — Z8719 Personal history of other diseases of the digestive system: Secondary | ICD-10-CM | POA: Diagnosis not present

## 2024-05-09 DIAGNOSIS — G629 Polyneuropathy, unspecified: Secondary | ICD-10-CM | POA: Diagnosis not present

## 2024-05-09 DIAGNOSIS — D638 Anemia in other chronic diseases classified elsewhere: Secondary | ICD-10-CM

## 2024-05-09 DIAGNOSIS — D649 Anemia, unspecified: Secondary | ICD-10-CM | POA: Insufficient documentation
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
## 2024-05-12 LAB — SURGICAL PATHOLOGY

## 2024-05-12 LAB — RETICULOCYTES: Retic Ct Pct: 3.5 % — ABNORMAL HIGH (ref 0.6–2.6)

## 2024-05-13 ENCOUNTER — Ambulatory Visit: Payer: Self-pay | Admitting: Hematology and Oncology

## 2024-05-14 LAB — CBC WITH DIFFERENTIAL/PLATELET

## 2024-05-14 LAB — SPECIMEN STATUS REPORT

## 2024-05-14 LAB — VITAMIN B12: Vitamin B-12: >2000 pg/mL — ABNORMAL HIGH (ref 232–1245)

## 2024-05-14 LAB — IRON,TIBC AND FERRITIN PANEL
Ferritin: 611 ng/mL — ABNORMAL HIGH (ref 15–150)
Iron Saturation: 44 % (ref 15–55)
Iron: 93 ug/dL (ref 27–139)
Total Iron Binding Capacity: 213 ug/dL — ABNORMAL LOW (ref 250–450)
UIBC: 120 ug/dL (ref 118–369)

## 2024-05-14 LAB — FOLATE: Folate: 5.7 ng/mL (ref >3.0)

## 2024-05-14 NOTE — Progress Notes (Signed)
 Email sent to [employer] and [employer] with [employer] path.

## 2024-05-19 ENCOUNTER — Encounter (HOSPITAL_COMMUNITY): Payer: Self-pay | Admitting: Hematology and Oncology

## 2024-05-21 ENCOUNTER — Telehealth: Payer: Self-pay

## 2024-05-21 ENCOUNTER — Encounter: Payer: Self-pay | Admitting: Hematology and Oncology

## 2024-05-21 ENCOUNTER — Other Ambulatory Visit: Payer: Self-pay | Admitting: *Deleted

## 2024-05-21 DIAGNOSIS — Z122 Encounter for screening for malignant neoplasm of respiratory organs: Secondary | ICD-10-CM

## 2024-05-21 DIAGNOSIS — F17210 Nicotine dependence, cigarettes, uncomplicated: Secondary | ICD-10-CM

## 2024-05-21 DIAGNOSIS — Z87891 Personal history of nicotine dependence: Secondary | ICD-10-CM

## 2024-05-21 NOTE — Telephone Encounter (Signed)
-----   Message from Nurse Leita B sent at 05/21/2024 10:23 AM EDT ----- Pt has been scheduled for 9/22 at 1245. She is aware and agreeable. ----- Message ----- From: Vita Rudean CROME, LPN Sent: [id number]   9:47 AM EDT To: Leita KANDICE Daring, LPN   ----- Message ----- From: Loretha Ash, MD Sent: 05/21/2024   7:38 AM EDT To: Rudean CROME Vita, LPN; Ethridge CROME Shona Nanetta,  Next available is good ----- Message ----- From: Vita Rudean CROME, LPN Sent: 0/87/7974   4:28 PM EDT To: Ash Loretha, MD  Pt is calling wanting to set up appt to go over recent labs and path. Please advise next week

## 2024-05-26 ENCOUNTER — Other Ambulatory Visit: Payer: Self-pay

## 2024-05-26 ENCOUNTER — Inpatient Hospital Stay

## 2024-05-26 ENCOUNTER — Inpatient Hospital Stay (HOSPITAL_BASED_OUTPATIENT_CLINIC_OR_DEPARTMENT_OTHER): Admitting: Hematology and Oncology

## 2024-05-26 VITALS — BP 161/96 | HR 89 | Temp 99.0°F | Resp 16 | Wt 142.0 lb

## 2024-05-26 DIAGNOSIS — D638 Anemia in other chronic diseases classified elsewhere: Secondary | ICD-10-CM

## 2024-05-26 DIAGNOSIS — D649 Anemia, unspecified: Secondary | ICD-10-CM | POA: Diagnosis present

## 2024-05-26 DIAGNOSIS — D539 Nutritional anemia, unspecified: Secondary | ICD-10-CM

## 2024-05-26 LAB — CBC WITH DIFFERENTIAL/PLATELET
Abs Immature Granulocytes: 0.02 K/uL (ref 0.00–0.07)
Basophils Absolute: 0 K/uL (ref 0.0–0.1)
Basophils Relative: 1 %
Eosinophils Absolute: 0.1 K/uL (ref 0.0–0.5)
Eosinophils Relative: 1 %
HCT: 23.3 % — ABNORMAL LOW (ref 36.0–46.0)
Hemoglobin: 7.5 g/dL — ABNORMAL LOW (ref 12.0–15.0)
Immature Granulocytes: 0 %
Lymphocytes Relative: 23 %
Lymphs Abs: 1.7 K/uL (ref 0.7–4.0)
MCH: 30.1 pg (ref 26.0–34.0)
MCHC: 32.2 g/dL (ref 30.0–36.0)
MCV: 93.6 fL (ref 80.0–100.0)
Monocytes Absolute: 0.3 K/uL (ref 0.1–1.0)
Monocytes Relative: 4 %
Neutro Abs: 5.1 K/uL (ref 1.7–7.7)
Neutrophils Relative %: 71 %
Platelets: 282 K/uL (ref 150–400)
RBC: 2.49 MIL/uL — ABNORMAL LOW (ref 3.87–5.11)
RDW: 16 % — ABNORMAL HIGH (ref 11.5–15.5)
WBC: 7.2 K/uL (ref 4.0–10.5)
nRBC: 0 % (ref 0.0–0.2)
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
## 2024-05-27 ENCOUNTER — Inpatient Hospital Stay: Admitting: Licensed Clinical Social Worker

## 2024-05-27 NOTE — Progress Notes (Signed)
 [facility] Clinical Social Work  Clinical Social Work was referred by medical provider for referral for counseling.  Clinical Social Worker contacted patient by phone to offer support and assess for needs.    Patient is interested specifically in grief counseling. Briefly discussed options and pt selected an agency  Interventions: Referred patient to [facility] for grief counseling. Provided their contact information to pt for follow-up        Amias Hutchinson E Taveon Enyeart, LCSW  Clinical Social Worker  Cancer Center        Patient is participating in a Managed Medicaid Plan:  Yes

## 2024-06-02 ENCOUNTER — Encounter (HOSPITAL_COMMUNITY): Payer: Self-pay | Admitting: Hematology and Oncology

## 2024-06-03 ENCOUNTER — Ambulatory Visit
Admission: RE | Admit: 2024-06-03 | Discharge: 2024-06-03 | Disposition: A | Discharge status: Home / Self Care | Source: Ambulatory Visit | Attending: Acute Care | Admitting: Acute Care

## 2024-06-03 DIAGNOSIS — F17210 Nicotine dependence, cigarettes, uncomplicated: Secondary | ICD-10-CM

## 2024-06-03 DIAGNOSIS — Z87891 Personal history of nicotine dependence: Secondary | ICD-10-CM

## 2024-06-03 DIAGNOSIS — Z122 Encounter for screening for malignant neoplasm of respiratory organs: Secondary | ICD-10-CM

## 2024-06-04 ENCOUNTER — Telehealth: Payer: Self-pay

## 2024-06-04 NOTE — Telephone Encounter (Signed)
 Received call from pt stating she had an appt 9/29 but thought it was at [facility]. When she called our office to inquire about it, she was told she does not have an appt. The appt was with [facility], where we referred her. Also received fax from [facility] indicating that pt was no show.   I called the pt back and I explained to her thoroughly what the appt is for, and the importance of the appt. She was provided the number to [facility] to call back and r/s. Fax was also sent back to [facility] to let them know the pt was confused about her appt and would need to r/s.

## 2024-06-09 ENCOUNTER — Other Ambulatory Visit: Payer: Self-pay

## 2024-06-09 DIAGNOSIS — Z122 Encounter for screening for malignant neoplasm of respiratory organs: Secondary | ICD-10-CM

## 2024-06-09 DIAGNOSIS — F17210 Nicotine dependence, cigarettes, uncomplicated: Secondary | ICD-10-CM

## 2024-06-09 DIAGNOSIS — Z87891 Personal history of nicotine dependence: Secondary | ICD-10-CM

## 2024-06-17 ENCOUNTER — Encounter: Payer: Self-pay | Admitting: Family Medicine

## 2024-06-17 ENCOUNTER — Ambulatory Visit: Admitting: Family Medicine

## 2024-06-17 VITALS — BP 157/93 | HR 78 | Ht 71.0 in | Wt 144.8 lb

## 2024-06-17 DIAGNOSIS — F329 Major depressive disorder, single episode, unspecified: Secondary | ICD-10-CM | POA: Diagnosis not present

## 2024-06-17 DIAGNOSIS — R200 Anesthesia of skin: Secondary | ICD-10-CM

## 2024-06-17 DIAGNOSIS — M25571 Pain in right ankle and joints of right foot: Secondary | ICD-10-CM | POA: Diagnosis not present

## 2024-06-17 DIAGNOSIS — R202 Paresthesia of skin: Secondary | ICD-10-CM

## 2024-06-17 MED ORDER — MIRTAZAPINE 15 MG PO TBDP: 15.0000 mg | ORAL_TABLET | Freq: Every day | ORAL | 0 refills | Status: DC
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
## 2024-06-18 ENCOUNTER — Encounter: Payer: Self-pay | Admitting: Family Medicine

## 2024-06-18 ENCOUNTER — Encounter: Payer: Self-pay | Admitting: Hematology and Oncology

## 2024-06-23 ENCOUNTER — Telehealth: Payer: Self-pay | Admitting: *Deleted

## 2024-06-23 ENCOUNTER — Other Ambulatory Visit: Payer: Self-pay | Admitting: *Deleted

## 2024-06-23 DIAGNOSIS — R309 Painful micturition, unspecified: Secondary | ICD-10-CM

## 2024-06-23 NOTE — Telephone Encounter (Signed)
 Patient called stating she is having recurring UTI symptoms.  She asked for antibiotic.  We do not have any lab specimens to confirm.  This appears to be a recurring issue for the patient.     Scheduled lab for UA and Culture as each time she has had something different grow.

## 2024-06-24 ENCOUNTER — Inpatient Hospital Stay: Attending: Hematology and Oncology

## 2024-06-24 ENCOUNTER — Inpatient Hospital Stay

## 2024-06-24 DIAGNOSIS — R309 Painful micturition, unspecified: Secondary | ICD-10-CM

## 2024-06-24 DIAGNOSIS — D638 Anemia in other chronic diseases classified elsewhere: Secondary | ICD-10-CM

## 2024-06-24 DIAGNOSIS — D539 Nutritional anemia, unspecified: Secondary | ICD-10-CM | POA: Diagnosis present

## 2024-06-24 LAB — CBC WITH DIFFERENTIAL/PLATELET
Abs Immature Granulocytes: 0.04 K/uL (ref 0.00–0.07)
Basophils Absolute: 0 K/uL (ref 0.0–0.1)
Basophils Relative: 0 %
Eosinophils Absolute: 0.1 K/uL (ref 0.0–0.5)
Eosinophils Relative: 1 %
HCT: 27.9 % — ABNORMAL LOW (ref 36.0–46.0)
Hemoglobin: 8.8 g/dL — ABNORMAL LOW (ref 12.0–15.0)
Immature Granulocytes: 1 %
Lymphocytes Relative: 23 %
Lymphs Abs: 1.9 K/uL (ref 0.7–4.0)
MCH: 30.1 pg (ref 26.0–34.0)
MCHC: 31.5 g/dL (ref 30.0–36.0)
MCV: 95.5 fL (ref 80.0–100.0)
Monocytes Absolute: 0.3 K/uL (ref 0.1–1.0)
Monocytes Relative: 4 %
Neutro Abs: 5.9 K/uL (ref 1.7–7.7)
Neutrophils Relative %: 71 %
Platelets: 218 K/uL (ref 150–400)
RBC: 2.92 MIL/uL — ABNORMAL LOW (ref 3.87–5.11)
RDW: 17.2 % — ABNORMAL HIGH (ref 11.5–15.5)
WBC: 8.3 K/uL (ref 4.0–10.5)
nRBC: 0 % (ref 0.0–0.2)

## 2024-06-24 LAB — URINALYSIS, COMPLETE (UACMP) WITH MICROSCOPIC
Bacteria, UA: NONE SEEN
Bilirubin Urine: NEGATIVE
Glucose, UA: NEGATIVE mg/dL
Hgb urine dipstick: NEGATIVE
Ketones, ur: NEGATIVE mg/dL
Nitrite: NEGATIVE
Protein, ur: 30 mg/dL — AB
Specific Gravity, Urine: 1.009 (ref 1.005–1.030)
pH: 5 (ref 5.0–8.0)

## 2024-06-25 ENCOUNTER — Encounter: Payer: Self-pay | Admitting: Hematology and Oncology

## 2024-06-25 ENCOUNTER — Telehealth: Payer: Self-pay

## 2024-06-26 ENCOUNTER — Encounter: Payer: Self-pay | Admitting: Hematology and Oncology

## 2024-06-26 ENCOUNTER — Other Ambulatory Visit: Payer: Self-pay

## 2024-06-26 LAB — URINE CULTURE: Culture: 40000 — AB

## 2024-06-26 MED ORDER — CIPROFLOXACIN HCL 500 MG PO TABS: 500.0000 mg | ORAL_TABLET | Freq: Two times a day (BID) | ORAL | 0 refills | Status: AC

## 2024-06-26 NOTE — Progress Notes (Signed)
 Pt called to obtain UA-C results and to let us  know she is still experiencing burning at the end of her urine stream.  Per Dr Odean, order placed for Cipro  500 mg PO BID x 7 days for UTI E. Coli. Pt was educated on appropriate peri-care and encouraged to avoid thong underwear as she states this is what she prefers. She knows to call should she not have relief in a few days from cipro .

## 2024-06-26 NOTE — Telephone Encounter (Signed)
See order encounter for note

## 2024-07-01 ENCOUNTER — Telehealth: Payer: Self-pay

## 2024-07-01 NOTE — Telephone Encounter (Signed)
 Copied from CRM [id number]. Topic: Referral - Status >> Jun 30, 2024  2:40 PM [name] wrote: Reason for CRM: pt called to check on the staus of referral to foot dr. Rosita advise.

## 2024-07-01 NOTE — Telephone Encounter (Signed)
 Copied from CRM [id number]. Topic: Clinical - Prescription Issue >> Jun 30, 2024  2:42 PM [name] wrote: Reason for CRM: mirtazapine (REMERON SOL-TAB) 15 MG disintegrating tablet this medication is not working. It worked the first day but that's it.

## 2024-07-07 NOTE — Telephone Encounter (Signed)
 Spoke to pt OTP. Gave her referral info

## 2024-07-09 ENCOUNTER — Ambulatory Visit (INDEPENDENT_AMBULATORY_CARE_PROVIDER_SITE_OTHER)

## 2024-07-09 ENCOUNTER — Encounter: Payer: Self-pay | Admitting: Podiatry

## 2024-07-09 ENCOUNTER — Ambulatory Visit (INDEPENDENT_AMBULATORY_CARE_PROVIDER_SITE_OTHER): Admitting: Podiatry

## 2024-07-09 VITALS — Ht 71.0 in | Wt 144.8 lb

## 2024-07-09 DIAGNOSIS — M7752 Other enthesopathy of left foot: Secondary | ICD-10-CM

## 2024-07-09 DIAGNOSIS — M7751 Other enthesopathy of right foot: Secondary | ICD-10-CM

## 2024-07-09 MED ORDER — METHYLPREDNISOLONE 4 MG PO TBPK: ORAL_TABLET | ORAL | 0 refills | Status: DC

## 2024-07-09 MED ORDER — GABAPENTIN 100 MG PO CAPS: 100.0000 mg | ORAL_CAPSULE | Freq: Three times a day (TID) | ORAL | 3 refills | Status: DC
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
## 2024-07-14 ENCOUNTER — Other Ambulatory Visit: Payer: Self-pay | Admitting: Family Medicine

## 2024-07-21 ENCOUNTER — Other Ambulatory Visit: Payer: Self-pay | Admitting: Family Medicine

## 2024-07-21 NOTE — Telephone Encounter (Unsigned)
 Copied from CRM #[id number]. Topic: Clinical - Medication Refill >> Jul 21, 2024 11:35 AM Antony RAMAN wrote: Medication: mirtazapine (REMERON SOL-TAB) 15 MG disintegrating tablet  Has the patient contacted their pharmacy? Yes (Agent: If no, request that the patient contact the pharmacy for the refill. If patient does not wish to contact the pharmacy document the reason why and proceed with request.) (Agent: If yes, when and what did the pharmacy advise?)  This is the patient's preferred pharmacy:  [facility] [id number] - [place], [place] - [address] [place] [address] Phone: [phone] Fax: [phone]   Is this the correct pharmacy for this prescription? Yes If no, delete pharmacy and type the correct one.   Has the prescription been filled recently? No  Is the patient out of the medication? Yes  Has the patient been seen for an appointment in the last year OR does the patient have an upcoming appointment? Yes  Can we respond through MyChart? Yes  Agent: Please be advised that Rx refills may take up to 3 business days. We ask that you follow-up with your pharmacy.

## 2024-07-23 ENCOUNTER — Ambulatory Visit (INDEPENDENT_AMBULATORY_CARE_PROVIDER_SITE_OTHER): Admitting: *Deleted

## 2024-07-23 DIAGNOSIS — Z23 Encounter for immunization: Secondary | ICD-10-CM | POA: Diagnosis not present

## 2024-07-23 MED ORDER — MIRTAZAPINE 15 MG PO TBDP: 15.0000 mg | ORAL_TABLET | Freq: Every day | ORAL | 0 refills | Status: AC

## 2024-07-23 NOTE — Telephone Encounter (Signed)
 Requested Prescriptions  Pending Prescriptions Disp Refills   mirtazapine (REMERON SOL-TAB) 15 MG disintegrating tablet 90 tablet 0    Sig: Take 1 tablet (15 mg total) by mouth at bedtime.     Psychiatry: Antidepressants - mirtazapine Passed - 07/23/2024  3:58 PM      Passed - Completed PHQ-2 or PHQ-9 in the last 360 days      Passed - Valid encounter within last 6 months    Recent Outpatient Visits           1 month ago Reactive depression (situational)   [facility], MD   2 months ago Encounter to establish care with new provider   [facility] Leavy Rode, [place], PA-C

## 2024-07-23 NOTE — Telephone Encounter (Signed)
 Duplicate request.  Requested Prescriptions  Pending Prescriptions Disp Refills   mirtazapine (REMERON SOL-TAB) 15 MG disintegrating tablet 30 tablet 0    Sig: Take 1 tablet (15 mg total) by mouth at bedtime.     Psychiatry: Antidepressants - mirtazapine Passed - 07/23/2024  4:01 PM      Passed - Completed PHQ-2 or PHQ-9 in the last 360 days      Passed - Valid encounter within last 6 months    Recent Outpatient Visits           1 month ago Reactive depression (situational)   [place] [facility], MD   2 months ago Encounter to establish care with new provider   [facility] Leavy Rode, [place], PA-C

## 2024-07-29 ENCOUNTER — Other Ambulatory Visit: Payer: Self-pay | Admitting: Gastroenterology

## 2024-07-29 DIAGNOSIS — D649 Anemia, unspecified: Secondary | ICD-10-CM

## 2024-07-29 DIAGNOSIS — F1010 Alcohol abuse, uncomplicated: Secondary | ICD-10-CM

## 2024-08-05 ENCOUNTER — Encounter: Payer: Self-pay | Admitting: Family Medicine

## 2024-08-05 ENCOUNTER — Ambulatory Visit: Admitting: Family Medicine

## 2024-08-05 VITALS — BP 125/84 | HR 99 | Ht 71.0 in | Wt 150.6 lb

## 2024-08-05 DIAGNOSIS — G4700 Insomnia, unspecified: Secondary | ICD-10-CM | POA: Diagnosis not present

## 2024-08-05 DIAGNOSIS — Z1231 Encounter for screening mammogram for malignant neoplasm of breast: Secondary | ICD-10-CM

## 2024-08-05 DIAGNOSIS — N1832 Chronic kidney disease, stage 3b: Secondary | ICD-10-CM

## 2024-08-05 DIAGNOSIS — F329 Major depressive disorder, single episode, unspecified: Secondary | ICD-10-CM | POA: Diagnosis not present

## 2024-08-05 DIAGNOSIS — D631 Anemia in chronic kidney disease: Secondary | ICD-10-CM

## 2024-08-05 DIAGNOSIS — F1090 Alcohol use, unspecified, uncomplicated: Secondary | ICD-10-CM | POA: Diagnosis not present

## 2024-08-05 MED ORDER — MIRTAZAPINE 30 MG PO TABS: 30.0000 mg | ORAL_TABLET | Freq: Every day | ORAL | 0 refills | Status: DC
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
## 2024-08-11 ENCOUNTER — Encounter: Payer: Self-pay | Admitting: Hematology and Oncology

## 2024-08-11 ENCOUNTER — Ambulatory Visit
Admission: RE | Admit: 2024-08-11 | Discharge: 2024-08-11 | Disposition: A | Source: Ambulatory Visit | Attending: Gastroenterology

## 2024-08-11 DIAGNOSIS — F1010 Alcohol abuse, uncomplicated: Secondary | ICD-10-CM

## 2024-08-11 DIAGNOSIS — D649 Anemia, unspecified: Secondary | ICD-10-CM

## 2024-08-12 ENCOUNTER — Inpatient Hospital Stay (HOSPITAL_BASED_OUTPATIENT_CLINIC_OR_DEPARTMENT_OTHER): Admitting: Hematology and Oncology

## 2024-08-12 ENCOUNTER — Inpatient Hospital Stay: Attending: Hematology and Oncology

## 2024-08-12 VITALS — BP 155/99 | HR 103 | Temp 97.5°F | Resp 16 | Wt 154.5 lb

## 2024-08-12 DIAGNOSIS — N1832 Chronic kidney disease, stage 3b: Secondary | ICD-10-CM | POA: Diagnosis not present

## 2024-08-12 DIAGNOSIS — D539 Nutritional anemia, unspecified: Secondary | ICD-10-CM | POA: Diagnosis present

## 2024-08-12 DIAGNOSIS — N189 Chronic kidney disease, unspecified: Secondary | ICD-10-CM | POA: Insufficient documentation

## 2024-08-12 DIAGNOSIS — D638 Anemia in other chronic diseases classified elsewhere: Secondary | ICD-10-CM | POA: Diagnosis not present

## 2024-08-12 DIAGNOSIS — F1020 Alcohol dependence, uncomplicated: Secondary | ICD-10-CM | POA: Diagnosis not present

## 2024-08-12 LAB — CBC WITH DIFFERENTIAL/PLATELET
Abs Immature Granulocytes: 0.02 K/uL (ref 0.00–0.07)
Basophils Absolute: 0 K/uL (ref 0.0–0.1)
Basophils Relative: 1 %
Eosinophils Absolute: 0.1 K/uL (ref 0.0–0.5)
Eosinophils Relative: 1 %
HCT: 35.9 % — ABNORMAL LOW (ref 36.0–46.0)
Hemoglobin: 11.7 g/dL — ABNORMAL LOW (ref 12.0–15.0)
Immature Granulocytes: 0 %
Lymphocytes Relative: 28 %
Lymphs Abs: 1.7 K/uL (ref 0.7–4.0)
MCH: 32 pg (ref 26.0–34.0)
MCHC: 32.6 g/dL (ref 30.0–36.0)
MCV: 98.1 fL (ref 80.0–100.0)
Monocytes Absolute: 0.4 K/uL (ref 0.1–1.0)
Monocytes Relative: 7 %
Neutro Abs: 4 K/uL (ref 1.7–7.7)
Neutrophils Relative %: 63 %
Platelets: 257 K/uL (ref 150–400)
RBC: 3.66 MIL/uL — ABNORMAL LOW (ref 3.87–5.11)
RDW: 15.3 % (ref 11.5–15.5)
WBC: 6.3 K/uL (ref 4.0–10.5)
nRBC: 0 % (ref 0.0–0.2)

## 2024-08-12 MED ORDER — DARBEPOETIN ALFA 100 MCG/0.5ML IJ SOSY: 100.0000 ug | PREFILLED_SYRINGE | INTRAMUSCULAR | 0 refills | Status: AC
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
## 2024-08-20 ENCOUNTER — Encounter: Payer: Self-pay | Admitting: Podiatry

## 2024-08-20 ENCOUNTER — Ambulatory Visit: Admitting: Podiatry

## 2024-08-20 VITALS — Ht 71.0 in | Wt 154.5 lb

## 2024-08-20 DIAGNOSIS — G609 Hereditary and idiopathic neuropathy, unspecified: Secondary | ICD-10-CM
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
## 2024-09-04 ENCOUNTER — Other Ambulatory Visit: Payer: Self-pay | Admitting: Family Medicine

## 2024-09-05 NOTE — Telephone Encounter (Signed)
 Complete

## 2024-09-09 ENCOUNTER — Inpatient Hospital Stay (HOSPITAL_BASED_OUTPATIENT_CLINIC_OR_DEPARTMENT_OTHER): Admitting: Adult Health

## 2024-09-09 ENCOUNTER — Inpatient Hospital Stay: Attending: Hematology and Oncology

## 2024-09-09 VITALS — BP 161/100 | HR 91 | Temp 98.2°F | Resp 17 | Wt 151.4 lb

## 2024-09-09 DIAGNOSIS — D638 Anemia in other chronic diseases classified elsewhere: Secondary | ICD-10-CM

## 2024-09-09 LAB — CBC WITH DIFFERENTIAL/PLATELET
Abs Immature Granulocytes: 0.02 K/uL (ref 0.00–0.07)
Basophils Absolute: 0 K/uL (ref 0.0–0.1)
Basophils Relative: 1 %
Eosinophils Absolute: 0.1 K/uL (ref 0.0–0.5)
Eosinophils Relative: 1 %
HCT: 33.9 % — ABNORMAL LOW (ref 36.0–46.0)
Hemoglobin: 11.3 g/dL — ABNORMAL LOW (ref 12.0–15.0)
Immature Granulocytes: 0 %
Lymphocytes Relative: 25 %
Lymphs Abs: 1.7 K/uL (ref 0.7–4.0)
MCH: 31.1 pg (ref 26.0–34.0)
MCHC: 33.3 g/dL (ref 30.0–36.0)
MCV: 93.4 fL (ref 80.0–100.0)
Monocytes Absolute: 0.3 K/uL (ref 0.1–1.0)
Monocytes Relative: 5 %
Neutro Abs: 4.7 K/uL (ref 1.7–7.7)
Neutrophils Relative %: 68 %
Platelets: 214 K/uL (ref 150–400)
RBC: 3.63 MIL/uL — ABNORMAL LOW (ref 3.87–5.11)
RDW: 12.9 % (ref 11.5–15.5)
WBC: 6.9 K/uL (ref 4.0–10.5)
nRBC: 0 % (ref 0.0–0.2)
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
## 2024-09-12 ENCOUNTER — Encounter: Payer: Self-pay | Admitting: Hematology and Oncology

## 2024-09-12 ENCOUNTER — Ambulatory Visit: Payer: Self-pay

## 2024-09-12 NOTE — Telephone Encounter (Signed)
 FYI Only or Action Required?: Action required by provider: clinical question for provider.  Patient was last seen in primary care on 08/05/2024 by [name], MD.  Called Nurse Triage reporting Anorexia.  Symptoms began several days ago.  Interventions attempted: Nothing.  Symptoms are: unchanged.  Triage Disposition: See Physician Within 24 Hours  Patient/caregiver understands and will follow disposition?: No, wishes to speak with PCP   Copied from CRM #[id number]. Topic: Clinical - Red Word Triage >> Sep 12, 2024 12:41 PM Pinkey ORN wrote: Red Word that prompted transfer to Nurse Triage: Severe Loss Of Appetite >> Sep 12, 2024 12:42 PM Pinkey ORN wrote: Patient states that she's experiencing severe loss of appetite. Patient states she hasn't eaten anything in the last 36 hours. Patient states she also has stomach ulcers that she believes maybe playing a part in her decreased appetite.  Reason for Disposition  [1] Depression AND [2] getting worse (e.g., sleeping poorly, less able to do activities of daily living)  Answer Assessment - Initial Assessment Questions Patient declines appts;in person/video. Patient requesting recommendations due to medication not helping with appetite or depression.  Advised call back or ED/911 if symptoms occur/worsen: severe diff breathing, chest pain > 5 min, faint, severe abd pain>1 hr, vomit blood/green bile, hard abdomen. Patient verbalized understanding.   Per pt, rescheduled f/u appt to 10/16/24 with Dr. Tanda.  1. CONCERN: What happened that made you call today?     loss of appeitite, have not really eaten 36 hours, not taking meds regularly, staying hydrated, mix alcohol with water  daily;alcoholism dx with ulcers yesterday aware of alcolohimm; ongoing for 1  year 2. DEPRESSION SYMPTOM SCREENING: How are you feeling overall? (e.g., decreased energy, increased sleeping or difficulty sleeping, difficulty concentrating, feelings of sadness,  guilt, hopelessness, or worthlessness)      3. RISK OF HARM - SUICIDAL IDEATION:  Do you ever have thoughts of hurting or killing yourself?  (e.g., yes, no, no but preoccupation with thoughts about death)     no 4. RISK OF HARM - HOMICIDAL IDEATION:  Do you ever have thoughts of hurting or killing someone else?  (e.g., yes, no, no but preoccupation with thoughts about death)     no 5. FUNCTIONAL IMPAIRMENT: How have things been going for you overall? Have you had more difficulty than usual doing your normal daily activities?  (e.g., better, same, worse; self-care, school, work, interactions)     Able to care for self and dog, go to appts, nothing else 6. SUPPORT: Who is with you now? Who do you live with? Do you have family or friends who you can talk to?      friends 7. THERAPIST: Do you have a counselor or therapist? If Yes, ask: What is their name?     no 8. STRESSORS: Has there been any new stress or recent changes in your life?     Death husband, 1 year ago 9. ALCOHOL USE OR SUBSTANCE USE (DRUG USE): Do you drink alcohol or use any illegal drugs?     Daily alcohol 10. OTHER: Do you have any other physical symptoms right now? (e.g., fever)       Denies diff breathing, chest pain, faint, abd pain, fever chills, n/v/d  Protocols used: Depression-A-AH

## 2024-09-17 NOTE — Telephone Encounter (Signed)
 Pt scheduled

## 2024-09-18 ENCOUNTER — Telehealth: Payer: Self-pay | Admitting: Lab

## 2024-09-18 NOTE — Telephone Encounter (Signed)
 Patient is requesting a referral for neuropathy please advise.

## 2024-09-22 ENCOUNTER — Ambulatory Visit (INDEPENDENT_AMBULATORY_CARE_PROVIDER_SITE_OTHER): Admitting: *Deleted

## 2024-09-22 DIAGNOSIS — Z23 Encounter for immunization: Secondary | ICD-10-CM

## 2024-09-22 NOTE — Progress Notes (Signed)
 Patient is in office today for a nurse visit for Immunization. Patient Injection was given in the  Left deltoid. Patient tolerated injection well.

## 2024-09-25 ENCOUNTER — Ambulatory Visit: Admitting: Family Medicine

## 2024-09-29 ENCOUNTER — Ambulatory Visit: Payer: Self-pay | Admitting: Family Medicine

## 2024-09-30 NOTE — Telephone Encounter (Signed)
 Patient calling again requesting a referral to a neurologist. Please call her back asap

## 2024-10-01 ENCOUNTER — Other Ambulatory Visit: Payer: Self-pay | Admitting: Lab

## 2024-10-01 DIAGNOSIS — G609 Hereditary and idiopathic neuropathy, unspecified: Secondary | ICD-10-CM

## 2024-10-01 NOTE — Telephone Encounter (Signed)
 Referral initiated patient informed.

## 2024-10-08 ENCOUNTER — Encounter: Payer: Self-pay | Admitting: Neurology

## 2024-10-09 ENCOUNTER — Inpatient Hospital Stay: Admitting: Hematology and Oncology

## 2024-10-09 ENCOUNTER — Inpatient Hospital Stay: Attending: Hematology and Oncology

## 2024-10-09 VITALS — BP 110/78 | HR 70 | Temp 98.0°F | Resp 17 | Ht 71.0 in | Wt 146.4 lb

## 2024-10-09 DIAGNOSIS — D638 Anemia in other chronic diseases classified elsewhere: Secondary | ICD-10-CM

## 2024-10-09 LAB — CBC WITH DIFFERENTIAL/PLATELET
Abs Immature Granulocytes: 0.01 10*3/uL (ref 0.00–0.07)
Basophils Absolute: 0 10*3/uL (ref 0.0–0.1)
Basophils Relative: 1 %
Eosinophils Absolute: 0.1 10*3/uL (ref 0.0–0.5)
Eosinophils Relative: 2 %
HCT: 28.1 % — ABNORMAL LOW (ref 36.0–46.0)
Hemoglobin: 9.5 g/dL — ABNORMAL LOW (ref 12.0–15.0)
Immature Granulocytes: 0 %
Lymphocytes Relative: 28 %
Lymphs Abs: 1.5 10*3/uL (ref 0.7–4.0)
MCH: 29.9 pg (ref 26.0–34.0)
MCHC: 33.8 g/dL (ref 30.0–36.0)
MCV: 88.4 fL (ref 80.0–100.0)
Monocytes Absolute: 0.3 10*3/uL (ref 0.1–1.0)
Monocytes Relative: 5 %
Neutro Abs: 3.6 10*3/uL (ref 1.7–7.7)
Neutrophils Relative %: 64 %
Platelets: 202 10*3/uL (ref 150–400)
RBC: 3.18 MIL/uL — ABNORMAL LOW (ref 3.87–5.11)
RDW: 12.7 % (ref 11.5–15.5)
WBC: 5.6 10*3/uL (ref 4.0–10.5)
nRBC: 0 % (ref 0.0–0.2)

## 2024-10-09 MED ORDER — PREDNISONE 5 MG PO TABS: 5.0000 mg | ORAL_TABLET | Freq: Every day | ORAL | 0 refills | Status: AC
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
## 2024-10-16 ENCOUNTER — Ambulatory Visit: Admitting: Family Medicine

## 2024-10-21 ENCOUNTER — Ambulatory Visit

## 2024-11-07 ENCOUNTER — Inpatient Hospital Stay: Attending: Hematology and Oncology

## 2024-11-07 ENCOUNTER — Inpatient Hospital Stay: Admitting: Hematology and Oncology

## 2024-11-10 ENCOUNTER — Ambulatory Visit: Admitting: Family Medicine

## 2024-12-19 ENCOUNTER — Ambulatory Visit: Payer: Self-pay | Admitting: Neurology
